# Patient Record
Sex: Female | Born: 1935
Health system: Southern US, Community
[De-identification: ages and names within clinical notes are randomized; demographics above are authoritative.]

## PROBLEM LIST (undated history)

## (undated) DIAGNOSIS — I82409 Acute embolism and thrombosis of unspecified deep veins of unspecified lower extremity: Secondary | ICD-10-CM

## (undated) DIAGNOSIS — E7800 Pure hypercholesterolemia, unspecified: Secondary | ICD-10-CM

## (undated) DIAGNOSIS — F0390 Unspecified dementia without behavioral disturbance: Secondary | ICD-10-CM

## (undated) DIAGNOSIS — C801 Malignant (primary) neoplasm, unspecified: Secondary | ICD-10-CM

## (undated) DIAGNOSIS — E119 Type 2 diabetes mellitus without complications: Secondary | ICD-10-CM

## (undated) DIAGNOSIS — I10 Essential (primary) hypertension: Secondary | ICD-10-CM

## (undated) DIAGNOSIS — Z8639 Personal history of other endocrine, nutritional and metabolic disease: Secondary | ICD-10-CM

## (undated) DIAGNOSIS — E785 Hyperlipidemia, unspecified: Secondary | ICD-10-CM

## (undated) DIAGNOSIS — I639 Cerebral infarction, unspecified: Secondary | ICD-10-CM

## (undated) HISTORY — PX: COLON SURGERY: SHX602

## (undated) HISTORY — PX: CATARACT EXTRACTION, BILATERAL: SHX1313

## (undated) HISTORY — DX: Hyperlipidemia, unspecified: E78.5

## (undated) HISTORY — DX: Unspecified dementia, unspecified severity, without behavioral disturbance, psychotic disturbance, mood disturbance, and anxiety: F03.90

## (undated) HISTORY — PX: OTHER SURGICAL HISTORY: SHX169

## (undated) HISTORY — DX: Cerebral infarction, unspecified: I63.9

## (undated) HISTORY — PX: GALLBLADDER SURGERY: SHX652

## (undated) HISTORY — DX: Essential (primary) hypertension: I10

## (undated) HISTORY — DX: Malignant (primary) neoplasm, unspecified: C80.1

## (undated) HISTORY — DX: Type 2 diabetes mellitus without complications: E11.9

## (undated) HISTORY — DX: Acute embolism and thrombosis of unspecified deep veins of unspecified lower extremity: I82.409

## (undated) HISTORY — PX: HERNIA REPAIR: SHX51

## (undated) HISTORY — DX: Pure hypercholesterolemia, unspecified: E78.00

## (undated) HISTORY — DX: Personal history of other endocrine, nutritional and metabolic disease: Z86.39

---

## 2004-11-12 ENCOUNTER — Ambulatory Visit: Payer: Self-pay | Admitting: Hematology and Oncology

## 2005-01-16 ENCOUNTER — Ambulatory Visit: Payer: Self-pay | Admitting: Hematology and Oncology

## 2005-03-13 ENCOUNTER — Ambulatory Visit: Payer: Self-pay | Admitting: Hematology and Oncology

## 2005-04-18 ENCOUNTER — Ambulatory Visit (HOSPITAL_COMMUNITY): Admission: RE | Admit: 2005-04-18 | Discharge: 2005-04-18 | Payer: Self-pay | Admitting: Hematology and Oncology

## 2005-05-09 ENCOUNTER — Ambulatory Visit: Payer: Self-pay | Admitting: Hematology and Oncology

## 2005-05-14 ENCOUNTER — Encounter: Admission: RE | Admit: 2005-05-14 | Discharge: 2005-05-14 | Payer: Self-pay | Admitting: Hematology and Oncology

## 2005-06-09 ENCOUNTER — Ambulatory Visit (HOSPITAL_COMMUNITY): Admission: RE | Admit: 2005-06-09 | Discharge: 2005-06-09 | Payer: Self-pay | Admitting: Hematology and Oncology

## 2005-07-08 ENCOUNTER — Ambulatory Visit: Payer: Self-pay | Admitting: Hematology and Oncology

## 2005-09-01 ENCOUNTER — Ambulatory Visit: Payer: Self-pay | Admitting: Hematology and Oncology

## 2005-10-21 ENCOUNTER — Ambulatory Visit: Payer: Self-pay | Admitting: Hematology and Oncology

## 2005-12-22 ENCOUNTER — Ambulatory Visit: Payer: Self-pay | Admitting: Hematology and Oncology

## 2005-12-23 LAB — CBC WITH DIFFERENTIAL/PLATELET
BASO%: 1.3 % (ref 0.0–2.0)
LYMPH%: 14 % (ref 14.0–48.0)
MCHC: 33.7 g/dL (ref 32.0–36.0)
MCV: 93 fL (ref 81.0–101.0)
MONO%: 7 % (ref 0.0–13.0)
NEUT%: 76.3 % (ref 39.6–76.8)
Platelets: 156 10*3/uL (ref 145–400)
RBC: 3.58 10*6/uL — ABNORMAL LOW (ref 3.70–5.32)

## 2005-12-23 LAB — CEA: CEA: 2.4 ng/mL (ref 0.0–5.0)

## 2005-12-23 LAB — COMPREHENSIVE METABOLIC PANEL
ALT: 11 U/L (ref 0–40)
AST: 12 U/L (ref 0–37)
BUN: 22 mg/dL (ref 6–23)
CO2: 25 mEq/L (ref 19–32)
Creatinine, Ser: 0.98 mg/dL (ref 0.40–1.20)
Total Bilirubin: 1.1 mg/dL (ref 0.3–1.2)

## 2006-02-12 ENCOUNTER — Ambulatory Visit: Payer: Self-pay | Admitting: Hematology and Oncology

## 2006-04-09 ENCOUNTER — Ambulatory Visit: Payer: Self-pay | Admitting: Hematology and Oncology

## 2006-06-18 ENCOUNTER — Ambulatory Visit: Payer: Self-pay | Admitting: Hematology and Oncology

## 2006-08-12 ENCOUNTER — Ambulatory Visit: Payer: Self-pay | Admitting: Hematology and Oncology

## 2006-09-14 ENCOUNTER — Ambulatory Visit (HOSPITAL_COMMUNITY): Admission: RE | Admit: 2006-09-14 | Discharge: 2006-09-14 | Payer: Self-pay | Admitting: Gastroenterology

## 2006-09-14 ENCOUNTER — Encounter (INDEPENDENT_AMBULATORY_CARE_PROVIDER_SITE_OTHER): Payer: Self-pay | Admitting: Specialist

## 2006-09-14 LAB — HM COLONOSCOPY

## 2006-09-24 ENCOUNTER — Ambulatory Visit: Payer: Self-pay | Admitting: Hematology and Oncology

## 2006-09-28 LAB — CBC WITH DIFFERENTIAL/PLATELET
BASO%: 0.7 % (ref 0.0–2.0)
Basophils Absolute: 0.1 10*3/uL (ref 0.0–0.1)
HCT: 39.3 % (ref 34.8–46.6)
HGB: 13.2 g/dL (ref 11.6–15.9)
LYMPH%: 17.8 % (ref 14.0–48.0)
MCHC: 33.7 g/dL (ref 32.0–36.0)
MONO#: 0.4 10*3/uL (ref 0.1–0.9)
NEUT%: 74 % (ref 39.6–76.8)
Platelets: 202 10*3/uL (ref 145–400)
WBC: 7.2 10*3/uL (ref 3.9–10.0)
lymph#: 1.3 10*3/uL (ref 0.9–3.3)

## 2006-09-29 LAB — COMPREHENSIVE METABOLIC PANEL
ALT: 22 U/L (ref 0–35)
BUN: 17 mg/dL (ref 6–23)
CO2: 27 mEq/L (ref 19–32)
Calcium: 8.7 mg/dL (ref 8.4–10.5)
Chloride: 105 mEq/L (ref 96–112)
Creatinine, Ser: 0.81 mg/dL (ref 0.40–1.20)
Glucose, Bld: 147 mg/dL — ABNORMAL HIGH (ref 70–99)
Total Bilirubin: 1 mg/dL (ref 0.3–1.2)

## 2006-09-29 LAB — LACTATE DEHYDROGENASE: LDH: 161 U/L (ref 94–250)

## 2006-09-29 LAB — CEA: CEA: 1.8 ng/mL (ref 0.0–5.0)

## 2006-11-18 ENCOUNTER — Ambulatory Visit: Payer: Self-pay | Admitting: Hematology and Oncology

## 2007-01-05 ENCOUNTER — Ambulatory Visit: Payer: Self-pay | Admitting: Hematology and Oncology

## 2007-01-05 LAB — CBC WITH DIFFERENTIAL/PLATELET
Basophils Absolute: 0 10*3/uL (ref 0.0–0.1)
EOS%: 2.9 % (ref 0.0–7.0)
Eosinophils Absolute: 0.2 10*3/uL (ref 0.0–0.5)
HCT: 36.1 % (ref 34.8–46.6)
HGB: 12.8 g/dL (ref 11.6–15.9)
LYMPH%: 17.3 % (ref 14.0–48.0)
MCH: 31.5 pg (ref 26.0–34.0)
MCV: 88.9 fL (ref 81.0–101.0)
MONO%: 7.5 % (ref 0.0–13.0)
NEUT#: 4.8 10*3/uL (ref 1.5–6.5)
NEUT%: 72 % (ref 39.6–76.8)
Platelets: 195 10*3/uL (ref 145–400)
RDW: 14.4 % (ref 11.3–14.5)
WBC: 6.7 10*3/uL (ref 3.9–10.0)

## 2007-01-05 LAB — COMPREHENSIVE METABOLIC PANEL
AST: 22 U/L (ref 0–37)
Albumin: 3.8 g/dL (ref 3.5–5.2)
Alkaline Phosphatase: 137 U/L — ABNORMAL HIGH (ref 39–117)
BUN: 15 mg/dL (ref 6–23)
Creatinine, Ser: 0.93 mg/dL (ref 0.40–1.20)
Glucose, Bld: 111 mg/dL — ABNORMAL HIGH (ref 70–99)
Potassium: 4.7 mEq/L (ref 3.5–5.3)
Total Bilirubin: 0.8 mg/dL (ref 0.3–1.2)

## 2007-01-05 LAB — CEA: CEA: 1.8 ng/mL (ref 0.0–5.0)

## 2007-03-31 ENCOUNTER — Ambulatory Visit: Payer: Self-pay | Admitting: Hematology and Oncology

## 2007-05-03 ENCOUNTER — Ambulatory Visit (HOSPITAL_COMMUNITY): Admission: RE | Admit: 2007-05-03 | Discharge: 2007-05-03 | Payer: Self-pay | Admitting: Hematology and Oncology

## 2007-05-03 LAB — CBC WITH DIFFERENTIAL/PLATELET
BASO%: 0 % (ref 0.0–2.0)
HCT: 37 % (ref 34.8–46.6)
HGB: 12.6 g/dL (ref 11.6–15.9)
LYMPH%: 13.3 % — ABNORMAL LOW (ref 14.0–48.0)
MCH: 30.7 pg (ref 26.0–34.0)
MONO%: 7.2 % (ref 0.0–13.0)
NEUT#: 5 10*3/uL (ref 1.5–6.5)
lymph#: 0.9 10*3/uL (ref 0.9–3.3)

## 2007-05-03 LAB — COMPREHENSIVE METABOLIC PANEL
ALT: 28 U/L (ref 0–35)
AST: 22 U/L (ref 0–37)
Alkaline Phosphatase: 66 U/L (ref 39–117)
Creatinine, Ser: 0.99 mg/dL (ref 0.40–1.20)
Total Bilirubin: 0.9 mg/dL (ref 0.3–1.2)

## 2007-05-03 LAB — CEA: CEA: 2.5 ng/mL (ref 0.0–5.0)

## 2007-05-14 ENCOUNTER — Ambulatory Visit: Payer: Self-pay | Admitting: Hematology and Oncology

## 2007-06-24 ENCOUNTER — Ambulatory Visit: Payer: Self-pay | Admitting: Hematology and Oncology

## 2007-07-20 ENCOUNTER — Emergency Department (HOSPITAL_COMMUNITY): Admission: EM | Admit: 2007-07-20 | Discharge: 2007-07-20 | Payer: Self-pay | Admitting: Emergency Medicine

## 2007-08-05 ENCOUNTER — Ambulatory Visit: Payer: Self-pay | Admitting: Hematology and Oncology

## 2007-09-22 ENCOUNTER — Ambulatory Visit: Payer: Self-pay | Admitting: Hematology and Oncology

## 2007-11-02 ENCOUNTER — Ambulatory Visit: Payer: Self-pay | Admitting: Hematology and Oncology

## 2007-11-04 LAB — COMPREHENSIVE METABOLIC PANEL
ALT: 16 U/L (ref 0–35)
Albumin: 3.9 g/dL (ref 3.5–5.2)
Alkaline Phosphatase: 63 U/L (ref 39–117)
CO2: 25 mEq/L (ref 19–32)
Calcium: 9 mg/dL (ref 8.4–10.5)
Total Bilirubin: 0.7 mg/dL (ref 0.3–1.2)
Total Protein: 6.6 g/dL (ref 6.0–8.3)

## 2007-11-04 LAB — CBC WITH DIFFERENTIAL/PLATELET
BASO%: 0.7 % (ref 0.0–2.0)
Basophils Absolute: 0 10*3/uL (ref 0.0–0.1)
Eosinophils Absolute: 0.2 10*3/uL (ref 0.0–0.5)
HCT: 38.9 % (ref 34.8–46.6)
LYMPH%: 13.4 % — ABNORMAL LOW (ref 14.0–48.0)
MCH: 30.6 pg (ref 26.0–34.0)
MCHC: 32.9 g/dL (ref 32.0–36.0)
MCV: 93.1 fL (ref 81.0–101.0)
MONO#: 0.5 10*3/uL (ref 0.1–0.9)
NEUT%: 76.7 % (ref 39.6–76.8)
Platelets: 198 10*3/uL (ref 145–400)
RBC: 4.18 10*6/uL (ref 3.70–5.32)
lymph#: 0.9 10*3/uL (ref 0.9–3.3)

## 2007-11-04 LAB — CEA: CEA: 2.1 ng/mL (ref 0.0–5.0)

## 2007-11-04 LAB — T4, FREE: Free T4: 1.05 ng/dL (ref 0.89–1.80)

## 2008-01-24 ENCOUNTER — Ambulatory Visit: Payer: Self-pay | Admitting: Hematology and Oncology

## 2008-02-21 LAB — HM DEXA SCAN

## 2008-02-21 LAB — HM MAMMOGRAPHY: HM Mammogram: NORMAL

## 2008-04-25 ENCOUNTER — Ambulatory Visit: Payer: Self-pay | Admitting: Hematology and Oncology

## 2008-04-27 LAB — CBC WITH DIFFERENTIAL/PLATELET
BASO%: 0.3 % (ref 0.0–2.0)
HCT: 38.3 % (ref 34.8–46.6)
HGB: 12.8 g/dL (ref 11.6–15.9)
LYMPH%: 15.5 % (ref 14.0–48.0)
MCH: 30.6 pg (ref 26.0–34.0)
MCHC: 33.5 g/dL (ref 32.0–36.0)
MCV: 91.4 fL (ref 81.0–101.0)
MONO%: 7.2 % (ref 0.0–13.0)
NEUT#: 4.4 10*3/uL (ref 1.5–6.5)
RBC: 4.19 10*6/uL (ref 3.70–5.32)
RDW: 13.9 % (ref 11.3–14.5)
WBC: 5.9 10*3/uL (ref 3.9–10.0)
lymph#: 0.9 10*3/uL (ref 0.9–3.3)

## 2008-04-27 LAB — COMPREHENSIVE METABOLIC PANEL
ALT: 21 U/L (ref 0–35)
AST: 19 U/L (ref 0–37)
Alkaline Phosphatase: 60 U/L (ref 39–117)
BUN: 16 mg/dL (ref 6–23)
Calcium: 9 mg/dL (ref 8.4–10.5)
Creatinine, Ser: 0.85 mg/dL (ref 0.40–1.20)
Potassium: 4.2 mEq/L (ref 3.5–5.3)
Total Bilirubin: 0.9 mg/dL (ref 0.3–1.2)

## 2008-04-27 LAB — CEA: CEA: 2.7 ng/mL (ref 0.0–5.0)

## 2008-05-01 ENCOUNTER — Ambulatory Visit (HOSPITAL_COMMUNITY): Admission: RE | Admit: 2008-05-01 | Discharge: 2008-05-01 | Payer: Self-pay | Admitting: Hematology and Oncology

## 2008-08-08 ENCOUNTER — Ambulatory Visit: Payer: Self-pay | Admitting: Hematology and Oncology

## 2008-10-03 ENCOUNTER — Ambulatory Visit: Payer: Self-pay | Admitting: Hematology and Oncology

## 2008-11-28 ENCOUNTER — Ambulatory Visit: Payer: Self-pay | Admitting: Hematology and Oncology

## 2009-01-23 ENCOUNTER — Ambulatory Visit: Payer: Self-pay | Admitting: Hematology and Oncology

## 2009-03-19 ENCOUNTER — Ambulatory Visit: Payer: Self-pay | Admitting: Hematology and Oncology

## 2009-05-02 ENCOUNTER — Ambulatory Visit: Payer: Self-pay | Admitting: Hematology and Oncology

## 2009-06-01 ENCOUNTER — Ambulatory Visit: Payer: Self-pay | Admitting: Hematology and Oncology

## 2009-06-21 LAB — CBC WITH DIFFERENTIAL/PLATELET
BASO%: 0.4 % (ref 0.0–2.0)
Basophils Absolute: 0 10*3/uL (ref 0.0–0.1)
Eosinophils Absolute: 0.1 10*3/uL (ref 0.0–0.5)
HCT: 40.3 % (ref 34.8–46.6)
HGB: 13.6 g/dL (ref 11.6–15.9)
LYMPH%: 17.4 % (ref 14.0–49.7)
MCH: 31.2 pg (ref 25.1–34.0)
MCV: 92.3 fL (ref 79.5–101.0)
MONO#: 0.5 10*3/uL (ref 0.1–0.9)
MONO%: 6.1 % (ref 0.0–14.0)
Platelets: 206 10*3/uL (ref 145–400)
RBC: 4.36 10*6/uL (ref 3.70–5.45)
RDW: 14.2 % (ref 11.2–14.5)
lymph#: 1.3 10*3/uL (ref 0.9–3.3)

## 2009-06-21 LAB — COMPREHENSIVE METABOLIC PANEL
ALT: 15 U/L (ref 0–35)
AST: 12 U/L (ref 0–37)
Alkaline Phosphatase: 81 U/L (ref 39–117)
BUN: 15 mg/dL (ref 6–23)
CO2: 28 mEq/L (ref 19–32)
Calcium: 9.1 mg/dL (ref 8.4–10.5)
Glucose, Bld: 158 mg/dL — ABNORMAL HIGH (ref 70–99)
Total Protein: 6.4 g/dL (ref 6.0–8.3)

## 2009-08-14 ENCOUNTER — Ambulatory Visit: Payer: Self-pay | Admitting: Hematology and Oncology

## 2009-10-12 ENCOUNTER — Ambulatory Visit: Payer: Self-pay | Admitting: Hematology and Oncology

## 2009-12-06 ENCOUNTER — Ambulatory Visit: Payer: Self-pay | Admitting: Hematology and Oncology

## 2010-01-31 ENCOUNTER — Ambulatory Visit: Payer: Self-pay | Admitting: Hematology and Oncology

## 2010-03-26 ENCOUNTER — Ambulatory Visit: Payer: Self-pay | Admitting: Hematology and Oncology

## 2010-05-21 ENCOUNTER — Ambulatory Visit: Payer: Self-pay | Admitting: Hematology and Oncology

## 2010-06-11 ENCOUNTER — Ambulatory Visit (HOSPITAL_COMMUNITY)
Admission: RE | Admit: 2010-06-11 | Discharge: 2010-06-11 | Payer: Self-pay | Source: Home / Self Care | Attending: Hematology and Oncology | Admitting: Hematology and Oncology

## 2010-06-11 LAB — CBC WITH DIFFERENTIAL/PLATELET
BASO%: 0.5 % (ref 0.0–2.0)
Basophils Absolute: 0 10*3/uL (ref 0.0–0.1)
EOS%: 2.5 % (ref 0.0–7.0)
Eosinophils Absolute: 0.1 10*3/uL (ref 0.0–0.5)
HCT: 38.2 % (ref 34.8–46.6)
HGB: 12.5 g/dL (ref 11.6–15.9)
LYMPH%: 19.3 % (ref 14.0–49.7)
MCH: 30.6 pg (ref 25.1–34.0)
MCHC: 32.7 g/dL (ref 31.5–36.0)
MCV: 93.7 fL (ref 79.5–101.0)
MONO#: 0.3 10*3/uL (ref 0.1–0.9)
MONO%: 5.3 % (ref 0.0–14.0)
NEUT#: 4.4 10*3/uL (ref 1.5–6.5)
NEUT%: 72.4 % (ref 38.4–76.8)
Platelets: 164 10*3/uL (ref 145–400)
RBC: 4.07 10*6/uL (ref 3.70–5.45)
RDW: 14.2 % (ref 11.2–14.5)
WBC: 6 10*3/uL (ref 3.9–10.3)
lymph#: 1.2 10*3/uL (ref 0.9–3.3)

## 2010-06-11 LAB — CMP (CANCER CENTER ONLY)
ALT(SGPT): 23 U/L (ref 10–47)
AST: 20 U/L (ref 11–38)
Albumin: 3.1 g/dL — ABNORMAL LOW (ref 3.3–5.5)
Alkaline Phosphatase: 59 U/L (ref 26–84)
BUN, Bld: 14 mg/dL (ref 7–22)
CO2: 30 mEq/L (ref 18–33)
Calcium: 8.8 mg/dL (ref 8.0–10.3)
Chloride: 100 mEq/L (ref 98–108)
Creat: 0.8 mg/dl (ref 0.6–1.2)
Glucose, Bld: 114 mg/dL (ref 73–118)
Potassium: 4.3 mEq/L (ref 3.3–4.7)
Sodium: 146 mEq/L — ABNORMAL HIGH (ref 128–145)
Total Bilirubin: 0.8 mg/dl (ref 0.20–1.60)
Total Protein: 6.7 g/dL (ref 6.4–8.1)

## 2010-06-15 ENCOUNTER — Other Ambulatory Visit: Payer: Self-pay | Admitting: Hematology and Oncology

## 2010-06-15 ENCOUNTER — Encounter: Payer: Self-pay | Admitting: Hematology and Oncology

## 2010-06-15 DIAGNOSIS — C20 Malignant neoplasm of rectum: Secondary | ICD-10-CM

## 2010-06-16 ENCOUNTER — Encounter: Payer: Self-pay | Admitting: Family Medicine

## 2010-06-16 ENCOUNTER — Encounter: Payer: Self-pay | Admitting: Hematology and Oncology

## 2010-07-03 ENCOUNTER — Encounter (HOSPITAL_BASED_OUTPATIENT_CLINIC_OR_DEPARTMENT_OTHER): Payer: MEDICARE | Admitting: Hematology and Oncology

## 2010-07-03 DIAGNOSIS — C20 Malignant neoplasm of rectum: Secondary | ICD-10-CM

## 2010-07-03 DIAGNOSIS — Z452 Encounter for adjustment and management of vascular access device: Secondary | ICD-10-CM

## 2010-08-14 ENCOUNTER — Encounter (HOSPITAL_BASED_OUTPATIENT_CLINIC_OR_DEPARTMENT_OTHER): Payer: MEDICARE | Admitting: Hematology and Oncology

## 2010-08-14 DIAGNOSIS — Z452 Encounter for adjustment and management of vascular access device: Secondary | ICD-10-CM

## 2010-08-14 DIAGNOSIS — C20 Malignant neoplasm of rectum: Secondary | ICD-10-CM

## 2010-10-11 NOTE — Op Note (Signed)
NAMEELLIONA, DODDRIDGE                  ACCOUNT NO.:  [id number]   MEDICAL RECORD NO.:  [id number]          PATIENT TYPE:  AMB   LOCATION:  ENDO                         FACILITY:  [facility]   PHYSICIAN:  Anselmo Rod, M.D.  DATE OF BIRTH:  27-Feb-1934   DATE OF PROCEDURE:  09/14/2006  DATE OF DISCHARGE:                               OPERATIVE REPORT   PROCEDURE PERFORMED:  Screening colonoscopy.   ENDOSCOPIST:  Anselmo Rod, M.D.   INSTRUMENT USED:  Pentax video colonoscope.   INDICATIONS FOR PROCEDURE:  A 77 year old female with a personal history  of rectal cancer, status post low anterior resection, undergoing a  surveillance colonoscopy.  Her colon cancer was initially diagnosed in  2004 when she had surgery in [place].  Rule out recurrent polyps,  masses, etc.   PRE-PROCEDURE PREPARATION:  Informed consent was procured from the  patient.  The patient was fasted for eight hours prior to the procedure  and prepped with a gallon of NuLytely the night prior to the procedure.  She also received a bottle of magnesium citrate along with a gallon of  NuLytely.  Risks and benefits of the procedure, including a 10% missed  rate of cancer and polyps, were discussed with the patient as well.   PRE-PROCEDURE PHYSICAL EXAMINATION:  VITAL SIGNS:  Stable vital signs.  NECK:  Supple.  CHEST:  Clear to auscultation.  HEART:  S1 and S2.  Regular.  ABDOMEN:  Obese with normal bowel sounds.   DESCRIPTION OF PROCEDURE:  The patient was placed in the left lateral  decubitus position and sedated with 50 mcg of Fentanyl and 5 mg of  Versed given intravenously in slow, incremental doses.  Once the patient  was adequately sedated and maintained on low flow oxygen and continuous  cardiac monitoring, the Pentax video colonoscope was advanced from the  rectum to the cecum, where healthy anastomosis was seen at 5 cm.  A  small sessile polyp was removed via snare polypectomy at 6 cm.  There  was a large  amount of residual stool in the colon.  The prep was poor.  There were a few early scattered diverticula noted.  Retroflexion in the  rectum revealed no abnormalities.  The patient tolerated the procedure  well without immediate complications.   IMPRESSION:  1. A few scattered diverticula.  2. A small sessile polyp, removed by a snare polypectomy at 6 cm.  3. Healthy anastomosis at 5 cm.  4. A large amount of residual stool in the colon.  Small lesions could      be missed.   RECOMMENDATIONS:  1. Await pathology results.  2. Avoid all nonsteroidals, including aspirin, for the next two weeks.  3. Repeat colonoscopy, depending on pathology results.  4. Outpatient followup as the needs arise in the future.  The      importance of a high-fiber diet has been discussed with the      patient.      Anselmo Rod, M.D.  Electronically Signed     JNM/MEDQ  D:  09/15/2006  T:  09/16/2006  Job:  [id number]   cc:   Ernestina Penna, M.D.  Lauretta I. Odogwu, M.D.

## 2010-10-14 ENCOUNTER — Encounter (HOSPITAL_BASED_OUTPATIENT_CLINIC_OR_DEPARTMENT_OTHER): Payer: Medicare Other | Admitting: Hematology and Oncology

## 2010-10-14 DIAGNOSIS — C20 Malignant neoplasm of rectum: Secondary | ICD-10-CM

## 2010-10-14 DIAGNOSIS — Z452 Encounter for adjustment and management of vascular access device: Secondary | ICD-10-CM

## 2010-10-17 ENCOUNTER — Other Ambulatory Visit: Payer: Self-pay | Admitting: Hematology and Oncology

## 2010-10-17 DIAGNOSIS — C20 Malignant neoplasm of rectum: Secondary | ICD-10-CM

## 2010-10-31 ENCOUNTER — Ambulatory Visit (HOSPITAL_COMMUNITY)
Admission: RE | Admit: 2010-10-31 | Discharge: 2010-10-31 | Disposition: A | Discharge status: Home / Self Care | Payer: Medicare Other | Source: Ambulatory Visit | Attending: Hematology and Oncology | Admitting: Hematology and Oncology

## 2010-10-31 DIAGNOSIS — Z452 Encounter for adjustment and management of vascular access device: Secondary | ICD-10-CM | POA: Insufficient documentation

## 2010-10-31 DIAGNOSIS — C20 Malignant neoplasm of rectum: Secondary | ICD-10-CM

## 2010-10-31 DIAGNOSIS — C189 Malignant neoplasm of colon, unspecified: Secondary | ICD-10-CM | POA: Insufficient documentation

## 2010-10-31 LAB — CBC
HCT: 41.2 % (ref 36.0–46.0)
MCV: 93.8 fL (ref 78.0–100.0)
RDW: 13.6 % (ref 11.5–15.5)

## 2011-02-14 LAB — POCT I-STAT CREATININE: Operator id: 284251

## 2011-02-14 LAB — I-STAT 8, (EC8 V) (CONVERTED LAB)
Acid-Base Excess: 2
Bicarbonate: 27.4 — ABNORMAL HIGH
TCO2: 29
pCO2, Ven: 44.3 — ABNORMAL LOW
pH, Ven: 7.399 — ABNORMAL HIGH

## 2011-02-14 LAB — CBC
HCT: 39.8
Hemoglobin: 13.4
MCHC: 33.7
MCV: 91.6
Platelets: 209
RBC: 4.35
RDW: 13.7
WBC: 6.9

## 2011-02-14 LAB — DIFFERENTIAL
Basophils Absolute: 0
Basophils Relative: 0
Eosinophils Absolute: 0.1
Eosinophils Relative: 2
Lymphocytes Relative: 12
Lymphs Abs: 0.8
Monocytes Absolute: 0.4
Monocytes Relative: 6
Neutro Abs: 5.5
Neutrophils Relative %: 80 — ABNORMAL HIGH

## 2011-02-14 LAB — URINALYSIS, ROUTINE W REFLEX MICROSCOPIC
Bilirubin Urine: NEGATIVE
Glucose, UA: NEGATIVE
Hgb urine dipstick: NEGATIVE
Ketones, ur: NEGATIVE
Nitrite: NEGATIVE
Protein, ur: NEGATIVE
Specific Gravity, Urine: 1.015
Urobilinogen, UA: 1
pH: 7.5

## 2011-05-26 ENCOUNTER — Telehealth: Payer: Self-pay | Admitting: Hematology and Oncology

## 2011-05-26 NOTE — Telephone Encounter (Signed)
called pts home lmovm that her appt on 01/18 was r/s to 02/05 and to rtn call to me to confirm appt

## 2011-06-10 ENCOUNTER — Inpatient Hospital Stay (HOSPITAL_COMMUNITY): Admission: RE | Admit: 2011-06-10 | Payer: Self-pay | Source: Ambulatory Visit

## 2011-06-10 ENCOUNTER — Other Ambulatory Visit: Payer: Medicare Other

## 2011-06-23 ENCOUNTER — Encounter: Payer: Self-pay | Admitting: Nurse Practitioner

## 2011-07-01 ENCOUNTER — Ambulatory Visit: Payer: Medicare Other | Admitting: Hematology and Oncology

## 2012-09-02 ENCOUNTER — Telehealth: Payer: Self-pay | Admitting: Family Medicine

## 2012-09-02 NOTE — Telephone Encounter (Signed)
Daughter wants Leler Iribe to see neuro at [facility], stating pt seems to be disoriented at times and hands shaky. Number to call is [phone]. °

## 2012-09-02 NOTE — Telephone Encounter (Signed)
I am fine with that but I'd like to see her tomorrow to begin the necessary work up (TSH, B12, MRI of brain to evaluate for stroke) that way they have the info when they see her.  NTBS tomorrow or next week.

## 2012-09-02 NOTE — Telephone Encounter (Signed)
Daughter wants Terika Fangman to see neuro at [facility], stating pt seems to be disoriented at times and hands shaky. Number to call is [phone].

## 2012-09-02 NOTE — Telephone Encounter (Signed)
Pt aware and daughter will getting in touch with Jaquelinne and give [place] a call to make appt./SS

## 2012-09-06 NOTE — Telephone Encounter (Signed)
Pt has appt with [facility] set up

## 2012-09-21 ENCOUNTER — Encounter: Payer: Self-pay | Admitting: Family Medicine

## 2012-09-21 ENCOUNTER — Ambulatory Visit (INDEPENDENT_AMBULATORY_CARE_PROVIDER_SITE_OTHER): Payer: Medicare Other | Admitting: Family Medicine

## 2012-09-21 VITALS — BP 132/78 | HR 92 | Temp 98.1°F | Resp 20 | Wt 286.0 lb

## 2012-09-21 DIAGNOSIS — L0293 Carbuncle, unspecified: Secondary | ICD-10-CM

## 2012-09-21 DIAGNOSIS — E1122 Type 2 diabetes mellitus with diabetic chronic kidney disease: Secondary | ICD-10-CM | POA: Insufficient documentation

## 2012-09-21 DIAGNOSIS — G459 Transient cerebral ischemic attack, unspecified: Secondary | ICD-10-CM

## 2012-09-21 DIAGNOSIS — E785 Hyperlipidemia, unspecified: Secondary | ICD-10-CM

## 2012-09-21 DIAGNOSIS — L0292 Furuncle, unspecified: Secondary | ICD-10-CM

## 2012-09-21 DIAGNOSIS — I10 Essential (primary) hypertension: Secondary | ICD-10-CM

## 2012-09-21 DIAGNOSIS — R413 Other amnesia: Secondary | ICD-10-CM

## 2012-09-21 LAB — HEPATIC FUNCTION PANEL
AST: 13 U/L (ref 0–37)
Albumin: 3.9 g/dL (ref 3.5–5.2)
Alkaline Phosphatase: 90 U/L (ref 39–117)
Indirect Bilirubin: 0.8 mg/dL (ref 0.0–0.9)
Total Bilirubin: 0.9 mg/dL (ref 0.3–1.2)
Total Protein: 6.8 g/dL (ref 6.0–8.3)

## 2012-09-21 LAB — LIPID PANEL
LDL Cholesterol: 190 mg/dL — ABNORMAL HIGH (ref 0–99)
Total CHOL/HDL Ratio: 6.1 Ratio

## 2012-09-21 LAB — BASIC METABOLIC PANEL
BUN: 14 mg/dL (ref 6–23)
CO2: 25 mEq/L (ref 19–32)
Chloride: 103 mEq/L (ref 96–112)
Creat: 0.74 mg/dL (ref 0.50–1.10)
Potassium: 4.3 mEq/L (ref 3.5–5.3)

## 2012-09-21 MED ORDER — SULFAMETHOXAZOLE-TRIMETHOPRIM 800-160 MG PO TABS: 1.0000 | ORAL_TABLET | Freq: Two times a day (BID) | ORAL | Status: DC

## 2012-09-21 MED ORDER — LISINOPRIL 20 MG PO TABS: 20.0000 mg | ORAL_TABLET | Freq: Every day | ORAL | Status: DC

## 2012-09-21 NOTE — Progress Notes (Signed)
Subjective:    Patient ID: Christine Zavala, female    DOB: August 18, 1933, 79 y.o.   MRN: [id number]  HPI Patient is accompanied by her daughter. There is some concern that the patient is having significant memory loss.  This seems to be a sudden change. For instance, 2 weeks ago the patient tried to write her name, but could not think of how to write her name.  Furthermore she is forgetting to take her medicines. She discontinued metformin accident.  In September due to the elevated LDL, we would switch her from pravastatin to Crestor. The patient never went to get that prescription because she forgot. She forgetting names. She is become easily confused.  The daughter is concerned she may be having mini strokes.  She has a history of allergies to aspirin.  Her risk factors for strokes including hypertension, diabetes mellitus, hyperlipidemia. She is not checking her sugars. Her last hemoglobin A1c was 6.9 in September of 2013 .  Her blood pressure is fairly well controlled. Her cholesterol her last office visit was significant for an LDL of 176.    She also has a boil in her left groin. It is red, sore, and draining purulent material. His approximate 1.5 cm in size. There is a 0.5 cm at the which is draining yellow material in the center. Past Medical History  Diagnosis Date  . Cancer     Rectal  . Osteoporosis   . DVT (deep venous thrombosis)   . Cataract   . Hyperlipidemia   . Hypertension   . Diabetes mellitus without complication    Current Outpatient Prescriptions on File Prior to Visit  Medication Sig Dispense Refill  . furosemide (LASIX) 40 MG tablet Take 40 mg by mouth 2 (two) times daily.      . potassium chloride SA (K-DUR,KLOR-CON) 20 MEQ tablet Take 20 mEq by mouth daily.      . pravastatin (PRAVACHOL) 10 MG tablet Take 10 mg by mouth 2 (two) times daily.      . vitamin B-12 (CYANOCOBALAMIN) 1000 MCG tablet Take 1,000 mcg by mouth daily.       No current facility-administered  medications on file prior to visit.   Allergies  Allergen Reactions  . Aspirin Nausea And Vomiting  . Codeine Sulfate Nausea And Vomiting   History   Social History  . Marital Status: Widowed    Spouse Name: N/A    Number of Children: N/A  . Years of Education: N/A   Occupational History  . Not on file.   Social History Main Topics  . Smoking status: Never Smoker   . Smokeless tobacco: Not on file  . Alcohol Use: No  . Drug Use: Not on file  . Sexually Active: Not on file   Other Topics Concern  . Not on file   Social History Narrative  . No narrative on file      Review of Systems Remainder of review of systems is negative.    Objective:   Physical Exam  Constitutional: She is oriented to person, place, and time. She appears well-developed and well-nourished.  Cardiovascular: Normal rate, regular rhythm and normal heart sounds.   No murmur heard. Pulmonary/Chest: Effort normal and breath sounds normal. No respiratory distress. She has no wheezes. She has no rales. She exhibits no tenderness.  Abdominal: Soft. Bowel sounds are normal. She exhibits no distension and no mass. There is no tenderness. There is no rebound and no guarding.  Neurological: She is alert  and oriented to person, place, and time. She has normal reflexes. She displays normal reflexes. No cranial nerve deficit. She exhibits normal muscle tone. Coordination normal.   there is no apparent cranial nerve deficit. She has a slight left pronator drift. There is a boil in her left groin as described above.        Assessment & Plan:  1. HLD (hyperlipidemia) Check fasting lipid panel, goal LDL is less than 70. - Hepatic Function Panel - Lipid Panel - MR Brain W Wo Contrast; Future  2. Type II or unspecified type diabetes mellitus without mention of complication, uncontrolled Goal A1c is less than 6.5, check hemoglobin A1c - Basic Metabolic Panel - Hemoglobin A1c - Hepatic Function Panel -  Lipid Panel - MR Brain W Wo Contrast; Future  3. Boil Bactrim double strength tablets 1 by mouth twice a day for 10 days  4. HTN (hypertension) Blood pressure is currently well controlled, continue present medications at present does - MR Brain W Wo Contrast; Future  5. Memory loss This could reflect dementia versus vascular dementia. Obtain MRI of the brain. If there is evidence of recent cerebral infarction, she will need a vascular workup including carotid Dopplers, echocardiogram of the heart. Also start patient on Plavix 75 mg by mouth daily given her history of an allergy to aspirin.  If there is no evidence of a CVA, would treat the patient as dementia with Aricept and namenda. - MR Brain W Wo Contrast; Future  6. TIA (transient ischemic attack) Her symptoms are not classic for TIA. Forgetting how to write her name could represent aphasia.  However it could also just be a symptom of her dementia. Await results of her MRI.  If MRIs showed silent CVAs, she will need a vascular workup as I have discussed earlier.  - MR Brain W Wo Contrast; Future

## 2012-09-22 ENCOUNTER — Other Ambulatory Visit: Payer: Self-pay | Admitting: Family Medicine

## 2012-09-22 MED ORDER — ROSUVASTATIN CALCIUM 40 MG PO TABS: 40.0000 mg | ORAL_TABLET | Freq: Every day | ORAL | Status: DC

## 2012-09-22 MED ORDER — METFORMIN HCL 500 MG PO TABS: 1000.0000 mg | ORAL_TABLET | Freq: Two times a day (BID) | ORAL | Status: DC

## 2012-09-22 NOTE — Telephone Encounter (Signed)
Rx Refilled  

## 2012-09-23 ENCOUNTER — Other Ambulatory Visit: Payer: Self-pay | Admitting: Family Medicine

## 2012-09-26 ENCOUNTER — Ambulatory Visit
Admission: RE | Admit: 2012-09-26 | Discharge: 2012-09-26 | Disposition: A | Discharge status: Home / Self Care | Payer: Medicare Other | Source: Ambulatory Visit | Attending: Family Medicine | Admitting: Family Medicine

## 2012-09-26 DIAGNOSIS — E785 Hyperlipidemia, unspecified: Secondary | ICD-10-CM

## 2012-09-26 DIAGNOSIS — G459 Transient cerebral ischemic attack, unspecified: Secondary | ICD-10-CM

## 2012-09-26 DIAGNOSIS — R413 Other amnesia: Secondary | ICD-10-CM

## 2012-09-26 DIAGNOSIS — I10 Essential (primary) hypertension: Secondary | ICD-10-CM

## 2012-09-26 MED ORDER — GADOBENATE DIMEGLUMINE 529 MG/ML IV SOLN
20.0000 mL | Freq: Once | INTRAVENOUS | Status: AC | PRN
  Administered 2012-09-26: 20 mL via INTRAVENOUS

## 2012-09-27 ENCOUNTER — Other Ambulatory Visit: Payer: Self-pay | Admitting: Family Medicine

## 2012-09-27 DIAGNOSIS — R413 Other amnesia: Secondary | ICD-10-CM

## 2012-09-27 DIAGNOSIS — I639 Cerebral infarction, unspecified: Secondary | ICD-10-CM

## 2012-09-27 MED ORDER — LISINOPRIL 40 MG PO TABS: 40.0000 mg | ORAL_TABLET | Freq: Every day | ORAL | Status: DC

## 2012-09-27 MED ORDER — CLOPIDOGREL BISULFATE 75 MG PO TABS: 75.0000 mg | ORAL_TABLET | Freq: Every day | ORAL | Status: DC

## 2012-09-27 MED ORDER — DONEPEZIL HCL 5 MG PO TABS: 5.0000 mg | ORAL_TABLET | Freq: Every evening | ORAL | Status: DC | PRN

## 2012-09-27 NOTE — Telephone Encounter (Signed)
Rx Refilled  

## 2012-09-29 ENCOUNTER — Telehealth: Payer: Self-pay | Admitting: Family Medicine

## 2012-09-30 ENCOUNTER — Telehealth: Payer: Self-pay | Admitting: Family Medicine

## 2012-09-30 NOTE — Telephone Encounter (Signed)
Not likely due to Plavix. Aricept can cause some stomach upset. Try taking the Aricept at night with food. Metformin and Crestor should not cause nausea and vomiting. Metformin may cause diarrhea.

## 2012-09-30 NOTE — Telephone Encounter (Signed)
.  Patient aware and will continue to take meds and if it does not improve in a 1 week she will call us back

## 2012-10-06 ENCOUNTER — Telehealth: Payer: Self-pay | Admitting: *Deleted

## 2012-10-19 ENCOUNTER — Encounter: Payer: Self-pay | Admitting: Neurology

## 2012-10-19 ENCOUNTER — Ambulatory Visit (INDEPENDENT_AMBULATORY_CARE_PROVIDER_SITE_OTHER): Payer: Medicare Other | Admitting: Neurology

## 2012-10-19 VITALS — BP 153/76 | HR 85 | Ht 64.0 in | Wt 274.0 lb

## 2012-10-19 DIAGNOSIS — I6350 Cerebral infarction due to unspecified occlusion or stenosis of unspecified cerebral artery: Secondary | ICD-10-CM

## 2012-10-19 DIAGNOSIS — Z85048 Personal history of other malignant neoplasm of rectum, rectosigmoid junction, and anus: Secondary | ICD-10-CM | POA: Insufficient documentation

## 2012-10-19 DIAGNOSIS — F0390 Unspecified dementia without behavioral disturbance: Secondary | ICD-10-CM

## 2012-10-19 DIAGNOSIS — C801 Malignant (primary) neoplasm, unspecified: Secondary | ICD-10-CM

## 2012-10-19 DIAGNOSIS — I639 Cerebral infarction, unspecified: Secondary | ICD-10-CM

## 2012-10-19 DIAGNOSIS — E785 Hyperlipidemia, unspecified: Secondary | ICD-10-CM | POA: Insufficient documentation

## 2012-10-19 DIAGNOSIS — E7800 Pure hypercholesterolemia, unspecified: Secondary | ICD-10-CM

## 2012-10-19 DIAGNOSIS — Z8673 Personal history of transient ischemic attack (TIA), and cerebral infarction without residual deficits: Secondary | ICD-10-CM | POA: Insufficient documentation

## 2012-10-19 NOTE — Progress Notes (Signed)
HPI: Christine Zavala is a 79 years old right-handed Caucasian female, referred by her primary care physician Dr. Lynnea Ferrier,  accompanied by her daughter at today's clinical visit.  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  She was noticed by her family, and also herself notice that she has mild memory trouble for 7 years, difficulty focusing, difficulty with people's name,  Her memory trouble gradually getting worse, she had 14 years of education, retired at [employer] at age 79 due to her multiple joints pain, She complains of bilateral hip pain, knee pain, gait difficulty,  She was referred for MRI of the brain, which reviewed the film together, there was left frontal subcortical chronic stroke, no acute lesions, she is taking Plavix, no strokelike events   Review of Systems  Out of a complete 14 system review, the patient complains of only the following symptoms, and all other reviewed systems are negative.   Constitutional:   Weight loss, fatigue Cardiovascular:  Swelling in legs Ear/Nose/Throat:  N/A Skin: N/A Eyes: blurry vision Respiratory: N/A Gastroitestinal:  Incontinence, constipation   Hematology/Lymphatic:  N/A Endocrine:  N/A Musculoskeletal: joints pain, swelling Allergy/Immunology: N/A Neurological:  Memory loss, confusion, numbness, weakness, dizziness, sleepiness, Psychiatric:    Not enough sleep, change in appetite, disinterested in activities,  PHYSICAL EXAMINATOINS:  Generalized: In no acute distress  Neck: Supple, no carotid bruits   Cardiac: Regular rate rhythm  Pulmonary: Clear to auscultation bilaterally  Musculoskeletal: No  deformity  Neurological examination  Mentation: Alert oriented to time, place, history taking, and causual conversation, obese, Mini-Mental Status Exmination is 29 out of 30,  she missed 1 out of 3 recalls, animal naming was 8,  Cranial nerve II-XII: Pupils were equal round reactive to light extraocular movements were full, visual field were full on confrontational test. facial sensation and strength were normal. hearing was intact to finger rubbing bilaterally. Uvula tongue midline.  head turning and shoulder shrug and were normal and symmetric.Tongue protrusion into cheek strength was normal.  Motor: normal tone, bulk and strength.  Sensory: Intact to fine touch, pinprick, preserved vibratory sensation, and proprioception at toes.  Coordination: Normal finger to nose, heel-to-shin bilaterally there was no truncal ataxia  Gait: Rising up from seated position by pushing on chair arm, unsteady wide based, wobbling gait, she could not perform tiptoe, heel, and tandem walking  Romberg signs: Negative  Deep tendon reflexes: Brachioradialis 2/2, biceps 2/2, triceps 2/2, patellar 2/2, Achilles trace, plantar responses were flexor bilaterally.  Assessment and plan:  79 years old right-handed Caucasian female, with past medical history of obesity, hypertension, hyperlipidemia, diabetes, colon cancer, status post chemoradiation, and resection surgery, family history of dementia, she presented with memory trouble, also gait difficulty, MRI showed left frontal subcortical chronic stroke  1 her gait difficulty multifactor, including obesity, sedentary lifestyle, deconditioning, 2 home physical therapy 3 complete evaluation with ultrasound of carotid artery, echocardiogram, 4 continue Plavix, address vascular risk factor.

## 2012-10-20 NOTE — Telephone Encounter (Signed)
Referral scheduled.

## 2012-10-22 ENCOUNTER — Telehealth: Payer: Self-pay | Admitting: Family Medicine

## 2012-10-22 MED ORDER — BLOOD GLUCOSE MONITOR KIT: PACK | Status: DC

## 2012-10-22 NOTE — Telephone Encounter (Signed)
Rx Refilled  

## 2012-10-26 ENCOUNTER — Telehealth: Payer: Self-pay | Admitting: Neurology

## 2012-11-02 ENCOUNTER — Telehealth: Payer: Self-pay | Admitting: Neurology

## 2012-11-02 ENCOUNTER — Ambulatory Visit (INDEPENDENT_AMBULATORY_CARE_PROVIDER_SITE_OTHER): Payer: Medicare Other

## 2012-11-02 DIAGNOSIS — I6350 Cerebral infarction due to unspecified occlusion or stenosis of unspecified cerebral artery: Secondary | ICD-10-CM

## 2012-11-02 DIAGNOSIS — E7800 Pure hypercholesterolemia, unspecified: Secondary | ICD-10-CM

## 2012-11-02 DIAGNOSIS — C801 Malignant (primary) neoplasm, unspecified: Secondary | ICD-10-CM

## 2012-11-02 DIAGNOSIS — F0390 Unspecified dementia without behavioral disturbance: Secondary | ICD-10-CM

## 2012-11-02 DIAGNOSIS — I639 Cerebral infarction, unspecified: Secondary | ICD-10-CM

## 2012-11-02 NOTE — Telephone Encounter (Signed)
Returned call, no answer. Left vmail.  

## 2012-11-03 ENCOUNTER — Encounter: Payer: Self-pay | Admitting: Family Medicine

## 2012-11-03 ENCOUNTER — Telehealth: Payer: Self-pay | Admitting: Neurology

## 2012-11-03 ENCOUNTER — Ambulatory Visit (INDEPENDENT_AMBULATORY_CARE_PROVIDER_SITE_OTHER): Payer: Medicare Other | Admitting: Family Medicine

## 2012-11-03 VITALS — BP 120/60 | HR 64 | Temp 97.6°F | Resp 16 | Wt 270.0 lb

## 2012-11-03 DIAGNOSIS — E119 Type 2 diabetes mellitus without complications: Secondary | ICD-10-CM

## 2012-11-03 DIAGNOSIS — F0390 Unspecified dementia without behavioral disturbance: Secondary | ICD-10-CM

## 2012-11-03 DIAGNOSIS — R5381 Other malaise: Secondary | ICD-10-CM

## 2012-11-03 DIAGNOSIS — R110 Nausea: Secondary | ICD-10-CM

## 2012-11-03 DIAGNOSIS — I10 Essential (primary) hypertension: Secondary | ICD-10-CM

## 2012-11-03 DIAGNOSIS — R5383 Other fatigue: Secondary | ICD-10-CM | POA: Insufficient documentation

## 2012-11-03 DIAGNOSIS — IMO0001 Reserved for inherently not codable concepts without codable children: Secondary | ICD-10-CM

## 2012-11-03 DIAGNOSIS — M791 Myalgia, unspecified site: Secondary | ICD-10-CM

## 2012-11-03 DIAGNOSIS — E559 Vitamin D deficiency, unspecified: Secondary | ICD-10-CM

## 2012-11-03 DIAGNOSIS — R634 Abnormal weight loss: Secondary | ICD-10-CM

## 2012-11-03 NOTE — Assessment & Plan Note (Addendum)
I will decrease her to 1500 mg total. She was on 1000mg  daily, see instructions

## 2012-11-03 NOTE — Assessment & Plan Note (Signed)
Will hold aricept based on pt concerns with new medication, plavix will be continued as old stroke noted with likely vascular causes of dementia

## 2012-11-03 NOTE — Assessment & Plan Note (Signed)
Is very difficult to tell which medications are causing her nausea that she has many which have that side effect. She truly thinks that the Aricept causes a lot of upset stomach. I will go ahead and stop the Aricept right now she will followup with her PCP in one week. She's not had any emesis. Metformin can also cause some GI side effects other mostly diarrhea. CBC and see met will be obtained

## 2012-11-03 NOTE — Assessment & Plan Note (Signed)
Her myalgias may be multifactorial. Since she was changed from pravastatin to Crestor I will have her hold this for right now. Will check a CK levels as well as renal function liver function

## 2012-11-03 NOTE — Patient Instructions (Addendum)
Stop the crestor  Take 1/2 tablet in the morning of Metformin , take 1 at bedtime Continue plavix Stop  aricept Blood work done  Keep drinking fluids  F/U 1 week

## 2012-11-03 NOTE — Assessment & Plan Note (Addendum)
See above notes, Metformin can also cause the weight loss Labs to be done  History of rectal cancer Changes per above, appetite has also been low contributing to weight loss

## 2012-11-03 NOTE — Assessment & Plan Note (Signed)
Blood pressure rechecked in office is at her baseline. I will continue the current dose of lisinopril

## 2012-11-03 NOTE — Progress Notes (Signed)
  Subjective:    Patient ID: Christine Zavala, female    DOB: 03/13/1934, 79 y.o.   MRN: [id number]  HPI Patient here secondary to increased fatigue muscle aches  nausea. She thinks this is due to her medication changes recently. At the end of April she was seen by her PCP. She was found to have changes in her mentation and cognition concerned for dementia MRI showed old stroke. She was started on Plavix and Aricept. She is currently being seen by neurology as well for dementia. Blood work was obtained which showed uncontrolled diabetes mellitus her metformin was increased to 1000 mg twice a day. She states her blood sugars have been on the lower in in the low 100s, but denies any hypoglycemia or diarrhea. Her fasting lipid panel was also extremely elevated and she was changed from pravastatin to Crestor. She states she is so fatigued she is unable to get up and move around her house at her baseline. Her appetite is also decreased and she has lost 16 pounds since the end of April which is been about 6 weeks. Muscles aches are mostly at night and in early AM Denies any fall  Review of Systems  GEN- +fatigue, fever, weight loss,weakness, recent illness HEENT- denies eye drainage, change in vision, nasal discharge, CVS- denies chest pain, palpitations RESP- denies SOB, cough, wheeze ABD- + N/denies V , change in stools, abd pain GU- denies dysuria, hematuria, dribbling, incontinence MSK- denies joint pain,+ muscle aches, injury Neuro- denies headache, dizziness, syncope, seizure activity      Objective:   Physical Exam GEN- NAD, alert and orientedx3, elderly female HEENT- PERRL, EOMI, non injected sclera, pink conjunctiva, MMM, oropharynx clear Neck- Supple, no LAD CVS- RRR, no murmur RESP-CTAB ABD-NABS,soft,NT,ND, multiple healed surgical scars EXT- trace pedal edema, non tender to palpation of LE Pulses- Radial 2+ DP equal bilat NEURO-CNII-XII in tact, ambulates slowly and cautiously with cane,  no shuffling feet, normal tone LE and UE, no rigidity,          Assessment & Plan:

## 2012-11-03 NOTE — Assessment & Plan Note (Signed)
Her above based on her history this is likely multiple factorial however she has had multiple changes in her medications. This could be from the increased dose of metformin which is due to cause nausea as well as the lower blood sugars which she may not be used to. We will continue the Plavix. B12 levels will be checked as well as vitamin D at patient's request

## 2012-11-04 LAB — COMPREHENSIVE METABOLIC PANEL
ALT: 79 U/L — ABNORMAL HIGH (ref 0–35)
Albumin: 4 g/dL (ref 3.5–5.2)
Alkaline Phosphatase: 72 U/L (ref 39–117)
Glucose, Bld: 135 mg/dL — ABNORMAL HIGH (ref 70–99)
Potassium: 4.3 mEq/L (ref 3.5–5.3)
Sodium: 139 mEq/L (ref 135–145)
Total Bilirubin: 0.8 mg/dL (ref 0.3–1.2)
Total Protein: 6.3 g/dL (ref 6.0–8.3)

## 2012-11-04 LAB — CBC WITH DIFFERENTIAL/PLATELET
Basophils Absolute: 0 10*3/uL (ref 0.0–0.1)
Basophils Relative: 0 % (ref 0–1)
Eosinophils Absolute: 0.1 10*3/uL (ref 0.0–0.7)
Eosinophils Relative: 1 % (ref 0–5)
Lymphs Abs: 1.4 10*3/uL (ref 0.7–4.0)
MCH: 28.7 pg (ref 26.0–34.0)
MCHC: 32.6 g/dL (ref 30.0–36.0)
MCV: 88.1 fL (ref 78.0–100.0)
Neutrophils Relative %: 79 % — ABNORMAL HIGH (ref 43–77)
Platelets: 270 10*3/uL (ref 150–400)
RBC: 4.11 MIL/uL (ref 3.87–5.11)
RDW: 14.9 % (ref 11.5–15.5)

## 2012-11-04 LAB — CK: Total CK: 1304 U/L — ABNORMAL HIGH (ref 7–177)

## 2012-11-11 ENCOUNTER — Ambulatory Visit: Payer: Medicare Other | Admitting: Family Medicine

## 2012-11-15 ENCOUNTER — Ambulatory Visit (INDEPENDENT_AMBULATORY_CARE_PROVIDER_SITE_OTHER): Payer: Medicare Other | Admitting: Family Medicine

## 2012-11-15 ENCOUNTER — Encounter: Payer: Self-pay | Admitting: Family Medicine

## 2012-11-15 VITALS — BP 110/60 | HR 68 | Temp 97.4°F | Resp 18 | Ht 62.0 in | Wt 270.0 lb

## 2012-11-15 DIAGNOSIS — F0390 Unspecified dementia without behavioral disturbance: Secondary | ICD-10-CM

## 2012-11-15 DIAGNOSIS — E119 Type 2 diabetes mellitus without complications: Secondary | ICD-10-CM

## 2012-11-15 DIAGNOSIS — R7989 Other specified abnormal findings of blood chemistry: Secondary | ICD-10-CM

## 2012-11-15 DIAGNOSIS — N179 Acute kidney failure, unspecified: Secondary | ICD-10-CM

## 2012-11-15 DIAGNOSIS — R600 Localized edema: Secondary | ICD-10-CM | POA: Insufficient documentation

## 2012-11-15 DIAGNOSIS — I10 Essential (primary) hypertension: Secondary | ICD-10-CM

## 2012-11-15 DIAGNOSIS — R748 Abnormal levels of other serum enzymes: Secondary | ICD-10-CM

## 2012-11-15 DIAGNOSIS — M791 Myalgia, unspecified site: Secondary | ICD-10-CM

## 2012-11-15 DIAGNOSIS — R609 Edema, unspecified: Secondary | ICD-10-CM | POA: Insufficient documentation

## 2012-11-15 DIAGNOSIS — M6282 Rhabdomyolysis: Secondary | ICD-10-CM

## 2012-11-15 DIAGNOSIS — E7800 Pure hypercholesterolemia, unspecified: Secondary | ICD-10-CM

## 2012-11-15 LAB — TSH: TSH: 1.784 u[IU]/mL (ref 0.350–4.500)

## 2012-11-15 LAB — CK: Total CK: 47 U/L (ref 7–177)

## 2012-11-15 LAB — COMPREHENSIVE METABOLIC PANEL
AST: 13 U/L (ref 0–37)
Albumin: 3.6 g/dL (ref 3.5–5.2)
BUN: 17 mg/dL (ref 6–23)
CO2: 23 mEq/L (ref 19–32)
Calcium: 9 mg/dL (ref 8.4–10.5)
Chloride: 108 mEq/L (ref 96–112)
Creat: 1.19 mg/dL — ABNORMAL HIGH (ref 0.50–1.10)
Potassium: 4.2 mEq/L (ref 3.5–5.3)

## 2012-11-15 LAB — CBC
MCH: 29.7 pg (ref 26.0–34.0)
MCHC: 32.6 g/dL (ref 30.0–36.0)
Platelets: 199 10*3/uL (ref 150–400)
RDW: 15.8 % — ABNORMAL HIGH (ref 11.5–15.5)

## 2012-11-15 NOTE — Progress Notes (Signed)
  Subjective:    Patient ID: Christine Zavala, female    DOB: 1933/06/14, 79 y.o.   MRN: [id number]  HPI  Patient here to followup interim from last visit. Her CK was noted to be elevated she also had acute renal failure and transaminitis. All her medications with the exception of Plavix was discontinued. Over the past 2 weeks she no longer has any muscle aches or pains and is able to walk is normal. She denies any dizziness chest pain or shortness of breath. She feels much better. Her blood sugar fasting yesterday was 113. She is here today with her daughter. She did take a dose of lisinopril this morning. She takes her Lasix as needed previously did not see any difference with the use a medication.   Review of Systems   GEN- denies fatigue, fever, weight loss,weakness, recent illness HEENT- denies eye drainage, change in vision, nasal discharge, CVS- denies chest pain, palpitations RESP- denies SOB, cough, wheeze ABD- denies N/V, change in stools, abd pain MSK- denies joint pain, muscle aches, injury  Neuro- denies headache, dizziness, syncope, seizure activity      Objective:   Physical Exam  GEN- NAD, alert and orientedx3, elderly female HEENT- PERRL, EOMI, non injected sclera, pink conjunctiva, MMM, oropharynx clear Neck- Supple, no LAD CVS- RRR, no murmur RESP-CTAB EXT- trace pedal edema,  Pulses- Radial 2+ DP equal bilat NEURO-CNII-XII in tact, ambulates slowly , normal tone, moving all 4 ext     Assessment & Plan:

## 2012-11-15 NOTE — Patient Instructions (Addendum)
Restart metformin 500mg  twice a day  Take your blood pressure medication lisinopril  Continue plavix You can take lasix as needed- twice a day- take with your Kdur  We we will call with lab results F/U August 4th for labs , and schedule that week--- with Dr.Pickward follow-up HTN, Diabetes

## 2012-11-16 DIAGNOSIS — N179 Acute kidney failure, unspecified: Secondary | ICD-10-CM | POA: Insufficient documentation

## 2012-11-16 NOTE — Assessment & Plan Note (Signed)
Continue lisinopril

## 2012-11-16 NOTE — Assessment & Plan Note (Signed)
bmet  Rechecked, renal function much improved

## 2012-11-16 NOTE — Assessment & Plan Note (Deleted)
Due to elevated CK from dehydration and statin drug, repeat CK normal and myalgia now resolved

## 2012-11-16 NOTE — Assessment & Plan Note (Signed)
aricept caused side effects, pt wishes to stay off for now

## 2012-11-16 NOTE — Assessment & Plan Note (Signed)
Restart metformin at 500mg  BID, discussed diet, would hold on increasing until she is feeling better, could try 850mg  BID A1C is less than 8

## 2012-11-16 NOTE — Assessment & Plan Note (Addendum)
Would not restart statin drug at this time. Discussed diet She did move from pravastatin to crestor, consider lower dose crestor as this may have been dose dependent with the rhabdo

## 2012-11-17 DIAGNOSIS — M6282 Rhabdomyolysis: Secondary | ICD-10-CM | POA: Insufficient documentation

## 2012-11-17 NOTE — Telephone Encounter (Signed)
Pt was called.

## 2012-11-17 NOTE — Addendum Note (Signed)
Addended by: Milinda Antis F on: 11/17/2012 07:52 AM   Modules accepted: Level of Service

## 2012-11-17 NOTE — Assessment & Plan Note (Signed)
Due to elevated CK from dehydration and statin drug, repeat CK normal and myalgia now resolved 

## 2012-12-20 ENCOUNTER — Ambulatory Visit (INDEPENDENT_AMBULATORY_CARE_PROVIDER_SITE_OTHER): Payer: Medicare Other | Admitting: Neurology

## 2012-12-20 ENCOUNTER — Encounter: Payer: Self-pay | Admitting: Neurology

## 2012-12-20 VITALS — BP 136/78 | HR 81 | Ht 62.0 in | Wt 268.0 lb

## 2012-12-20 DIAGNOSIS — E785 Hyperlipidemia, unspecified: Secondary | ICD-10-CM

## 2012-12-20 DIAGNOSIS — I10 Essential (primary) hypertension: Secondary | ICD-10-CM

## 2012-12-20 DIAGNOSIS — R609 Edema, unspecified: Secondary | ICD-10-CM

## 2012-12-20 NOTE — Progress Notes (Signed)
HPI:  Christine Zavala is a 79 years old right-handed Caucasian female, referred by her primary care physician Dr. Lynnea Ferrier,  accompanied by her daughter at today's clinical visit.  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  She was noticed by her family, and also herself notice that she has mild memory trouble for 7 years, difficulty focusing, difficulty with people's name,  Her memory trouble gradually getting worse, she had 77 years of education, 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  She complains of bilateral hip pain, knee pain, gait difficulty,  She was referred for MRI of the brain, we reviewed the film together, there was left frontal subcortical chronic stroke, no acute lesions, she is taking Plavix, no stroke-like events  UPDATE July 28th 2014;  She took crestor for 2 months, also on metformin 1000mg  bid, she noticed more difficulty walking, memory loss.   She felt like new person after those medications were stopped. Her son lives with her, she quit driving in 1610, she could not past the test.   She has stroke in May 2014, difficulty writing her name, she complains of right knee pain, gait difficulty.  US carotid were normal. ECHO was pending.  Review of Systems  Out of a complete 14 system review, the patient complains of only the following symptoms, and all other reviewed systems are negative.   Constitutional:   Weight loss, fatigue Cardiovascular:  Swelling in legs Ear/Nose/Throat:  N/A Skin: N/A Eyes: blurry vision Respiratory: N/A Gastroitestinal:  Incontinence, constipation   Hematology/Lymphatic:  N/A Endocrine:   N/A Musculoskeletal: joints pain, swelling Allergy/Immunology: N/A Neurological:  Memory loss, confusion, numbness, weakness, dizziness, sleepiness, Psychiatric:    Not enough sleep, change in appetite, disinterested in activities,  PHYSICAL EXAMINATOINS:  Generalized: In no acute distress  Neck: Supple, no carotid bruits   Cardiac: Regular rate rhythm  Pulmonary: Clear to auscultation bilaterally  Musculoskeletal: No deformity  Neurological examination  Mentation: Alert oriented to time, place, history taking, and causual conversation, obese, Mini-Mental Status Exmination is 30/30     Cranial nerve II-XII: Pupils were equal round reactive to light extraocular movements were full, visual field were full on confrontational test. facial sensation and strength were normal. hearing was intact to finger rubbing bilaterally. Uvula tongue midline.  head turning and shoulder shrug and were normal and symmetric.Tongue protrusion into cheek strength was normal.  Motor: normal tone, bulk and strength.  Sensory: Intact to fine touch, pinprick, preserved vibratory sensation, and proprioception at toes.  Coordination: Normal finger to nose, heel-to-shin bilaterally there was no truncal ataxia  Gait: mildly unsteady, atalgic gait.  Romberg signs: Negative  Deep tendon reflexes: Brachioradialis 2/2, biceps 2/2, triceps 2/2, patellar 2/2, Achilles trace, plantar responses were flexor bilaterally.  Assessment and plan:  79 years old right-handed Caucasian female, with past medical history of obesity, hypertension, hyperlipidemia, diabetes, colon cancer, status post chemo and radiation,  family history of dementia, she presented with memory trouble, also gait difficulty, MRI showed left frontal subcortical chronic stroke  1 her gait difficulty are multifactor, including obesity, sedentary lifestyle, deconditioning  2. Home physical therapy 3. RTC in 6 months with Christine Zavala  .

## 2012-12-27 ENCOUNTER — Other Ambulatory Visit: Payer: Self-pay | Admitting: Family Medicine

## 2012-12-27 ENCOUNTER — Other Ambulatory Visit: Payer: Medicare Other

## 2012-12-27 DIAGNOSIS — E119 Type 2 diabetes mellitus without complications: Secondary | ICD-10-CM

## 2012-12-27 DIAGNOSIS — I10 Essential (primary) hypertension: Secondary | ICD-10-CM

## 2012-12-27 DIAGNOSIS — E785 Hyperlipidemia, unspecified: Secondary | ICD-10-CM

## 2012-12-27 DIAGNOSIS — R748 Abnormal levels of other serum enzymes: Secondary | ICD-10-CM

## 2012-12-27 DIAGNOSIS — M6282 Rhabdomyolysis: Secondary | ICD-10-CM

## 2012-12-27 LAB — CBC WITH DIFFERENTIAL/PLATELET
Basophils Absolute: 0 10*3/uL (ref 0.0–0.1)
Basophils Relative: 0 % (ref 0–1)
Eosinophils Relative: 2 % (ref 0–5)
HCT: 35.5 % — ABNORMAL LOW (ref 36.0–46.0)
Hemoglobin: 11.5 g/dL — ABNORMAL LOW (ref 12.0–15.0)
MCH: 30.5 pg (ref 26.0–34.0)
MCHC: 32.4 g/dL (ref 30.0–36.0)
MCV: 94.2 fL (ref 78.0–100.0)
Monocytes Absolute: 0.5 10*3/uL (ref 0.1–1.0)
Monocytes Relative: 8 % (ref 3–12)
RDW: 15.4 % (ref 11.5–15.5)

## 2012-12-27 LAB — COMPREHENSIVE METABOLIC PANEL
ALT: 11 U/L (ref 0–35)
AST: 12 U/L (ref 0–37)
Alkaline Phosphatase: 75 U/L (ref 39–117)
CO2: 25 mEq/L (ref 19–32)
Creat: 1.29 mg/dL — ABNORMAL HIGH (ref 0.50–1.10)
Sodium: 140 mEq/L (ref 135–145)
Total Bilirubin: 0.6 mg/dL (ref 0.3–1.2)

## 2012-12-27 LAB — LIPID PANEL
HDL: 41 mg/dL (ref >39)
Total CHOL/HDL Ratio: 7.3 Ratio
VLDL: 50 mg/dL — ABNORMAL HIGH (ref 0–40)

## 2012-12-27 LAB — HEMOGLOBIN A1C: Mean Plasma Glucose: 137 mg/dL — ABNORMAL HIGH (ref <117)

## 2012-12-27 NOTE — Addendum Note (Signed)
Addended by: Reginia Forts on: 12/27/2012 11:35 AM   Modules accepted: Orders

## 2012-12-27 NOTE — Addendum Note (Signed)
Addended by: Reginia Forts on: 12/27/2012 10:57 AM   Modules accepted: Orders

## 2012-12-31 ENCOUNTER — Telehealth: Payer: Self-pay | Admitting: Family Medicine

## 2012-12-31 MED ORDER — LISINOPRIL 40 MG PO TABS: 40.0000 mg | ORAL_TABLET | Freq: Every day | ORAL | Status: DC

## 2012-12-31 MED ORDER — POTASSIUM CHLORIDE CRYS ER 20 MEQ PO TBCR: 20.0000 meq | EXTENDED_RELEASE_TABLET | Freq: Every day | ORAL | Status: DC

## 2012-12-31 NOTE — Telephone Encounter (Signed)
Rx Refilled  

## 2013-01-03 ENCOUNTER — Telehealth: Payer: Self-pay | Admitting: Family Medicine

## 2013-01-03 NOTE — Telephone Encounter (Signed)
Lisinopril 40 mg tab 1 QD #90

## 2013-01-04 ENCOUNTER — Encounter: Payer: Self-pay | Admitting: Family Medicine

## 2013-01-04 ENCOUNTER — Telehealth: Payer: Self-pay | Admitting: Family Medicine

## 2013-01-04 ENCOUNTER — Ambulatory Visit (INDEPENDENT_AMBULATORY_CARE_PROVIDER_SITE_OTHER): Payer: Medicare Other | Admitting: Family Medicine

## 2013-01-04 ENCOUNTER — Ambulatory Visit: Payer: Medicare Other | Admitting: Family Medicine

## 2013-01-04 VITALS — BP 120/74 | HR 82 | Temp 98.0°F | Resp 18 | Wt 273.0 lb

## 2013-01-04 DIAGNOSIS — I6350 Cerebral infarction due to unspecified occlusion or stenosis of unspecified cerebral artery: Secondary | ICD-10-CM

## 2013-01-04 DIAGNOSIS — E785 Hyperlipidemia, unspecified: Secondary | ICD-10-CM

## 2013-01-04 DIAGNOSIS — I639 Cerebral infarction, unspecified: Secondary | ICD-10-CM

## 2013-01-04 DIAGNOSIS — E119 Type 2 diabetes mellitus without complications: Secondary | ICD-10-CM

## 2013-01-04 DIAGNOSIS — IMO0001 Reserved for inherently not codable concepts without codable children: Secondary | ICD-10-CM

## 2013-01-04 LAB — COMPLETE METABOLIC PANEL WITH GFR
AST: 12 U/L (ref 0–37)
Albumin: 3.7 g/dL (ref 3.5–5.2)
Alkaline Phosphatase: 67 U/L (ref 39–117)
BUN: 22 mg/dL (ref 6–23)
GFR, Est Non African American: 39 mL/min — ABNORMAL LOW
Glucose, Bld: 157 mg/dL — ABNORMAL HIGH (ref 70–99)
Potassium: 4.3 mEq/L (ref 3.5–5.3)
Sodium: 140 mEq/L (ref 135–145)
Total Bilirubin: 0.7 mg/dL (ref 0.3–1.2)
Total Protein: 6.2 g/dL (ref 6.0–8.3)

## 2013-01-04 LAB — CK: Total CK: 38 U/L (ref 7–177)

## 2013-01-04 MED ORDER — POTASSIUM CHLORIDE CRYS ER 20 MEQ PO TBCR: 20.0000 meq | EXTENDED_RELEASE_TABLET | Freq: Every day | ORAL | Status: DC

## 2013-01-04 MED ORDER — LISINOPRIL 40 MG PO TABS: 40.0000 mg | ORAL_TABLET | Freq: Every day | ORAL | Status: DC

## 2013-01-04 MED ORDER — PRAVASTATIN SODIUM 40 MG PO TABS: 40.0000 mg | ORAL_TABLET | Freq: Every day | ORAL | Status: DC

## 2013-01-04 NOTE — Telephone Encounter (Signed)
Refilled for #90 

## 2013-01-04 NOTE — Patient Instructions (Addendum)
1) RESUME PLAVIX ASAP- this is a blood thinner to prevent strokes.  This should not be stopped.  2)  You should be on metformin 500 mg TWICE a day for diabetes.  Diabetes test  is much better at 6.4.  3) your liver, and muscle aches were due to the crestor alone.  I would never use that again.  I would replace it with pravastatin 40 mg a day because your LDL is 209 (goal is less than 70).  This is to prevent future strokes.  I would recheck your liver and kidney function in 1 month.  I will schedule this.

## 2013-01-04 NOTE — Telephone Encounter (Signed)
Rx Refilled  

## 2013-01-04 NOTE — Telephone Encounter (Signed)
Klor-Con M20 tab 20 meq 1 QD #90  This was sent to the pharmacy the other day, but she needs a 90 day supply, not a 30 day supply.

## 2013-01-04 NOTE — Progress Notes (Signed)
Subjective:    Patient ID: Christine Zavala, female    DOB: [birth year], 79 y.o.   MRN: [id number]  HPI Patient was seen initially in April for memory loss. As part of workup of memory loss an MRI of the brain was obtained which revealed a remote left hemispheric cerebral infarct. At that time I started the patient on Plavix 75 mg a day for secondary stroke prevention. Her hemoglobin A1c was 7.4. Therefore I also put her on metformin 1000 mg by mouth twice a day. She also had an LDL cholesterol of 190 on pravastatin. Therefore I switched her to Crestor. Shortly thereafter she developed myalgias and weakness. She was found to have an elevated CK greater than 1000. She also had mild renal insufficiency with a creatinine of 1.6 and slight elevation in her liver function tests. She was told to temporarily discontinue the Crestor. She was instructed to follow up again with me. The patient and her daughter it upset. Her daughter is concerned that I  "loaded her with medication".  Furthermore they have the impression that the Crestor was causing liver failure and kidney failure.  They would like to switch PCP's.    Since last seeing me, the patient has inexplicably stop the Plavix by accident. She decrease the metformin to once a day, and she is stop Crestor. Her most recent lab work is listed below. Significant for hemoglobin A1c of 6.4 and an LDL cholesterol of 209. Lab on 12/27/2012  Component Date Value Range Status  . Sodium 12/27/2012 140  135 - 145 mEq/L Final  . Potassium 12/27/2012 4.9  3.5 - 5.3 mEq/L Final  . Chloride 12/27/2012 106  96 - 112 mEq/L Final  . CO2 12/27/2012 25  19 - 32 mEq/L Final  . Glucose, Bld 12/27/2012 136* 70 - 99 mg/dL Final  . BUN 62/13/0865 26* 6 - 23 mg/dL Final  . Creat 78/46/9629 1.29* 0.50 - 1.10 mg/dL Final  . Total Bilirubin 12/27/2012 0.6  0.3 - 1.2 mg/dL Final  . Alkaline Phosphatase 12/27/2012 75  39 - 117 U/L Final  . AST 12/27/2012 12  0 - 37 U/L Final  . ALT  12/27/2012 11  0 - 35 U/L Final  . Total Protein 12/27/2012 6.5  6.0 - 8.3 g/dL Final  . Albumin 52/84/1324 4.0  3.5 - 5.2 g/dL Final  . Calcium 40/02/2724 9.4  8.4 - 10.5 mg/dL Final  . WBC 36/64/4034 6.8  4.0 - 10.5 K/uL Final  . RBC 12/27/2012 3.77* 3.87 - 5.11 MIL/uL Final  . Hemoglobin 12/27/2012 11.5* 12.0 - 15.0 g/dL Final  . HCT 74/25/9563 35.5* 36.0 - 46.0 % Final  . MCV 12/27/2012 94.2  78.0 - 100.0 fL Final  . MCH 12/27/2012 30.5  26.0 - 34.0 pg Final  . MCHC 12/27/2012 32.4  30.0 - 36.0 g/dL Final  . RDW 87/56/4332 15.4  11.5 - 15.5 % Final  . Platelets 12/27/2012 238  150 - 400 K/uL Final  . Neutrophils Relative % 12/27/2012 74  43 - 77 % Final  . Neutro Abs 12/27/2012 5.0  1.7 - 7.7 K/uL Final  . Lymphocytes Relative 12/27/2012 16  12 - 46 % Final  . Lymphs Abs 12/27/2012 1.1  0.7 - 4.0 K/uL Final  . Monocytes Relative 12/27/2012 8  3 - 12 % Final  . Monocytes Absolute 12/27/2012 0.5  0.1 - 1.0 K/uL Final  . Eosinophils Relative 12/27/2012 2  0 - 5 % Final  . Eosinophils Absolute 12/27/2012  0.1  0.0 - 0.7 K/uL Final  . Basophils Relative 12/27/2012 0  0 - 1 % Final  . Basophils Absolute 12/27/2012 0.0  0.0 - 0.1 K/uL Final  . Smear Review 12/27/2012 Criteria for review not met   Final  . Hemoglobin A1C 12/27/2012 6.4* <5.7 % Final   Comment:                                                                                                 According to the ADA Clinical Practice Recommendations for 2011, when                          HbA1c is used as a screening test:                                                       >=6.5%   Diagnostic of Diabetes Mellitus                                     (if abnormal result is confirmed)                                                     5.7-6.4%   Increased risk of developing Diabetes Mellitus                                                     References:Diagnosis and Classification of Diabetes Mellitus,Diabetes                           Care,2011,34(Suppl 1):S62-S69 and Standards of Medical Care in                                  Diabetes - 2011,Diabetes Care,2011,34 (Suppl 1):S11-S61.                             . Mean Plasma Glucose 12/27/2012 137* <117 mg/dL Final  . Cholesterol 16/02/9603 299* 0 - 200 mg/dL Final   Comment: ATP III Classification:                                < 200        mg/dL        Desirable  200 - 239     mg/dL        Borderline High                               >= 240        mg/dL        High                             . Triglycerides 12/27/2012 249* <150 mg/dL Final  . HDL 98/03/9146 41  >39 mg/dL Final  . Total CHOL/HDL Ratio 12/27/2012 7.3   Final  . VLDL 12/27/2012 50* 0 - 40 mg/dL Final  . LDL Cholesterol 12/27/2012 208* 0 - 99 mg/dL Final   Comment:                            Total Cholesterol/HDL Ratio:CHD Risk                                                 Coronary Heart Disease Risk Table                                                                 Men       Women                                   1/2 Average Risk              3.4        3.3                                       Average Risk              5.0        4.4                                    2X Average Risk              9.6        7.1                                    3X Average Risk             23.4       11.0                          Use the calculated Patient Ratio above and the CHD Risk table  to determine the patient's CHD Risk.                          ATP III Classification (LDL):                                < 100        mg/dL         Optimal                               100 - 129     mg/dL         Near or Above Optimal                               130 - 159     mg/dL         Borderline High                               160 - 189     mg/dL         High                                > 190        mg/dL         Very High                              .[facility] Current Outpatient Prescriptions on File Prior to Visit  Medication Sig Dispense Refill  . Blood Glucose Monitoring Suppl (BLOOD GLUCOSE MONITOR KIT) KIT Pt needs glucose monitor, strips (#100) and lancets (1 box). With prn refills.Marland KitchenMarland KitchenShe checks BS bid and DX: 250.00  1 each  0  . Horse Chestnut 300 MG CAPS Take by mouth.      [name] lisinopril (PRINIVIL,ZESTRIL) 40 MG tablet Take 1 tablet (40 mg total) by mouth daily.  90 tablet  1  . metFORMIN (GLUCOPHAGE) 500 MG tablet Take 500 mg by mouth 2 (two) times daily with a meal.      . vitamin B-12 (CYANOCOBALAMIN) 1000 MCG tablet Take 1,000 mcg by mouth daily.       No current facility-administered medications on file prior to visit.   Allergies  Allergen Reactions  . Aspirin Nausea And Vomiting  . Codeine Sulfate Nausea And Vomiting   History   Social History  . Marital Status: Widowed    Spouse Name: N/A    Number of Children: 2  . Years of Education: 12   Occupational History  . retired    Social History Main Topics  . Smoking status: Former Games developer  . Smokeless tobacco: Never Used     Comment: quit 1970's  . Alcohol Use: No  . Drug Use: No  . Sexually Active: Not on file   Other Topics Concern  . Not on file   Social History Narrative   Patient lives at home with her Son Cindra Presume). Patient is a widowed. Patient is retired. Right handed.   Caffeine - one cup daily   Family History  Problem Relation Age of Onset  .  Cancer Mother   . Dementia Father   . COPD Mother        Review of Systems  All other systems reviewed and are negative.       Objective:   Physical Exam  Vitals reviewed. Constitutional: She is oriented to person, place, and time. She appears well-developed and well-nourished.  Cardiovascular: Normal rate, regular rhythm, normal heart sounds and intact distal pulses.   No murmur heard. Pulmonary/Chest: Effort normal and breath sounds normal. No respiratory distress. She has no  wheezes. She has no rales. She exhibits no tenderness.  Abdominal: Soft. Bowel sounds are normal. She exhibits no distension. There is no tenderness. There is no rebound and no guarding.  Neurological: She is alert and oriented to person, place, and time. She has normal reflexes.  Psychiatric: She has a normal mood and affect. Her behavior is normal.          Assessment & Plan:  1. HLD (hyperlipidemia) See below. - COMPLETE METABOLIC PANEL WITH GFR  2. Myalgia and myositis This is most definitely related to the Crestor. I apologized to the patient because I do feel that Crestor made her feel bad and caused slight increase in her liver function tests. However I tried to calm her fears and explained why it is necessary to treat her elevated cholesterol to prevent future strokes. I will recheck liver function tests today. Start pravastatin 40 mg by mouth daily. I will have the patient recheck in one month Dr. Jeanice Lim to monitor her creatinine and liver function tests closely - CK  3. CVA (cerebral infarction) Patient is unaccompanied today and is very confused about her medications.  I asked her to resume the Plavix 75 mg by mouth daily. I feel it had no role in the way she felt and is extremely necessary to prevent future strokes.  4. Type II or unspecified type diabetes mellitus without mention of complication, not stated as uncontrolled I asked her to resume the metformin 500 mg by mouth twice a day. Her diabetes is now well controlled. We will need to watch renal function closely given her marginal creatinine but I feel this is the safest medication for her diabetes to prevent hypoglycemia given her frequent confusion and questionable compliance with meds.   I understand the patient's fears as well as her family member's.  However they now question my medical decision-making.  I feel that this has negatively impacted the doctor-patient relationship because I really feel her significant  hyperlipidemia these to be treated to prevent future strokes.  Therefore I will allow her to switch primary care providers as I feel the patient needs to trust her doctor's decisions.  I will defer to her future PCP, the decision regarding resumption of Aricept for dementia.

## 2013-02-07 ENCOUNTER — Ambulatory Visit: Payer: Medicare Other | Admitting: Family Medicine

## 2013-05-27 ENCOUNTER — Other Ambulatory Visit: Payer: Self-pay | Admitting: Family Medicine

## 2013-06-22 ENCOUNTER — Ambulatory Visit: Payer: Medicare Other | Admitting: Nurse Practitioner

## 2013-07-26 ENCOUNTER — Encounter: Payer: Self-pay | Admitting: *Deleted

## 2013-07-26 ENCOUNTER — Other Ambulatory Visit: Payer: Self-pay | Admitting: Family Medicine

## 2013-07-26 NOTE — Telephone Encounter (Signed)
Medication filled x1 with no refills.   Requires office visit before any further refills can be given.   Letter sent.  

## 2013-08-18 ENCOUNTER — Other Ambulatory Visit: Payer: Medicare Other

## 2013-08-18 ENCOUNTER — Other Ambulatory Visit: Payer: Self-pay | Admitting: Family Medicine

## 2013-08-18 ENCOUNTER — Encounter: Payer: Self-pay | Admitting: Family Medicine

## 2013-08-18 DIAGNOSIS — Z79899 Other long term (current) drug therapy: Secondary | ICD-10-CM

## 2013-08-18 DIAGNOSIS — E119 Type 2 diabetes mellitus without complications: Secondary | ICD-10-CM

## 2013-08-18 DIAGNOSIS — E785 Hyperlipidemia, unspecified: Secondary | ICD-10-CM

## 2013-08-18 DIAGNOSIS — Z0000 Encounter for general adult medical examination without abnormal findings: Secondary | ICD-10-CM

## 2013-08-18 DIAGNOSIS — M810 Age-related osteoporosis without current pathological fracture: Secondary | ICD-10-CM | POA: Insufficient documentation

## 2013-08-18 DIAGNOSIS — Z8639 Personal history of other endocrine, nutritional and metabolic disease: Secondary | ICD-10-CM | POA: Insufficient documentation

## 2013-08-18 DIAGNOSIS — I10 Essential (primary) hypertension: Secondary | ICD-10-CM

## 2013-08-18 LAB — CBC WITH DIFFERENTIAL/PLATELET
BASOS PCT: 0 % (ref 0–1)
Basophils Absolute: 0 10*3/uL (ref 0.0–0.1)
Eosinophils Absolute: 0.2 10*3/uL (ref 0.0–0.7)
Eosinophils Relative: 3 % (ref 0–5)
HEMATOCRIT: 37.2 % (ref 36.0–46.0)
HEMOGLOBIN: 12.1 g/dL (ref 12.0–15.0)
LYMPHS PCT: 17 % (ref 12–46)
Lymphs Abs: 1.2 10*3/uL (ref 0.7–4.0)
MCH: 29.7 pg (ref 26.0–34.0)
MCHC: 32.5 g/dL (ref 30.0–36.0)
MCV: 91.4 fL (ref 78.0–100.0)
MONOS PCT: 8 % (ref 3–12)
Monocytes Absolute: 0.6 10*3/uL (ref 0.1–1.0)
NEUTROS ABS: 5.1 10*3/uL (ref 1.7–7.7)
NEUTROS PCT: 72 % (ref 43–77)
Platelets: 213 10*3/uL (ref 150–400)
RBC: 4.07 MIL/uL (ref 3.87–5.11)
RDW: 14.4 % (ref 11.5–15.5)
WBC: 7.1 10*3/uL (ref 4.0–10.5)

## 2013-08-18 LAB — TSH: TSH: 4.562 u[IU]/mL — ABNORMAL HIGH (ref 0.350–4.500)

## 2013-08-18 LAB — COMPLETE METABOLIC PANEL WITH GFR
ALBUMIN: 3.6 g/dL (ref 3.5–5.2)
ALT: 12 U/L (ref 0–35)
AST: 17 U/L (ref 0–37)
Alkaline Phosphatase: 64 U/L (ref 39–117)
BUN: 19 mg/dL (ref 6–23)
CALCIUM: 8.8 mg/dL (ref 8.4–10.5)
CHLORIDE: 104 meq/L (ref 96–112)
CO2: 27 meq/L (ref 19–32)
Creat: 1.17 mg/dL — ABNORMAL HIGH (ref 0.50–1.10)
GFR, EST AFRICAN AMERICAN: 52 mL/min — AB
GFR, Est Non African American: 45 mL/min — ABNORMAL LOW
GLUCOSE: 116 mg/dL — AB (ref 70–99)
Potassium: 4.4 mEq/L (ref 3.5–5.3)
SODIUM: 140 meq/L (ref 135–145)
TOTAL PROTEIN: 6.2 g/dL (ref 6.0–8.3)
Total Bilirubin: 0.8 mg/dL (ref 0.2–1.2)

## 2013-08-18 LAB — LIPID PANEL
CHOLESTEROL: 220 mg/dL — AB (ref 0–200)
HDL: 45 mg/dL (ref >39)
LDL Cholesterol: 139 mg/dL — ABNORMAL HIGH (ref 0–99)
Total CHOL/HDL Ratio: 4.9 Ratio
Triglycerides: 182 mg/dL — ABNORMAL HIGH (ref <150)
VLDL: 36 mg/dL (ref 0–40)

## 2013-08-18 LAB — HEMOGLOBIN A1C
HEMOGLOBIN A1C: 6.5 % — AB (ref <5.7)
MEAN PLASMA GLUCOSE: 140 mg/dL — AB (ref <117)

## 2013-08-19 LAB — T3, FREE: T3, Free: 2.8 pg/mL (ref 2.3–4.2)

## 2013-08-19 LAB — T4, FREE: Free T4: 1.34 ng/dL (ref 0.80–1.80)

## 2013-08-24 ENCOUNTER — Ambulatory Visit (INDEPENDENT_AMBULATORY_CARE_PROVIDER_SITE_OTHER): Payer: Medicare Other | Admitting: Family Medicine

## 2013-08-24 ENCOUNTER — Encounter: Payer: Self-pay | Admitting: Family Medicine

## 2013-08-24 VITALS — BP 128/62 | HR 76 | Temp 98.3°F | Resp 18 | Ht 63.0 in | Wt 276.0 lb

## 2013-08-24 DIAGNOSIS — M810 Age-related osteoporosis without current pathological fracture: Secondary | ICD-10-CM

## 2013-08-24 DIAGNOSIS — N3281 Overactive bladder: Secondary | ICD-10-CM

## 2013-08-24 DIAGNOSIS — E7800 Pure hypercholesterolemia, unspecified: Secondary | ICD-10-CM

## 2013-08-24 DIAGNOSIS — Z0000 Encounter for general adult medical examination without abnormal findings: Secondary | ICD-10-CM

## 2013-08-24 DIAGNOSIS — C801 Malignant (primary) neoplasm, unspecified: Secondary | ICD-10-CM

## 2013-08-24 DIAGNOSIS — E119 Type 2 diabetes mellitus without complications: Secondary | ICD-10-CM

## 2013-08-24 DIAGNOSIS — Z85038 Personal history of other malignant neoplasm of large intestine: Secondary | ICD-10-CM

## 2013-08-24 DIAGNOSIS — I10 Essential (primary) hypertension: Secondary | ICD-10-CM

## 2013-08-24 DIAGNOSIS — N318 Other neuromuscular dysfunction of bladder: Secondary | ICD-10-CM

## 2013-08-24 NOTE — Progress Notes (Signed)
Patient ID: Christine Zavala, female   DOB: 30-Nov-1933, 80 y.o.   MRN: [id number] Subjective:   Patient presents for Medicare Annual/Subsequent preventive examination.    Patient here for annual physical exam. Her fasting labs were reviewed with her and her daughter at bedside her medications were reviewed. She's been doing well overall. She has a good appetite and she has been mobile around the home with the use of assistance. She has no specific concerns today.  Diabetes mellitus her blood sugars have been less than 130 fasting she's been taking metformin 500 mg twice a day and still week ago which she noticed on the bottle it said 2 tablets twice a day.  She's been tolerating her cholesterol medication without any leg pains weakness like she had previously with the higher doses   She will like to have mammogram and bone density scheduled. Of note she does have a history of colorectal cancer she was being followed by oncology as well as gastroenterology she is overdue for her colonoscopy this was supposed to be done in 2013.  Review Past Medical/Family/Social: - PER EMR   Risk Factors  Current exercise habits: None Dietary issues discussed: Discussed with patient and daughter low carb low fat diet  Cardiac risk factors: Obesity (BMI >= 30 kg/m2).   Depression Screen  (Note: if answer to either of the following is "Yes", a more complete depression screening is indicated)  Over the past two weeks, have you felt down, depressed or hopeless? Few days Over the past two weeks, have you felt little interest or pleasure in doing things? Few days Have you lost interest or pleasure in daily life? No Do you often feel hopeless? No Do you cry easily over simple problems? No   Activities of Daily Living  In your present state of health, do you have any difficulty performing the following activities?:  Driving? YES  Managing money? No  Feeding yourself? No  Getting from bed to chair? No  Climbing a  flight of stairs? YES Preparing food and eating?: No  Bathing or showering? No  Getting dressed: No  Getting to the toilet? No  Using the toilet:No  Moving around from place to place: Sometimes In the past year have you fallen or had a near fall?:YES Are you sexually active? No  Do you have more than one partner? No   Hearing Difficulties: No  Do you often ask people to speak up or repeat themselves? No  Do you experience ringing or noises in your ears? No Do you have difficulty understanding soft or whispered voices? No  Do you feel that you have a problem with memory? No Do you often misplace items? No  Do you feel safe at home? Yes  Cognitive Testing  Alert? Yes Normal Appearance?Yes  Oriented to person? Yes Place? Yes  Time? Yes  Recall of three objects? Yes  Can perform simple calculations? Yes  Displays appropriate judgment?Yes  Can read the correct time from a watch face?Yes   List the Names of Other Physician/Practitioners you currently use:   None  Screening Tests / Date Colonoscopy   - overdue                  Zostavax -declines Mammogram - scheduled Influenza Vaccine -UTD Tetanus/tdap- not covered   ROS: GEN- denies fatigue, fever, weight loss,weakness, recent illness HEENT- denies eye drainage, change in vision, nasal discharge, CVS- denies chest pain, palpitations RESP- denies SOB, cough, wheeze ABD- denies N/V, change  in stools, abd pain GU- denies dysuria, hematuria, dribbling, incontinence MSK- denies joint pain, muscle aches, injury Neuro- denies headache, dizziness, syncope, seizure activity  PHYSICAL:  GEN- NAD, alert and orientedx3, elderly female,obese, examined in chair HEENT- PERRL, EOMI, non injected sclera, pink conjunctiva, MMM, oropharynx clear Neck- Supple, no LAD, no bruit CVS- RRR, no murmur RESP-CTAB ABD-NABS,soft,NT,ND EXT- trace pedal edema,  Pulses- Radial 2+ DP equal bilat NEURO-slow unsteady gait, uses  assitance   Assessment:    Annual wellness medicare exam   Plan:    During the course of the visit the patient was educated and counseled about appropriate screening and preventive services including:  Screening mammography  Colonoscopy to be done Shingles vaccine. Declines Screen Neg for depression.  Diet review for nutrition referral? Yes ____ Not Indicated __x__  Patient Instructions (the written plan) was given to the patient.  Medicare Attestation  I have personally reviewed:  The patient's medical and social history  Their use of alcohol, tobacco or illicit drugs  Their current medications and supplements  The patient's functional ability including ADLs,fall risks, home safety risks, cognitive, and hearing and visual impairment  Diet and physical activities  Evidence for depression or mood disorders  The patient's weight, height, BMI, and visual acuity have been recorded in the chart. I have made referrals, counseling, and provided education to the patient based on review of the above and I have provided the patient with a written personalized care plan for preventive services.

## 2013-08-24 NOTE — Assessment & Plan Note (Signed)
D. to her history with high dose statin drugs were she had some mild rhabdo we will continue her pravastatin 40 mg a she does not have any symptoms with this and her liver function has been normal. We will be unable to get her LDL below 100 with this but is still vastly improved from previous

## 2013-08-24 NOTE — Assessment & Plan Note (Signed)
Just overactive bladder with some stress incontinence. I've given her a sample Vesicare 5 mg to try once a day discussed the side effects of this medication she wants to try something for her urinary problems

## 2013-08-24 NOTE — Assessment & Plan Note (Signed)
Blood pressure is well controlled 

## 2013-08-24 NOTE — Assessment & Plan Note (Signed)
Bone density pending

## 2013-08-24 NOTE — Patient Instructions (Addendum)
Prevnar 13 Referral to Dr. Collene Mares for colonoscopy Mammogram and Bone Density to be set up  Decrease metformin to 500mg  twice a day Your labs look good Try new bladder pill once a day, if you do not have any problems , we will send the prescription F/U 3 months

## 2013-08-24 NOTE — Assessment & Plan Note (Signed)
A1c looks very good she's been on 500 mg twice a day she will continue this. She is on ACE inhibitor statin drug

## 2013-08-24 NOTE — Assessment & Plan Note (Signed)
History of colorectal cancer she is overdue for a colonoscopy I will refer her to Dr. Collene Mares who saw her before

## 2013-09-14 ENCOUNTER — Other Ambulatory Visit: Payer: Self-pay | Admitting: Gastroenterology

## 2013-09-14 DIAGNOSIS — K5900 Constipation, unspecified: Secondary | ICD-10-CM

## 2013-09-14 DIAGNOSIS — Z85038 Personal history of other malignant neoplasm of large intestine: Secondary | ICD-10-CM

## 2013-09-28 ENCOUNTER — Other Ambulatory Visit: Payer: Medicare Other

## 2013-10-05 ENCOUNTER — Other Ambulatory Visit: Payer: Medicare Other

## 2013-10-05 ENCOUNTER — Ambulatory Visit
Admission: RE | Admit: 2013-10-05 | Discharge: 2013-10-05 | Disposition: A | Discharge status: Home / Self Care | Payer: Medicare Other | Source: Ambulatory Visit | Attending: Gastroenterology | Admitting: Gastroenterology

## 2013-10-05 DIAGNOSIS — Z85038 Personal history of other malignant neoplasm of large intestine: Secondary | ICD-10-CM

## 2013-10-05 DIAGNOSIS — K5900 Constipation, unspecified: Secondary | ICD-10-CM

## 2013-10-14 ENCOUNTER — Other Ambulatory Visit: Payer: Self-pay | Admitting: Family Medicine

## 2013-10-18 NOTE — Telephone Encounter (Signed)
Medication refilled per protocol. 

## 2013-11-06 ENCOUNTER — Other Ambulatory Visit: Payer: Self-pay | Admitting: Family Medicine

## 2013-11-29 ENCOUNTER — Ambulatory Visit (INDEPENDENT_AMBULATORY_CARE_PROVIDER_SITE_OTHER): Payer: Medicare Other | Admitting: Family Medicine

## 2013-11-29 ENCOUNTER — Encounter: Payer: Self-pay | Admitting: Family Medicine

## 2013-11-29 VITALS — BP 132/80 | HR 72 | Temp 98.1°F | Resp 16 | Ht 62.0 in | Wt 282.0 lb

## 2013-11-29 DIAGNOSIS — N281 Cyst of kidney, acquired: Secondary | ICD-10-CM

## 2013-11-29 DIAGNOSIS — I10 Essential (primary) hypertension: Secondary | ICD-10-CM

## 2013-11-29 DIAGNOSIS — M51369 Other intervertebral disc degeneration, lumbar region without mention of lumbar back pain or lower extremity pain: Secondary | ICD-10-CM

## 2013-11-29 DIAGNOSIS — R609 Edema, unspecified: Secondary | ICD-10-CM

## 2013-11-29 DIAGNOSIS — M5136 Other intervertebral disc degeneration, lumbar region: Secondary | ICD-10-CM | POA: Insufficient documentation

## 2013-11-29 DIAGNOSIS — Q619 Cystic kidney disease, unspecified: Secondary | ICD-10-CM

## 2013-11-29 DIAGNOSIS — M5137 Other intervertebral disc degeneration, lumbosacral region: Secondary | ICD-10-CM

## 2013-11-29 DIAGNOSIS — E7800 Pure hypercholesterolemia, unspecified: Secondary | ICD-10-CM

## 2013-11-29 DIAGNOSIS — E119 Type 2 diabetes mellitus without complications: Secondary | ICD-10-CM

## 2013-11-29 DIAGNOSIS — R600 Localized edema: Secondary | ICD-10-CM

## 2013-11-29 LAB — CBC WITH DIFFERENTIAL/PLATELET
BASOS ABS: 0 10*3/uL (ref 0.0–0.1)
BASOS PCT: 0 % (ref 0–1)
EOS ABS: 0.3 10*3/uL (ref 0.0–0.7)
EOS PCT: 4 % (ref 0–5)
HCT: 36.2 % (ref 36.0–46.0)
Hemoglobin: 12.1 g/dL (ref 12.0–15.0)
Lymphocytes Relative: 17 % (ref 12–46)
Lymphs Abs: 1.2 10*3/uL (ref 0.7–4.0)
MCH: 30.3 pg (ref 26.0–34.0)
MCHC: 33.4 g/dL (ref 30.0–36.0)
MCV: 90.5 fL (ref 78.0–100.0)
Monocytes Absolute: 0.5 10*3/uL (ref 0.1–1.0)
Monocytes Relative: 7 % (ref 3–12)
NEUTROS PCT: 72 % (ref 43–77)
Neutro Abs: 5 10*3/uL (ref 1.7–7.7)
PLATELETS: 233 10*3/uL (ref 150–400)
RBC: 4 MIL/uL (ref 3.87–5.11)
RDW: 14.7 % (ref 11.5–15.5)
WBC: 6.9 10*3/uL (ref 4.0–10.5)

## 2013-11-29 LAB — COMPREHENSIVE METABOLIC PANEL
ALT: 14 U/L (ref 0–35)
AST: 15 U/L (ref 0–37)
Albumin: 3.9 g/dL (ref 3.5–5.2)
Alkaline Phosphatase: 66 U/L (ref 39–117)
BUN: 18 mg/dL (ref 6–23)
CO2: 21 meq/L (ref 19–32)
CREATININE: 1.09 mg/dL (ref 0.50–1.10)
Calcium: 9.1 mg/dL (ref 8.4–10.5)
Chloride: 104 mEq/L (ref 96–112)
Glucose, Bld: 160 mg/dL — ABNORMAL HIGH (ref 70–99)
Potassium: 4.3 mEq/L (ref 3.5–5.3)
Sodium: 139 mEq/L (ref 135–145)
Total Bilirubin: 0.6 mg/dL (ref 0.2–1.2)
Total Protein: 6.3 g/dL (ref 6.0–8.3)

## 2013-11-29 LAB — LIPID PANEL
CHOL/HDL RATIO: 4.4 ratio
CHOLESTEROL: 220 mg/dL — AB (ref 0–200)
HDL: 50 mg/dL (ref >39)
LDL Cholesterol: 139 mg/dL — ABNORMAL HIGH (ref 0–99)
Triglycerides: 157 mg/dL — ABNORMAL HIGH (ref <150)
VLDL: 31 mg/dL (ref 0–40)

## 2013-11-29 LAB — HEMOGLOBIN A1C
HEMOGLOBIN A1C: 7 % — AB (ref <5.7)
Mean Plasma Glucose: 154 mg/dL — ABNORMAL HIGH (ref <117)

## 2013-11-29 MED ORDER — TRAMADOL HCL 50 MG PO TABS: 50.0000 mg | ORAL_TABLET | Freq: Two times a day (BID) | ORAL | Status: DC | PRN

## 2013-11-29 MED ORDER — TORSEMIDE 20 MG PO TABS: 20.0000 mg | ORAL_TABLET | Freq: Every day | ORAL | Status: DC

## 2013-11-29 NOTE — Progress Notes (Signed)
Patient ID: Christine Zavala, female   DOB: 1934/03/27, 80 y.o.   MRN: [id number]   Subjective:    Patient ID: Christine Zavala, female    DOB: 11-Feb-1934, 80 y.o.   MRN: [id number]  Patient presents for 3 month F/U and Back brace  patient here to followup chronic medical problems. She is history of diabetes mellitus hypertension, or lipidemia. She also has chronic pain from arthritis in peripheral edema. She does not check her blood sugars but does take her medications as prescribed. Planes the day of back pain which is chronic. She wants to know for back brace would help her. She does use a heating pad as she takes one Aleve a day which helps. He has been no change in bowel or bladder she's not had any recent falls. Overall she feels very well right now    Review Of Systems:  GEN- denies fatigue, fever, weight loss,weakness, recent illness HEENT- denies eye drainage, change in vision, nasal discharge, CVS- denies chest pain, palpitations RESP- denies SOB, cough, wheeze ABD- denies N/V, change in stools, abd pain GU- denies dysuria, hematuria, dribbling, incontinence MSK-+joint pain, muscle aches, injury Neuro- denies headache, dizziness, syncope, seizure activity       Objective:    BP 132/80  Pulse 72  Temp(Src) 98.1 F (36.7 C) (Oral)  Resp 16  Ht 5\' 2"  (1.575 m)  Wt 282 lb (127.914 kg)  BMI 51.57 kg/m2 GEN- NAD, alert and orientedx3, elderly female,obese, examined in chair HEENT- PERRL, EOMI, non injected sclera, pink conjunctiva, MMM, oropharynx clear CVS- RRR, no murmur RESP-CTAB MSK- Spine NT, decreased ROM Back, Hips, Knees EXT-1+ pedal edema Pulses- Radial 2+ DP diminished equal bilat NEURO-slow unsteady gait, uses assitance      Assessment & Plan:      Problem List Items Addressed This Visit   Renal cyst   Hypertension   Relevant Medications      torsemide (DEMADEX) tablet   High cholesterol   Relevant Medications      torsemide (DEMADEX) tablet   Other Relevant  Orders      Lipid panel   Diabetes mellitus without complication - Primary   Relevant Orders      Comprehensive metabolic panel      CBC with Differential      Hemoglobin A1c   DDD (degenerative disc disease), lumbar   Relevant Medications      traMADol (ULTRAM) tablet 50 mg      Note: This dictation was prepared with Dragon dictation along with smaller phrase technology. Any transcriptional errors that result from this process are unintentional.

## 2013-11-29 NOTE — Patient Instructions (Signed)
New arthritis pill- Ultram New fluid pill- Demadex  Continue all other medications We will call with lab results F/U 3 months

## 2013-11-30 NOTE — Assessment & Plan Note (Signed)
Benign simple cyst seen on her CT scan when she had her virtual colonoscopy I think we can just monitor this. I think it was an incidental finding

## 2013-11-30 NOTE — Assessment & Plan Note (Signed)
She's been off of her water pill she was on Lasix in the past but states it did not help as much. I will try her edema demadex low-dose she will just use it as needed

## 2013-11-30 NOTE — Assessment & Plan Note (Signed)
I will check her A1c today. We discussed the importance of taking her blood sugar but she does not want to do this on a regular basis Goal is A1c less than 8%

## 2013-11-30 NOTE — Assessment & Plan Note (Addendum)
I started her on tramadol for back pain I will like her discontinue the Aleve as we've had difficulties with her renal function  I do not think that she will benefit from a back brace

## 2013-11-30 NOTE — Assessment & Plan Note (Signed)
Blood pressure looks okay today 

## 2013-12-01 ENCOUNTER — Encounter: Payer: Self-pay | Admitting: *Deleted

## 2013-12-12 ENCOUNTER — Telehealth: Payer: Self-pay | Admitting: *Deleted

## 2013-12-12 NOTE — Telephone Encounter (Signed)
Received call from patient.   Reports that her handicap sticker has expired and requested forms to be filled out for new sticker.   Ok to fill out?

## 2013-12-12 NOTE — Telephone Encounter (Signed)
Okay to complete?

## 2013-12-12 NOTE — Telephone Encounter (Signed)
Forms filled out for MD signature.   Call placed to patient and patient made aware to come to office to pick up.

## 2013-12-27 ENCOUNTER — Other Ambulatory Visit: Payer: Self-pay | Admitting: Family Medicine

## 2013-12-30 ENCOUNTER — Other Ambulatory Visit: Payer: Self-pay | Admitting: *Deleted

## 2013-12-30 MED ORDER — METFORMIN HCL 500 MG PO TABS: ORAL_TABLET | ORAL | Status: DC

## 2013-12-30 NOTE — Telephone Encounter (Signed)
Call received from pharmacist with [facility].   Reports that patient is confused about Metformin dosage.   States that pharmacy has on file metformin 500mg , take 2 PO BID, #120.   Patient seen on 08/24/2013. Metformin decreased at that time to 500mg  PO BID, #60.   Call placed to pharmacy to make aware and new prescription sent to pharmacy.

## 2014-01-16 ENCOUNTER — Other Ambulatory Visit: Payer: Self-pay | Admitting: Family Medicine

## 2014-02-14 ENCOUNTER — Telehealth: Payer: Self-pay | Admitting: Family Medicine

## 2014-02-14 MED ORDER — METFORMIN HCL 500 MG PO TABS: ORAL_TABLET | ORAL | Status: DC

## 2014-02-14 NOTE — Telephone Encounter (Signed)
Received Rx request form pharmacy for Metformin 500mg   Is asking for 90 day supply.  Pt has appt with you next month for 3 mth follow up.  My question is Metformin is on Medication list twice as 500 mg BID  And 500mg  2-BID.  Which is it????

## 2014-02-14 NOTE — Telephone Encounter (Signed)
500mg  1 po BID

## 2014-03-06 ENCOUNTER — Other Ambulatory Visit: Payer: Self-pay | Admitting: Family Medicine

## 2014-03-06 ENCOUNTER — Encounter: Payer: Self-pay | Admitting: Family Medicine

## 2014-03-06 ENCOUNTER — Ambulatory Visit (INDEPENDENT_AMBULATORY_CARE_PROVIDER_SITE_OTHER): Payer: Medicare Other | Admitting: Family Medicine

## 2014-03-06 VITALS — BP 130/82 | HR 78 | Temp 97.6°F | Resp 14 | Ht 62.0 in | Wt 275.0 lb

## 2014-03-06 DIAGNOSIS — R609 Edema, unspecified: Secondary | ICD-10-CM

## 2014-03-06 DIAGNOSIS — M159 Polyosteoarthritis, unspecified: Secondary | ICD-10-CM

## 2014-03-06 DIAGNOSIS — I10 Essential (primary) hypertension: Secondary | ICD-10-CM

## 2014-03-06 DIAGNOSIS — B372 Candidiasis of skin and nail: Secondary | ICD-10-CM | POA: Insufficient documentation

## 2014-03-06 DIAGNOSIS — I868 Varicose veins of other specified sites: Secondary | ICD-10-CM

## 2014-03-06 DIAGNOSIS — E119 Type 2 diabetes mellitus without complications: Secondary | ICD-10-CM

## 2014-03-06 DIAGNOSIS — I639 Cerebral infarction, unspecified: Secondary | ICD-10-CM

## 2014-03-06 DIAGNOSIS — L298 Other pruritus: Secondary | ICD-10-CM

## 2014-03-06 DIAGNOSIS — M1990 Unspecified osteoarthritis, unspecified site: Secondary | ICD-10-CM | POA: Insufficient documentation

## 2014-03-06 DIAGNOSIS — M150 Primary generalized (osteo)arthritis: Secondary | ICD-10-CM

## 2014-03-06 DIAGNOSIS — G629 Polyneuropathy, unspecified: Secondary | ICD-10-CM | POA: Insufficient documentation

## 2014-03-06 DIAGNOSIS — M8949 Other hypertrophic osteoarthropathy, multiple sites: Secondary | ICD-10-CM

## 2014-03-06 DIAGNOSIS — I8390 Asymptomatic varicose veins of unspecified lower extremity: Secondary | ICD-10-CM | POA: Insufficient documentation

## 2014-03-06 DIAGNOSIS — E7800 Pure hypercholesterolemia, unspecified: Secondary | ICD-10-CM

## 2014-03-06 DIAGNOSIS — N898 Other specified noninflammatory disorders of vagina: Secondary | ICD-10-CM

## 2014-03-06 DIAGNOSIS — R0989 Other specified symptoms and signs involving the circulatory and respiratory systems: Secondary | ICD-10-CM

## 2014-03-06 LAB — URINALYSIS, ROUTINE W REFLEX MICROSCOPIC
GLUCOSE, UA: NEGATIVE mg/dL
KETONES UR: NEGATIVE mg/dL
Nitrite: POSITIVE — AB
Protein, ur: 30 mg/dL — AB
Specific Gravity, Urine: 1.02 (ref 1.005–1.030)
UROBILINOGEN UA: 0.2 mg/dL (ref 0.0–1.0)
pH: 5.5 (ref 5.0–8.0)

## 2014-03-06 LAB — CBC WITH DIFFERENTIAL/PLATELET
Basophils Absolute: 0 10*3/uL (ref 0.0–0.1)
Basophils Relative: 0 % (ref 0–1)
EOS ABS: 0.2 10*3/uL (ref 0.0–0.7)
Eosinophils Relative: 3 % (ref 0–5)
HEMATOCRIT: 38.7 % (ref 36.0–46.0)
HEMOGLOBIN: 12.7 g/dL (ref 12.0–15.0)
Lymphocytes Relative: 13 % (ref 12–46)
Lymphs Abs: 1 10*3/uL (ref 0.7–4.0)
MCH: 30.1 pg (ref 26.0–34.0)
MCHC: 32.8 g/dL (ref 30.0–36.0)
MCV: 91.7 fL (ref 78.0–100.0)
MONO ABS: 0.6 10*3/uL (ref 0.1–1.0)
MONOS PCT: 7 % (ref 3–12)
NEUTROS PCT: 77 % (ref 43–77)
Neutro Abs: 6.1 10*3/uL (ref 1.7–7.7)
Platelets: 248 10*3/uL (ref 150–400)
RBC: 4.22 MIL/uL (ref 3.87–5.11)
RDW: 14.6 % (ref 11.5–15.5)
WBC: 7.9 10*3/uL (ref 4.0–10.5)

## 2014-03-06 LAB — LIPID PANEL
CHOL/HDL RATIO: 4.7 ratio
CHOLESTEROL: 200 mg/dL (ref 0–200)
HDL: 43 mg/dL (ref >39)
LDL CALC: 111 mg/dL — AB (ref 0–99)
Triglycerides: 228 mg/dL — ABNORMAL HIGH (ref <150)
VLDL: 46 mg/dL — ABNORMAL HIGH (ref 0–40)

## 2014-03-06 LAB — WET PREP FOR TRICH, YEAST, CLUE
TRICH WET PREP: NONE SEEN
YEAST WET PREP: NONE SEEN

## 2014-03-06 LAB — HEMOGLOBIN A1C
Hgb A1c MFr Bld: 6.9 % — ABNORMAL HIGH (ref <5.7)
Mean Plasma Glucose: 151 mg/dL — ABNORMAL HIGH (ref <117)

## 2014-03-06 LAB — URINALYSIS, MICROSCOPIC ONLY
Casts: NONE SEEN
Crystals: NONE SEEN

## 2014-03-06 LAB — MICROALBUMIN / CREATININE URINE RATIO
CREATININE, URINE: 276.7 mg/dL
Microalb Creat Ratio: 15.9 mg/g (ref 0.0–30.0)
Microalb, Ur: 4.4 mg/dL — ABNORMAL HIGH (ref <2.0)

## 2014-03-06 MED ORDER — CLOTRIMAZOLE 1 % EX CREA: 1.0000 "application " | TOPICAL_CREAM | Freq: Two times a day (BID) | CUTANEOUS | Status: DC

## 2014-03-06 NOTE — Patient Instructions (Addendum)
Carotid ultrasound to be done Use ultram for pain Compression hose for swelling and legs We will call with lab results F/u 3 months

## 2014-03-06 NOTE — Assessment & Plan Note (Signed)
Clotrimazole UA shows concern for infection but pt not symptomatic, culture obtained

## 2014-03-06 NOTE — Assessment & Plan Note (Signed)
Continue ultram  

## 2014-03-06 NOTE — Assessment & Plan Note (Signed)
Well controlled 

## 2014-03-06 NOTE — Assessment & Plan Note (Signed)
Give compression hose 15-32mmhg has varicose veins as well

## 2014-03-06 NOTE — Progress Notes (Signed)
Patient ID: Christine Zavala, female   DOB: 1933-07-31, 80 y.o.   MRN: [id number]   Subjective:    Patient ID: Christine Zavala, female    DOB: 1933-11-23, 80 y.o.   MRN: [id number]  Patient presents for 3 month F/U and Vaginal Itching  Pt here to f/u chronic medical problems  1. DM- fasting CBG 110, no hypoglycemia, tolerating metformin  2. Vaginal itching and irritation for past few weeks, has urinary incontinence wears pads. Denies dysuria.  3. H/O CVA due for repeat carotid dopplers  4. bilat foot pain, has burning sensation , swelling on and off and feet feel cold at times.  4. Hip pain- left side, on and off, worst if very active, more of an ache, no recent falls, uses Ultram as needed   Declines flu shot  D Review Of Systems:  GEN- denies fatigue, fever, weight loss,weakness, recent illness HEENT- denies eye drainage, change in vision, nasal discharge, CVS- denies chest pain, palpitations RESP- denies SOB, cough, wheeze ABD- denies N/V, change in stools, abd pain GU- denies dysuria, hematuria, dribbling, incontinence MSK- + joint pain, muscle aches, injury Neuro- denies headache, dizziness, syncope, seizure activity       Objective:    BP 130/82  Pulse 78  Temp(Src) 97.6 F (36.4 C) (Oral)  Resp 14  Ht 5\' 2"  (1.575 m)  Wt 275 lb (124.739 kg)  BMI 50.29 kg/m2 GEN- NAD, alert and oriented x3 HEENT- PERRL, EOMI, non injected sclera, pink conjunctiva, MMM, oropharynx clear Neck- Supple, no soft right bruit CVS- RRR, no murmur RESP-CTAB ABD-NABS,soft,NT,ND GU- normal external genitalia, vaginal mucosa pink and moist, erythema of labia majora, along inguinal crease, no gluteal lesions,  MSK- decreased ROM spine, Hips, knees, no pain with IR/ER of hips, neg hip rock EXT- pedal edema, + varicose veins Pulses- Radial 2+, DP- 1+        Assessment & Plan:      Problem List Items Addressed This Visit   Varicose veins   Stroke   Peripheral neuropathy   Peripheral edema   Osteoarthritis   Hypertension   High cholesterol    Other Visit Diagnoses   Vaginal itching    -  Primary    Relevant Orders       Urinalysis, Routine w reflex microscopic (Completed)       WET PREP FOR TRICH, YEAST, CLUE       Note: This dictation was prepared with Dragon dictation along with smaller phrase technology. Any transcriptional errors that result from this process are unintentional.

## 2014-03-06 NOTE — Assessment & Plan Note (Signed)
Recheck carotid dopplers Keep risk factors down

## 2014-03-06 NOTE — Assessment & Plan Note (Signed)
Recheck A1C goal around 7%, on statin and ACE

## 2014-03-08 ENCOUNTER — Other Ambulatory Visit: Payer: Self-pay | Admitting: *Deleted

## 2014-03-08 ENCOUNTER — Encounter: Payer: Self-pay | Admitting: *Deleted

## 2014-03-08 LAB — URINE CULTURE: Colony Count: >=100000

## 2014-03-08 LAB — COMPREHENSIVE METABOLIC PANEL
ALBUMIN: 3.7 g/dL (ref 3.5–5.2)
ALK PHOS: 63 U/L (ref 39–117)
ALT: 10 U/L (ref 0–35)
AST: 13 U/L (ref 0–37)
BUN: 15 mg/dL (ref 6–23)
CHLORIDE: 106 meq/L (ref 96–112)
CO2: 25 mEq/L (ref 19–32)
Calcium: 9.5 mg/dL (ref 8.4–10.5)
Creat: 1.23 mg/dL — ABNORMAL HIGH (ref 0.50–1.10)
Glucose, Bld: 155 mg/dL — ABNORMAL HIGH (ref 70–99)
POTASSIUM: 4.7 meq/L (ref 3.5–5.3)
Sodium: 142 mEq/L (ref 135–145)
TOTAL PROTEIN: 6.6 g/dL (ref 6.0–8.3)
Total Bilirubin: 0.8 mg/dL (ref 0.2–1.2)

## 2014-03-08 MED ORDER — CIPROFLOXACIN HCL 500 MG PO TABS: 500.0000 mg | ORAL_TABLET | Freq: Two times a day (BID) | ORAL | Status: DC

## 2014-03-10 ENCOUNTER — Other Ambulatory Visit: Payer: Medicare Other

## 2014-03-20 ENCOUNTER — Ambulatory Visit
Admission: RE | Admit: 2014-03-20 | Discharge: 2014-03-20 | Disposition: A | Discharge status: Home / Self Care | Payer: Medicare Other | Source: Ambulatory Visit | Attending: Family Medicine | Admitting: Family Medicine

## 2014-03-20 DIAGNOSIS — R0989 Other specified symptoms and signs involving the circulatory and respiratory systems: Secondary | ICD-10-CM

## 2014-03-20 DIAGNOSIS — I639 Cerebral infarction, unspecified: Secondary | ICD-10-CM

## 2014-04-04 ENCOUNTER — Ambulatory Visit (INDEPENDENT_AMBULATORY_CARE_PROVIDER_SITE_OTHER): Payer: Medicare Other | Admitting: Physician Assistant

## 2014-04-04 ENCOUNTER — Encounter: Payer: Self-pay | Admitting: Physician Assistant

## 2014-04-04 VITALS — BP 130/76 | HR 84 | Temp 98.5°F | Resp 20 | Wt 279.0 lb

## 2014-04-04 DIAGNOSIS — H8110 Benign paroxysmal vertigo, unspecified ear: Secondary | ICD-10-CM

## 2014-04-04 MED ORDER — CLOTRIMAZOLE 1 % EX CREA: 1.0000 "application " | TOPICAL_CREAM | Freq: Two times a day (BID) | CUTANEOUS | Status: DC

## 2014-04-04 MED ORDER — MECLIZINE HCL 25 MG PO TABS: 25.0000 mg | ORAL_TABLET | Freq: Three times a day (TID) | ORAL | Status: DC | PRN

## 2014-04-04 NOTE — Progress Notes (Signed)
Patient ID: Christine Zavala MRN: [id number], DOB: 1934-02-07, 80 y.o. Date of Encounter: 04/04/2014, 10:47 AM    Chief Complaint:  Chief Complaint  Patient presents with  . c/o vertigo    comes and goes     HPI: 80 y.o. year old white female says that she's had these exact same type of symptoms in the past.  Says that her last episode of this had been about 2 years ago. Says that she went in and did Epley maneuvers at therapy--says that that was not covered with her insurance and she had to pay about $250 out of pocket--says "she cant do that again."  Says that this episode has been occurring off and on for about a month. However yesterday the symptoms were worse. She says that the symptoms usually occur if she reaches down to pick up something. Aslo occurs she turns to the side. Says that yesterday she turned around to throw something in the trash and that's when she started really spinning badly.  says when she has these episodes, things spin in a circle. She sits down and sits still and the symptoms resolve. Says she does not experience any drunk, staggery feeling---says that anytime she starts to feel the spinning she just sits still. Says that she had no type of medication to use for this. Says that any medication that she had been prescribed for this been at least 2 years ago which was expired.     Home Meds:   Outpatient Prescriptions Prior to Visit  Medication Sig Dispense Refill  . Blood Glucose Monitoring Suppl (BLOOD GLUCOSE MONITOR KIT) KIT Pt needs glucose monitor, strips (#100) and lancets (1 box). With prn refills.Marland KitchenMarland KitchenShe checks BS bid and DX: 250.00 1 each 0  . ciprofloxacin (CIPRO) 500 MG tablet Take 1 tablet (500 mg total) by mouth 2 (two) times daily. 10 tablet 0  . clopidogrel (PLAVIX) 75 MG tablet TAKE 1 TABLET (75 MG TOTAL) BY MOUTH DAILY. 90 tablet 3  . Horse Chestnut 300 MG CAPS Take by mouth.    [name] lisinopril (PRINIVIL,ZESTRIL) 40 MG tablet TAKE 1 TABLET (40 MG  TOTAL) BY MOUTH DAILY. 90 tablet 3  . metFORMIN (GLUCOPHAGE) 500 MG tablet 1 tablet BID 180 tablet 0  . Multiple Vitamins-Minerals (CENTRUM SILVER ADULT 50+ PO) Take 1 tablet by mouth daily.    . pravastatin (PRAVACHOL) 40 MG tablet TAKE 1 TABLET (40 MG TOTAL) BY MOUTH DAILY. 90 tablet 3  . solifenacin (VESICARE) 5 MG tablet Take 5 mg by mouth daily.    [name] torsemide (DEMADEX) 20 MG tablet Take 1 tablet (20 mg total) by mouth daily. 30 tablet 3  . traMADol (ULTRAM) 50 MG tablet Take 1 tablet (50 mg total) by mouth 2 (two) times daily as needed. 45 tablet 2  . clotrimazole (LOTRIMIN) 1 % cream Apply 1 application topically 2 (two) times daily. 45 g 1   No facility-administered medications prior to visit.    Allergies:  Allergies  Allergen Reactions  . Aspirin Nausea And Vomiting  . Calcium-Containing Compounds     Legs burn  . Codeine Sulfate Nausea And Vomiting  . Lipitor [Atorvastatin]     myalgias  . Zocor [Simvastatin]     pruritis      Review of Systems: See HPI for pertinent ROS. All other ROS negative.    Physical Exam: Blood pressure 130/76, pulse 84, temperature 98.5 F (36.9 C), temperature source Oral, resp. rate 20, weight 279 lb (126.554 kg).,  Body mass index is 51.02 kg/(m^2). General: Obese WF.  Appears in no acute distress. HEENT: Normocephalic, atraumatic. Bilateral auditory canals clear, TM's are without perforation, pearly grey and translucent with reflective cone of light bilaterally.  She states she cannot climb onto exam table to do DickHallPike Maneuvers  Neck: Supple. No thyromegaly. No lymphadenopathy. Lungs: Clear bilaterally to auscultation without wheezes, rales, or rhonchi. Breathing is unlabored. Heart: Regular rhythm. No murmurs, rubs, or gallops. Msk:  Strength and tone normal for age. Extremities/Skin: Warm and dry. Neuro: Alert and oriented X 3. Moves all extremities spontaneously. Gait is normal. Romberg is normal.  Psych:  Responds to  questions appropriately with a normal affect.     ASSESSMENT AND PLAN:  80 y.o. year old female with  1. Benign paroxysmal positional vertigo, unspecified laterality - meclizine (ANTIVERT) 25 MG tablet; Take 1 tablet (25 mg total) by mouth 3 (three) times daily as needed for dizziness.  Dispense: 30 tablet; Refill: 0 Discussed proper use of medication. Follow-up if symptoms are not controlled with medication and do not resolve in the next week.  Signed, [address] [place], [place], [facility] 04/04/2014 10:47 AM

## 2014-04-14 ENCOUNTER — Other Ambulatory Visit: Payer: Self-pay | Admitting: Physician Assistant

## 2014-04-14 NOTE — Telephone Encounter (Signed)
Medication refilled per protocol. 

## 2014-04-17 ENCOUNTER — Telehealth: Payer: Self-pay | Admitting: Family Medicine

## 2014-04-17 MED ORDER — MECLIZINE HCL 25 MG PO TABS: ORAL_TABLET | ORAL | Status: DC

## 2014-04-17 NOTE — Telephone Encounter (Signed)
Prescription sent to pharmacy.

## 2014-04-17 NOTE — Telephone Encounter (Signed)
Patient left message asking for refill on her meclizine if possible  [phone]

## 2014-05-02 ENCOUNTER — Other Ambulatory Visit: Payer: Self-pay | Admitting: Family Medicine

## 2014-05-02 NOTE — Telephone Encounter (Signed)
Refill appropriate and filled per protocol. 

## 2014-06-12 ENCOUNTER — Ambulatory Visit: Payer: Medicare Other | Admitting: Family Medicine

## 2014-06-19 ENCOUNTER — Ambulatory Visit: Payer: Medicaid Other | Admitting: Family Medicine

## 2014-07-11 ENCOUNTER — Other Ambulatory Visit: Payer: Self-pay | Admitting: Family Medicine

## 2014-07-11 NOTE — Telephone Encounter (Signed)
LRF 11/29/13 #45 + 2  LOV 04/04/14  OK refill?

## 2014-07-11 NOTE — Telephone Encounter (Signed)
Okay to refill? 

## 2014-07-14 NOTE — Telephone Encounter (Signed)
rx called in

## 2014-08-03 ENCOUNTER — Other Ambulatory Visit: Payer: Self-pay | Admitting: Family Medicine

## 2014-08-03 NOTE — Telephone Encounter (Signed)
Medication filled x1 with no refills.   Requires office visit before any further refills can be given.   Letter sent.  

## 2014-08-21 ENCOUNTER — Ambulatory Visit (INDEPENDENT_AMBULATORY_CARE_PROVIDER_SITE_OTHER): Payer: Medicare Other | Admitting: Family Medicine

## 2014-08-21 ENCOUNTER — Encounter: Payer: Self-pay | Admitting: Family Medicine

## 2014-08-21 VITALS — BP 136/80 | HR 68 | Temp 97.3°F | Resp 18 | Ht 62.0 in | Wt 284.0 lb

## 2014-08-21 DIAGNOSIS — E7800 Pure hypercholesterolemia, unspecified: Secondary | ICD-10-CM

## 2014-08-21 DIAGNOSIS — I10 Essential (primary) hypertension: Secondary | ICD-10-CM | POA: Diagnosis not present

## 2014-08-21 DIAGNOSIS — K5900 Constipation, unspecified: Secondary | ICD-10-CM | POA: Diagnosis not present

## 2014-08-21 DIAGNOSIS — L299 Pruritus, unspecified: Secondary | ICD-10-CM | POA: Diagnosis not present

## 2014-08-21 DIAGNOSIS — M150 Primary generalized (osteo)arthritis: Secondary | ICD-10-CM

## 2014-08-21 DIAGNOSIS — Z8639 Personal history of other endocrine, nutritional and metabolic disease: Secondary | ICD-10-CM

## 2014-08-21 DIAGNOSIS — M5136 Other intervertebral disc degeneration, lumbar region: Secondary | ICD-10-CM | POA: Diagnosis not present

## 2014-08-21 DIAGNOSIS — M159 Polyosteoarthritis, unspecified: Secondary | ICD-10-CM

## 2014-08-21 DIAGNOSIS — E119 Type 2 diabetes mellitus without complications: Secondary | ICD-10-CM | POA: Diagnosis not present

## 2014-08-21 DIAGNOSIS — F0390 Unspecified dementia without behavioral disturbance: Secondary | ICD-10-CM | POA: Diagnosis not present

## 2014-08-21 DIAGNOSIS — M8949 Other hypertrophic osteoarthropathy, multiple sites: Secondary | ICD-10-CM

## 2014-08-21 DIAGNOSIS — Z23 Encounter for immunization: Secondary | ICD-10-CM | POA: Diagnosis not present

## 2014-08-21 DIAGNOSIS — G629 Polyneuropathy, unspecified: Secondary | ICD-10-CM | POA: Diagnosis not present

## 2014-08-21 LAB — CBC WITH DIFFERENTIAL/PLATELET
Basophils Absolute: 0 10*3/uL (ref 0.0–0.1)
Basophils Relative: 0 % (ref 0–1)
Eosinophils Absolute: 0.2 10*3/uL (ref 0.0–0.7)
Eosinophils Relative: 2 % (ref 0–5)
HCT: 37.4 % (ref 36.0–46.0)
Hemoglobin: 12.2 g/dL (ref 12.0–15.0)
Lymphocytes Relative: 16 % (ref 12–46)
Lymphs Abs: 1.3 10*3/uL (ref 0.7–4.0)
MCH: 30 pg (ref 26.0–34.0)
MCHC: 32.6 g/dL (ref 30.0–36.0)
MCV: 91.9 fL (ref 78.0–100.0)
MONO ABS: 0.6 10*3/uL (ref 0.1–1.0)
MONOS PCT: 7 % (ref 3–12)
MPV: 10.5 fL (ref 8.6–12.4)
NEUTROS ABS: 6.3 10*3/uL (ref 1.7–7.7)
Neutrophils Relative %: 75 % (ref 43–77)
Platelets: 227 10*3/uL (ref 150–400)
RBC: 4.07 MIL/uL (ref 3.87–5.11)
RDW: 14.4 % (ref 11.5–15.5)
WBC: 8.4 10*3/uL (ref 4.0–10.5)

## 2014-08-21 LAB — LIPID PANEL
Cholesterol: 206 mg/dL — ABNORMAL HIGH (ref 0–200)
HDL: 39 mg/dL — ABNORMAL LOW (ref >=46)
LDL Cholesterol: 129 mg/dL — ABNORMAL HIGH (ref 0–99)
Total CHOL/HDL Ratio: 5.3 Ratio
Triglycerides: 191 mg/dL — ABNORMAL HIGH (ref <150)
VLDL: 38 mg/dL (ref 0–40)

## 2014-08-21 LAB — COMPREHENSIVE METABOLIC PANEL
ALK PHOS: 70 U/L (ref 39–117)
ALT: 12 U/L (ref 0–35)
AST: 13 U/L (ref 0–37)
Albumin: 3.6 g/dL (ref 3.5–5.2)
BUN: 22 mg/dL (ref 6–23)
CO2: 23 mEq/L (ref 19–32)
CREATININE: 1.15 mg/dL — AB (ref 0.50–1.10)
Calcium: 8.6 mg/dL (ref 8.4–10.5)
Chloride: 106 mEq/L (ref 96–112)
Glucose, Bld: 158 mg/dL — ABNORMAL HIGH (ref 70–99)
Potassium: 4.8 mEq/L (ref 3.5–5.3)
Sodium: 139 mEq/L (ref 135–145)
Total Bilirubin: 0.6 mg/dL (ref 0.2–1.2)
Total Protein: 6.1 g/dL (ref 6.0–8.3)

## 2014-08-21 LAB — HEMOGLOBIN A1C
Hgb A1c MFr Bld: 7.1 % — ABNORMAL HIGH (ref <5.7)
MEAN PLASMA GLUCOSE: 157 mg/dL — AB (ref <117)

## 2014-08-21 MED ORDER — TRAMADOL HCL 50 MG PO TABS: ORAL_TABLET | ORAL | Status: DC

## 2014-08-21 MED ORDER — POLYETHYLENE GLYCOL 3350 17 GM/SCOOP PO POWD: 17.0000 g | Freq: Every day | ORAL | Status: DC | PRN

## 2014-08-21 NOTE — Addendum Note (Signed)
Addended by: Sheral Flow on: 08/21/2014 04:30 PM   Modules accepted: Orders

## 2014-08-21 NOTE — Assessment & Plan Note (Signed)
Continue with tramadol she can use twice a day

## 2014-08-21 NOTE — Assessment & Plan Note (Signed)
Blood pressure well controlled and change medication 

## 2014-08-21 NOTE — Assessment & Plan Note (Signed)
Noted in her chart she was on Aricept in the past in general she's done very well with regards to her memory if anything she has some mild vascular dementia which I think is suitable for her age She does not need any medications at this time

## 2014-08-21 NOTE — Progress Notes (Signed)
Patient ID: Christine Zavala, female   DOB: [birth year], 81 y.o.   MRN: [id number]   Subjective:    Patient ID: Christine Zavala, female    DOB: 05/24/1934, 81 y.o.   MRN: [id number]  Patient presents for 3 month F/U  patient here to follow-up chronic medical problems.  #1 diabetes mellitus she states her fasting sugars are about 110 and 120 she's not had any hypoglycemia no polyuria and no polydipsia. She is taking her medications as prescribed #2- constipation history of chronic constipation when she strains she has some discomfort near her abdominal Atlas hernia she is being eating raisin bran which helps her have a bowel movement about every 2-3 days but she still has some straining. #3- itching in her scalp for the past couple months. No flaking or dandruff per report. She did change her shampoo is not sure if this is caused it. She was then concerned that her metformin was causing the itchy scalp #4- allergies- watery eyes, using zyrtec and eye drops which helps #5- history of generalized osteoarthritis she's currently on tramadol she is afraid to take this more than once a day because she thought she would get addicted to it. It does help with her hip and back pain.   Review Of Systems:  GEN- denies fatigue, fever, weight loss,weakness, recent illness HEENT- denies eye drainage, change in vision, nasal discharge, CVS- denies chest pain, palpitations RESP- denies SOB, cough, wheeze ABD- denies N/V, +change in stools, abd pain GU- denies dysuria, hematuria, dribbling, incontinence MSK- + joint pain, muscle aches, injury Neuro- denies headache, dizziness, syncope, seizure activity       Objective:    BP 136/80 mmHg  Pulse 68  Temp(Src) 97.3 F (36.3 C) (Oral)  Resp 18  Ht 5\' 2"  (1.575 m)  Wt 284 lb (128.822 kg)  BMI 51.93 kg/m2 GEN- NAD, alert and oriented x3, obese, sitting in chair walks with a cane HEENT- PERRL, EOMI, non injected sclera, pink conjunctiva, MMM, oropharynx clear,nares  clear, clear watery eyes, TM clear bilat no effusion no wax impaction CVS- RRR, no murmur RESP-CTAB ABD-NABS,soft,NT,ND, ventral hernia noted- easily reduced but large pannus also has midline surgical scar nontender EXT- pedal edema, + varicose veins sKIN- SCALP mild erythema in spots, no flakes, no plaque lesions, no alopecia Pulses- Radial 2+, DP- 1+     Assessment & Plan:      Problem List Items Addressed This Visit      Unprioritized   Peripheral neuropathy   Osteoarthritis    Continue with tramadol she can use twice a day      Relevant Medications   traMADol (ULTRAM) 50 MG tablet   Itchy scalp    Change shampoo, no plaque lesions, try anti itch       Hypertension    Blood pressure well controlled and change medication      Relevant Orders   CBC with Differential/Platelet   Comprehensive metabolic panel   High cholesterol   Relevant Orders   Lipid panel   RESOLVED: H/O vitamin D deficiency   Diabetes mellitus without complication - Primary    Diabetes is well controlled. We'll recheck her A1c and her renal function today Prevnar 13 immunization given      Relevant Orders   CBC with Differential/Platelet   Comprehensive metabolic panel   Hemoglobin A1c   HM DIABETES FOOT EXAM (Completed)   Dementia    Noted in her chart she was on Aricept in the past in general she's  done very well with regards to her memory if anything she has some mild vascular dementia which I think is suitable for her age She does not need any medications at this time      DDD (degenerative disc disease), lumbar   Relevant Medications   traMADol (ULTRAM) 50 MG tablet   Constipation    Trial of Miralax for bowels Continue with fiber and water         Note: This dictation was prepared with Dragon dictation along with smaller phrase technology. Any transcriptional errors that result from this process are unintentional.

## 2014-08-21 NOTE — Assessment & Plan Note (Signed)
Trial of Miralax for bowels Continue with fiber and water

## 2014-08-21 NOTE — Assessment & Plan Note (Addendum)
Change shampoo, no plaque lesions, try anti itch  I do not think this is coming from her METFORMIN

## 2014-08-21 NOTE — Assessment & Plan Note (Signed)
Diabetes is well controlled. We'll recheck her A1c and her renal function today Prevnar 13 immunization given

## 2014-08-21 NOTE — Patient Instructions (Signed)
Take the arthritis medication twice a day  Try something for the scalp Miralax once a day for the bowels Elevate the feet Prevnar 13 given today  Check into Medicaid F/U 4 months

## 2014-08-22 ENCOUNTER — Encounter: Payer: Self-pay | Admitting: *Deleted

## 2014-09-07 ENCOUNTER — Telehealth: Payer: Self-pay | Admitting: Family Medicine

## 2014-09-07 NOTE — Telephone Encounter (Signed)
Ok to order 

## 2014-09-07 NOTE — Telephone Encounter (Signed)
Patient is calling to get a bath chair if possible  Please call her at [phone] if any questions

## 2014-09-08 NOTE — Telephone Encounter (Signed)
Order faxed to [facility] per patient request.

## 2014-09-08 NOTE — Telephone Encounter (Signed)
Okay to order?

## 2014-09-21 ENCOUNTER — Telehealth: Payer: Self-pay | Admitting: Family Medicine

## 2014-09-21 NOTE — Telephone Encounter (Signed)
Pt calling waiting for shower chair.  Told Rx was faxed to [facility].  Gave her their number to call.

## 2014-10-10 ENCOUNTER — Other Ambulatory Visit: Payer: Self-pay | Admitting: Family Medicine

## 2014-10-10 NOTE — Telephone Encounter (Signed)
Medication refilled per protocol. 

## 2014-10-12 ENCOUNTER — Telehealth: Payer: Self-pay | Admitting: *Deleted

## 2014-10-12 DIAGNOSIS — L600 Ingrowing nail: Secondary | ICD-10-CM

## 2014-10-12 NOTE — Telephone Encounter (Signed)
Pt called stating she has an ingrown toenail and would like to know if she can be referred to a podiatrist, states would like to [facility]. I informed the pt that she could schedule office visit here and see if provider can do anything and stated she would rather go to podiatrist.  Ok to place order for ingrown toenail?

## 2014-10-13 NOTE — Telephone Encounter (Signed)
Referral placed to podiatry.

## 2014-10-13 NOTE — Telephone Encounter (Signed)
Okay to place referral

## 2014-11-03 NOTE — Telephone Encounter (Signed)
error 

## 2014-11-05 ENCOUNTER — Other Ambulatory Visit: Payer: Self-pay | Admitting: Family Medicine

## 2014-12-19 ENCOUNTER — Other Ambulatory Visit: Payer: Self-pay | Admitting: Family Medicine

## 2014-12-19 NOTE — Telephone Encounter (Signed)
Refill appropriate and filled per protocol. 

## 2014-12-26 ENCOUNTER — Encounter: Payer: Self-pay | Admitting: Family Medicine

## 2014-12-26 ENCOUNTER — Ambulatory Visit (INDEPENDENT_AMBULATORY_CARE_PROVIDER_SITE_OTHER): Payer: Medicare Other | Admitting: Family Medicine

## 2014-12-26 VITALS — BP 132/70 | HR 78 | Temp 97.6°F | Resp 14 | Ht 62.0 in | Wt 274.0 lb

## 2014-12-26 DIAGNOSIS — M159 Polyosteoarthritis, unspecified: Secondary | ICD-10-CM

## 2014-12-26 DIAGNOSIS — G629 Polyneuropathy, unspecified: Secondary | ICD-10-CM | POA: Diagnosis not present

## 2014-12-26 DIAGNOSIS — M150 Primary generalized (osteo)arthritis: Secondary | ICD-10-CM | POA: Diagnosis not present

## 2014-12-26 DIAGNOSIS — E785 Hyperlipidemia, unspecified: Secondary | ICD-10-CM

## 2014-12-26 DIAGNOSIS — E119 Type 2 diabetes mellitus without complications: Secondary | ICD-10-CM | POA: Diagnosis not present

## 2014-12-26 DIAGNOSIS — S41112A Laceration without foreign body of left upper arm, initial encounter: Secondary | ICD-10-CM | POA: Diagnosis not present

## 2014-12-26 DIAGNOSIS — I10 Essential (primary) hypertension: Secondary | ICD-10-CM

## 2014-12-26 DIAGNOSIS — Z23 Encounter for immunization: Secondary | ICD-10-CM

## 2014-12-26 DIAGNOSIS — M8949 Other hypertrophic osteoarthropathy, multiple sites: Secondary | ICD-10-CM

## 2014-12-26 DIAGNOSIS — N390 Urinary tract infection, site not specified: Secondary | ICD-10-CM | POA: Diagnosis not present

## 2014-12-26 LAB — BASIC METABOLIC PANEL WITH GFR
BUN: 19 mg/dL (ref 7–25)
CALCIUM: 8.9 mg/dL (ref 8.6–10.4)
CHLORIDE: 103 mmol/L (ref 98–110)
CO2: 26 mmol/L (ref 20–31)
Creat: 1.04 mg/dL — ABNORMAL HIGH (ref 0.60–0.93)
GFR, EST AFRICAN AMERICAN: 59 mL/min — AB (ref >=60)
GFR, Est Non African American: 51 mL/min — ABNORMAL LOW (ref >=60)
Glucose, Bld: 153 mg/dL — ABNORMAL HIGH (ref 70–99)
Potassium: 4.8 mmol/L (ref 3.5–5.3)
Sodium: 138 mmol/L (ref 135–146)

## 2014-12-26 LAB — URINALYSIS, ROUTINE W REFLEX MICROSCOPIC
BILIRUBIN URINE: NEGATIVE
Glucose, UA: NEGATIVE
KETONES UR: NEGATIVE
Nitrite: POSITIVE — AB
PH: 6 (ref 5.0–8.0)
Protein, ur: NEGATIVE
Specific Gravity, Urine: 1.02 (ref 1.001–1.035)

## 2014-12-26 LAB — CBC WITH DIFFERENTIAL/PLATELET
BASOS PCT: 0 % (ref 0–1)
Basophils Absolute: 0 10*3/uL (ref 0.0–0.1)
EOS PCT: 3 % (ref 0–5)
Eosinophils Absolute: 0.2 10*3/uL (ref 0.0–0.7)
HEMATOCRIT: 39.1 % (ref 36.0–46.0)
HEMOGLOBIN: 12.7 g/dL (ref 12.0–15.0)
LYMPHS ABS: 1.2 10*3/uL (ref 0.7–4.0)
Lymphocytes Relative: 17 % (ref 12–46)
MCH: 30.2 pg (ref 26.0–34.0)
MCHC: 32.5 g/dL (ref 30.0–36.0)
MCV: 93.1 fL (ref 78.0–100.0)
MPV: 10.4 fL (ref 8.6–12.4)
Monocytes Absolute: 0.6 10*3/uL (ref 0.1–1.0)
Monocytes Relative: 8 % (ref 3–12)
Neutro Abs: 5.2 10*3/uL (ref 1.7–7.7)
Neutrophils Relative %: 72 % (ref 43–77)
Platelets: 240 10*3/uL (ref 150–400)
RBC: 4.2 MIL/uL (ref 3.87–5.11)
RDW: 14.5 % (ref 11.5–15.5)
WBC: 7.2 10*3/uL (ref 4.0–10.5)

## 2014-12-26 LAB — LIPID PANEL
CHOLESTEROL: 207 mg/dL — AB (ref 125–200)
HDL: 41 mg/dL — AB (ref >=46)
LDL Cholesterol: 128 mg/dL (ref <130)
Total CHOL/HDL Ratio: 5 Ratio (ref <=5.0)
Triglycerides: 189 mg/dL — ABNORMAL HIGH (ref <150)
VLDL: 38 mg/dL — ABNORMAL HIGH (ref <30)

## 2014-12-26 LAB — URINALYSIS, MICROSCOPIC ONLY
CASTS: NONE SEEN [LPF]
CRYSTALS: NONE SEEN [HPF]
Yeast: NONE SEEN [HPF]

## 2014-12-26 MED ORDER — TRAMADOL HCL 50 MG PO TABS: ORAL_TABLET | ORAL | Status: DC

## 2014-12-26 MED ORDER — CEPHALEXIN 500 MG PO CAPS: 500.0000 mg | ORAL_CAPSULE | Freq: Four times a day (QID) | ORAL | Status: DC

## 2014-12-26 NOTE — Patient Instructions (Addendum)
Continue current medications  We will call with lab results TDAP given  Ultram refilled Eye doctor referral F/U 4 months

## 2014-12-26 NOTE — Assessment & Plan Note (Signed)
Diabetes has been well controlled. Will recheck A1c today. No symptoms of any hypoglycemia. She is on statin drug as well as ACE inhibitor

## 2014-12-26 NOTE — Progress Notes (Signed)
Patient ID: Christine Zavala, female   DOB: 12-24-1933, 81 y.o.   MRN: [id number]   Subjective:    Patient ID: Christine Zavala, female    DOB: [birth year], 81 y.o.   MRN: [id number]  Patient presents for 4 month F/U  patient here to follow-up chronic medical problems. Diabetes mellitus her blood sugars have been about 120 less fasting she has not had any hypoglycemia she is taking her metformin as prescribed.  When she was getting up onto the scale she cut her arm which she thinks was a little metal piece. This distress at the bedside. Bleeding was controlled easily.  She is status post removal of ingrown nail this is healing up nicely.  Needs referral to eye doctor for routine visit. She has history of cataract surgery.  Burning with urination and some mild itching for the past few days. Denies any fever no new back pain no abdominal pain.    Review Of Systems:  GEN- denies fatigue, fever, weight loss,weakness, recent illness HEENT- denies eye drainage, change in vision, nasal discharge, CVS- denies chest pain, palpitations RESP- denies SOB, cough, wheeze ABD- denies N/V, change in stools, abd pain GU- + dysuria, hematuria, dribbling, incontinence MSK- denies joint pain, muscle aches, injury Neuro- denies headache, dizziness, syncope, seizure activity       Objective:    BP 132/70 mmHg  Pulse 78  Temp(Src) 97.6 F (36.4 C) (Oral)  Resp 14  Ht 5\' 2"  (1.575 m)  Wt 274 lb (124.286 kg)  BMI 50.10 kg/m2 GEN- NAD, alert and oriented x3,obese, sitting in chair HEENT- PERRL, EOMI, non injected sclera, pink conjunctiva, MMM, oropharynx clear Neck- Supple, no bruit  CVS- RRR, no murmur RESP-CTAB ABD-NABS,soft,NT,ND, no CVA tenderness, venrtal hernia EXT- pedal edema, + varicose veins Skin- left forearm- shallow 1 inch laceration- minimal bleeding Pulses- Radial, DP- 2+        Assessment & Plan:      Problem List Items Addressed This Visit    Peripheral neuropathy   Osteoarthritis    Relevant Medications   traMADol (ULTRAM) 50 MG tablet   Hypertension    Blood pressure well controlled to change her medication      Relevant Orders   CBC with Differential/Platelet   Hyperlipidemia   Relevant Orders   Lipid panel   Diabetes mellitus without complication    Diabetes has been well controlled. Will recheck A1c today. No symptoms of any hypoglycemia. She is on statin drug as well as ACE inhibitor      Relevant Orders   Hemoglobin H4T   BASIC METABOLIC PANEL WITH GFR    Other Visit Diagnoses    UTI (lower urinary tract infection)    -  Primary    Start keflex, send for culture    Relevant Medications    cephALEXin (KEFLEX) 500 MG capsule    Other Relevant Orders    Urinalysis, Routine w reflex microscopic (not at [facility]) (Completed)    Urine culture    Laceration of arm, left, initial encounter        Pt cut arm in our office on small pieice of metal assumed, will give TDAP, dressed in office Triple antibiotic ointment       Note: This dictation was prepared with Dragon dictation along with smaller phrase technology. Any transcriptional errors that result from this process are unintentional.

## 2014-12-26 NOTE — Assessment & Plan Note (Signed)
Blood pressure well controlled to change her medication

## 2014-12-27 ENCOUNTER — Encounter: Payer: Self-pay | Admitting: *Deleted

## 2014-12-27 LAB — HEMOGLOBIN A1C
HEMOGLOBIN A1C: 7.1 % — AB (ref <5.7)
MEAN PLASMA GLUCOSE: 157 mg/dL — AB (ref <117)

## 2014-12-28 LAB — URINE CULTURE

## 2015-02-12 ENCOUNTER — Telehealth: Payer: Self-pay | Admitting: Family Medicine

## 2015-02-12 DIAGNOSIS — H9313 Tinnitus, bilateral: Secondary | ICD-10-CM

## 2015-02-12 NOTE — Telephone Encounter (Signed)
Patient calling to say that she would like referral for ringing in ears if possible to a specialists  [phone]

## 2015-02-14 NOTE — Telephone Encounter (Signed)
OK to place referral to ENT?

## 2015-02-15 NOTE — Telephone Encounter (Signed)
Referral placed to ENT

## 2015-02-15 NOTE — Telephone Encounter (Signed)
ok 

## 2015-02-27 LAB — HM DIABETES EYE EXAM

## 2015-03-01 ENCOUNTER — Encounter: Payer: Self-pay | Admitting: Family Medicine

## 2015-03-07 ENCOUNTER — Telehealth: Payer: Self-pay | Admitting: *Deleted

## 2015-03-07 NOTE — Telephone Encounter (Signed)
I called and spoke to daughter and she relayed that pt has had bilateral tinnitus for the last month.  She had evaluation from ENT, high freq hearing loss.  Not bad enough for aides.   For the last week has had headache,  Front/side of head.  No signs of stroke.  (weakness, vision loss, slurred speech, etc).  Last seen 2014.  Made appt for eval for 03-12-15 with Dr. Krista Blue.  Had PE with pcp 2 months ago.   If pt worsens to see pcp or go to ED.  Daughter verbalized understanding.

## 2015-03-12 ENCOUNTER — Ambulatory Visit (INDEPENDENT_AMBULATORY_CARE_PROVIDER_SITE_OTHER): Payer: Medicare Other | Admitting: Neurology

## 2015-03-12 ENCOUNTER — Encounter: Payer: Self-pay | Admitting: Neurology

## 2015-03-12 VITALS — BP 126/72 | HR 72 | Ht 62.0 in | Wt 267.0 lb

## 2015-03-12 DIAGNOSIS — G6289 Other specified polyneuropathies: Secondary | ICD-10-CM

## 2015-03-12 DIAGNOSIS — H9313 Tinnitus, bilateral: Secondary | ICD-10-CM

## 2015-03-12 DIAGNOSIS — R51 Headache: Secondary | ICD-10-CM | POA: Diagnosis not present

## 2015-03-12 DIAGNOSIS — R519 Headache, unspecified: Secondary | ICD-10-CM

## 2015-03-12 DIAGNOSIS — R269 Unspecified abnormalities of gait and mobility: Secondary | ICD-10-CM

## 2015-03-12 NOTE — Progress Notes (Signed)
PATIENT: Christine Zavala DOB: 13-Feb-1934  Chief Complaint  Patient presents with  . Tinnitus    She is here with her daughter, [name]. She started having ringing her bilateral ears about six weeks ago.  She has recently had a normal ENT exam.  . Headache    She has been having three headache days per week.  She does not take medications for her pain but just lays down to rest.  . Peripheral Neuropathy    Says her symptoms have resolved.  She is easily able to ambulate with the assistance of a cane.     HISTORICAL  Christine Zavala is a 81 years old right-handed female, accompanied by her daughter [name], seen in refer by Dr. Alycia Rossetti in March 12 2015 for evaluation of acute onset bilateral tinnitus, headaches,  I have saw her previously in 2014, she had a history of hypertension, hyperlipidemia, obesity, reported a history of stroke in May 2014, presented with falling, right side weakness, gait difficulty   I have personally reviewed MRI of the brain 2014, No evidence of intracranial metastatic disease. Remote left hemisphere infarct and small vessel disease type changes as noted above. Tiny meningioma right anterior parafalcine region incidentally noted.  Minimal partial opacification inferior right mastoid air cells.  She now ambulates with a cane, she lives at home with her son,  Today she comes in with new complaints of sudden onset bilateral tinnitus woke up one morning in September 2016, which has been present ever since, it is constant high-pitched sound, She denies hearing loss, when she moved her head quickly, she fell fluid moving her ears, she denied vertigo, at the same time she complains of constant 5 out of 10 bifrontal dull pressure headaches,  She had history of rectal cancer, status post colectomy, had a transient colostomy, no longer has it, she had a chemotherapy plan in 2004, complained of bilateral feet paresthesia, which has much improved. She also complains of  bilateral hip pain, gait difficulty because of the pain  REVIEW OF SYSTEMS: Full 14 system review of systems performed and notable only for as above  ALLERGIES: Allergies  Allergen Reactions  . Aspirin Nausea And Vomiting  . Calcium-Containing Compounds     Legs burn  . Codeine Sulfate Nausea And Vomiting  . Lipitor [Atorvastatin]     myalgias  . Zocor [Simvastatin]     pruritis  . Zyrtec [Cetirizine]     HOME MEDICATIONS: Current Outpatient Prescriptions  Medication Sig Dispense Refill  . Blood Glucose Monitoring Suppl (BLOOD GLUCOSE MONITOR KIT) KIT Pt needs glucose monitor, strips (#100) and lancets (1 box). With prn refills.Marland KitchenMarland KitchenShe checks BS bid and DX: 250.00 1 each 0  . clopidogrel (PLAVIX) 75 MG tablet TAKE 1 TABLET (75 MG TOTAL) BY MOUTH DAILY. 90 tablet 1  . lisinopril (PRINIVIL,ZESTRIL) 40 MG tablet TAKE 1 TABLET (40 MG TOTAL) BY MOUTH DAILY. 90 tablet 3  . metFORMIN (GLUCOPHAGE) 500 MG tablet TAKE 1 TABLET TWICE A DAY 180 tablet 2  . Multiple Vitamins-Minerals (CENTRUM SILVER ADULT 50+ PO) Take 1 tablet by mouth daily.    . pravastatin (PRAVACHOL) 40 MG tablet TAKE 1 TABLET EVERY DAY 90 tablet 0  . traMADol (ULTRAM) 50 MG tablet TAKE 1 TABLET BY MOUTH TWICE A DAY AS NEEDED FOR PAIN. MUST LAST 30 DAYS 60 tablet 4   No current facility-administered medications for this visit.    PAST MEDICAL HISTORY: Past Medical History  Diagnosis Date  . Cancer (  [place])     Rectal  . DVT (deep venous thrombosis) ([place])   . Cataract   . Hyperlipidemia   . Hypertension   . Diabetes mellitus without complication ([place])   . High cholesterol   . Dementia   . Stroke ([place])   . H/O vitamin D deficiency   . Osteoporosis     PAST SURGICAL HISTORY: Past Surgical History  Procedure Laterality Date  . Colon surgery      2004  . Gallbladder surgery      2001  . Hernia repair      2004  . Cataract extraction, bilateral      2001  . Ovary removed      2002    FAMILY  HISTORY: Family History  Problem Relation Age of Onset  . Cancer Mother   . Dementia Father   . COPD Mother     SOCIAL HISTORY:  Social History   Social History  . Marital Status: Widowed    Spouse Name: N/A  . Number of Children: 2  . Years of Education: 12   Occupational History  . retired    Social History Main Topics  . Smoking status: Former Research scientist (life sciences)  . Smokeless tobacco: Never Used     Comment: quit 1970's  . Alcohol Use: No  . Drug Use: No  . Sexual Activity: Not on file   Other Topics Concern  . Not on file   Social History Narrative   Patient lives at home with her Son Sheliah Plane). Patient is a widowed. Patient is retired. Right handed.   Caffeine - one cup daily     PHYSICAL EXAM   Filed Vitals:   03/12/15 1114  BP: 126/72  Pulse: 72  Height: _0  (1.575 m)  Weight: 267 lb (121.11 kg)    Not recorded      Body mass index is 48.82 kg/(m^2).  PHYSICAL EXAMNIATION:  Gen: NAD, conversant, well nourised, obese, well groomed                     Cardiovascular: Regular rate rhythm, no peripheral edema, warm, nontender. Eyes: Conjunctivae clear without exudates or hemorrhage Neck: Supple, no carotid bruise. Pulmonary: Clear to auscultation bilaterally   NEUROLOGICAL EXAM:  MENTAL STATUS: Speech:    Speech is normal; fluent and spontaneous with normal comprehension.  Cognition:     Orientation to time, place and person     Normal recent and remote memory     Normal Attention span and concentration     Normal Language, naming, repeating,spontaneous speech     Fund of knowledge   CRANIAL NERVES: CN II: Visual fields are full to confrontation. Fundoscopic exam is normal with sharp discs and no vascular changes. Pupils are round equal and briskly reactive to light. CN III, IV, VI: extraocular movement are normal. No ptosis. CN V: Facial sensation is intact to pinprick in all 3 divisions bilaterally. Corneal responses are intact.  CN VII:  Face is symmetric with normal eye closure and smile. CN VIII: Hearing is normal to rubbing fingers CN IX, X: Palate elevates symmetrically. Phonation is normal. CN XI: Head turning and shoulder shrug are intact CN XII: Tongue is midline with normal movements and no atrophy.  MOTOR: There is no pronator drift of out-stretched arms. Muscle bulk and tone are normal. Muscle strength is normal.  REFLEXES: Reflexes are 2+ and symmetric at the biceps, triceps, knees, and ankles. Plantar responses are flexor.  SENSORY:  Intact to light touch, pinprick, position sense, and vibration sense are intact in fingers and toes.  COORDINATION: Rapid alternating movements and fine finger movements are intact. There is no dysmetria on finger-to-nose and heel-knee-shin.    GAIT/STANCE: Need to push on chair arm to get up from seated position, antalgic, cautious,  Romberg is absent.   DIAGNOSTIC DATA (LABS, IMAGING, TESTING) - I reviewed patient records, labs, notes, testing and imaging myself where available.   ASSESSMENT AND PLAN  Aniella Wandrey is a 81 y.o. female   Acute onset bilateral tinnitus  MRI of brain without contrast, the most recent creatinine was 1.5  Most likely small vascular etiology Gait difficulty  Due to bilateral hip pain   Marcial Pacas, M.D. Ph.D.  [facility] [address] [place], [place] [address] Ph: [phone] Fax: [phone]  CC: Alycia Rossetti, MD

## 2015-03-17 ENCOUNTER — Other Ambulatory Visit: Payer: Self-pay | Admitting: Family Medicine

## 2015-03-19 NOTE — Telephone Encounter (Signed)
Refill appropriate and filled per protocol. 

## 2015-03-28 ENCOUNTER — Ambulatory Visit
Admission: RE | Admit: 2015-03-28 | Discharge: 2015-03-28 | Disposition: A | Discharge status: Home / Self Care | Payer: Medicare Other | Source: Ambulatory Visit | Attending: Neurology | Admitting: Neurology

## 2015-03-28 DIAGNOSIS — G6289 Other specified polyneuropathies: Secondary | ICD-10-CM

## 2015-03-28 DIAGNOSIS — H9313 Tinnitus, bilateral: Secondary | ICD-10-CM

## 2015-03-28 DIAGNOSIS — R51 Headache: Secondary | ICD-10-CM

## 2015-03-28 DIAGNOSIS — R269 Unspecified abnormalities of gait and mobility: Secondary | ICD-10-CM | POA: Diagnosis not present

## 2015-03-28 DIAGNOSIS — R519 Headache, unspecified: Secondary | ICD-10-CM

## 2015-03-30 ENCOUNTER — Telehealth: Payer: Self-pay | Admitting: Neurology

## 2015-03-30 NOTE — Telephone Encounter (Signed)
Christine Zavala, please call patient MRI of the brain showed evidence of previous stroke, mild brain atrophy, no change from previous scan in May 2014  IMPRESSION:  Abnormal MRI brain (without) demonstrating: 1. Mild periventricular and subcortical chronic small vessel ischemic disease.  2. Remote left frontal subcortical and periventricular ischemic infarction. 3. Mild perisylvian atrophy. 4. No acute findings. 5. No change from MRI on 09/26/12.

## 2015-03-30 NOTE — Telephone Encounter (Addendum)
LVM for pt daughter to call about results per DPR. Gave [facility] phone number and hours.

## 2015-04-02 NOTE — Telephone Encounter (Signed)
Spoke to [place] (pt's dgt on HIPPA) - she is aware of results and will keep follow up for further review.

## 2015-04-12 ENCOUNTER — Other Ambulatory Visit: Payer: Self-pay | Admitting: Family Medicine

## 2015-04-12 ENCOUNTER — Ambulatory Visit (INDEPENDENT_AMBULATORY_CARE_PROVIDER_SITE_OTHER): Payer: Medicare Other | Admitting: Neurology

## 2015-04-12 ENCOUNTER — Encounter: Payer: Self-pay | Admitting: Neurology

## 2015-04-12 VITALS — BP 124/72 | HR 74 | Resp 16 | Ht 62.0 in | Wt 269.0 lb

## 2015-04-12 DIAGNOSIS — Z8673 Personal history of transient ischemic attack (TIA), and cerebral infarction without residual deficits: Secondary | ICD-10-CM

## 2015-04-12 DIAGNOSIS — R269 Unspecified abnormalities of gait and mobility: Secondary | ICD-10-CM | POA: Insufficient documentation

## 2015-04-12 DIAGNOSIS — Z85048 Personal history of other malignant neoplasm of rectum, rectosigmoid junction, and anus: Secondary | ICD-10-CM

## 2015-04-12 NOTE — Telephone Encounter (Signed)
Medication refilled per protocol. 

## 2015-04-12 NOTE — Progress Notes (Signed)
Chief Complaint  Patient presents with  . Tinnitus    Kerrigan is here with her dtr. Christine Zavala to discuss MRI results.  Sts. tinnitus is some improved--still constant, but less noisy.  Sts. neuropathy is about the same./fim  . Headaches      PATIENT: Christine Zavala DOB: May 18, 1934  Chief Complaint  Patient presents with  . Tinnitus    Christine Zavala is here with her dtr. Christine Zavala to discuss MRI results.  Sts. tinnitus is some improved--still constant, but less noisy.  Sts. neuropathy is about the same./fim  . Headaches     HISTORICAL  Christine Zavala is a 81 years old right-handed female, accompanied by her daughter Christine Zavala, seen in refer by Dr. Alycia Rossetti in March 12 2015 for evaluation of acute onset bilateral tinnitus, headaches,  I have saw her previously in 2014, she had a history of hypertension, hyperlipidemia, obesity, reported a history of stroke in May 2014, presented with falling, right side weakness, gait difficulty   I have personally reviewed MRI of the brain 2014, No evidence of intracranial metastatic disease. Remote left hemisphere infarct and small vessel disease type changes as noted above. Tiny meningioma right anterior parafalcine region incidentally noted.  Minimal partial opacification inferior right mastoid air cells.  She now ambulates with a cane, she lives at home with her son,  Today she comes in with new complaints of sudden onset bilateral tinnitus woke up one morning in September 2016, which has been present ever since, it is constant high-pitched sound, She denies hearing loss, when she moved her head quickly, she fell fluid moving her ears, she denied vertigo, at the same time she complains of constant 5 out of 10 bifrontal dull pressure headaches,  She had history of rectal cancer, status post colectomy, had a transient colostomy, no longer has it, she had a chemotherapy plan in 2004, complained of bilateral feet paresthesia, which has much improved. She also complains of  bilateral hip pain, gait difficulty because of the pain  UPDATE Apr 12 2015: She continue has gait difficulty, rely on the cane, high-pitched tinnitus, was evaluated by ENT, was found to have high frequency hearing loss, occasionally, her tinnitus were stopped, she can sleep better, but recurred again,  We have reviewed MRI brain film in Mar 29 2015: Mild periventricular and subcortical chronic small vessel ischemic disease. Remote left frontal subcortical and periventricular ischemic infarction. Mild perisylvian atrophy. No acute findings.  No change from MRI on 09/26/12.   She could not tolerate aspirin due to GI side effect, is taking Plavix 75 mg daily  REVIEW OF SYSTEMS: Full 14 system review of systems performed and notable only for as above  ALLERGIES: Allergies  Allergen Reactions  . Aspirin Nausea And Vomiting  . Calcium-Containing Compounds     Legs burn  . Codeine Sulfate Nausea And Vomiting  . Lipitor [Atorvastatin]     myalgias  . Zocor [Simvastatin]     pruritis  . Zyrtec [Cetirizine]     HOME MEDICATIONS: Current Outpatient Prescriptions  Medication Sig Dispense Refill  . Blood Glucose Monitoring Suppl (BLOOD GLUCOSE MONITOR KIT) KIT Pt needs glucose monitor, strips (#100) and lancets (1 box). With prn refills.Marland KitchenMarland KitchenShe checks BS bid and DX: 250.00 1 each 0  . clopidogrel (PLAVIX) 75 MG tablet TAKE 1 TABLET (75 MG TOTAL) BY MOUTH DAILY. 90 tablet 1  . lisinopril (PRINIVIL,ZESTRIL) 40 MG tablet TAKE 1 TABLET (40 MG TOTAL) BY MOUTH DAILY. 90 tablet 3  . metFORMIN (GLUCOPHAGE)  500 MG tablet TAKE 1 TABLET TWICE A DAY 180 tablet 2  . Multiple Vitamins-Minerals (CENTRUM SILVER ADULT 50+ PO) Take 1 tablet by mouth daily.    . ONE TOUCH ULTRA TEST test strip     . pravastatin (PRAVACHOL) 40 MG tablet TAKE 1 TABLET EVERY DAY 90 tablet 0  . traMADol (ULTRAM) 50 MG tablet TAKE 1 TABLET BY MOUTH TWICE A DAY AS NEEDED FOR PAIN. MUST LAST 30 DAYS 60 tablet 4   No current  facility-administered medications for this visit.    PAST MEDICAL HISTORY: Past Medical History  Diagnosis Date  . Cancer ([place])     Rectal  . DVT (deep venous thrombosis) (Christine Zavala)   . Cataract   . Hyperlipidemia   . Hypertension   . Diabetes mellitus without complication (Christine Zavala)   . High cholesterol   . Dementia   . Stroke (Christine Zavala)   . H/O vitamin D deficiency   . Osteoporosis     PAST SURGICAL HISTORY: Past Surgical History  Procedure Laterality Date  . Colon surgery      2004  . Gallbladder surgery      2001  . Hernia repair      2004  . Cataract extraction, bilateral      2001  . Ovary removed      2002    FAMILY HISTORY: Family History  Problem Relation Age of Onset  . Cancer Mother   . Dementia Father   . COPD Mother     SOCIAL HISTORY:  Social History   Social History  . Marital Status: Widowed    Spouse Name: N/A  . Number of Children: 2  . Years of Education: 12   Occupational History  . retired    Social History Main Topics  . Smoking status: Former Research scientist (life sciences)  . Smokeless tobacco: Never Used     Comment: quit 1970's  . Alcohol Use: No  . Drug Use: No  . Sexual Activity: Not on file   Other Topics Concern  . Not on file   Social History Narrative   Patient lives at home with her Son Sheliah Plane). Patient is a widowed. Patient is retired. Right handed.   Caffeine - one cup daily     PHYSICAL EXAM   Filed Vitals:   04/12/15 0836  BP: 124/72  Pulse: 74  Resp: 16  Height: _0  (1.575 m)  Weight: 269 lb (122.018 kg)    Not recorded      Body mass index is 49.19 kg/(m^2).  PHYSICAL EXAMNIATION:  Gen: NAD, conversant, well nourised, obese, well groomed                     Cardiovascular: Regular rate rhythm, no peripheral edema, warm, nontender. Eyes: Conjunctivae clear without exudates or hemorrhage Neck: Supple, no carotid bruise. Pulmonary: Clear to auscultation bilaterally   NEUROLOGICAL EXAM:  MENTAL STATUS: Speech:     Speech is normal; fluent and spontaneous with normal comprehension.  Cognition:     Orientation to time, place and person     Normal recent and remote memory     Normal Attention span and concentration     Normal Language, naming, repeating,spontaneous speech     Fund of knowledge   CRANIAL NERVES: CN II: Visual fields are full to confrontation. Fundoscopic exam is normal with sharp discs and no vascular changes. Pupils are round equal and briskly reactive to light. CN III, IV, VI: extraocular movement are normal.  No ptosis. CN V: Facial sensation is intact to pinprick in all 3 divisions bilaterally. Corneal responses are intact.  CN VII: Face is symmetric with normal eye closure and smile. CN VIII: Hearing is normal to rubbing fingers CN IX, X: Palate elevates symmetrically. Phonation is normal. CN XI: Head turning and shoulder shrug are intact CN XII: Tongue is midline with normal movements and no atrophy.  MOTOR: There is no pronator drift of out-stretched arms. Muscle bulk and tone are normal. Muscle strength is normal.  REFLEXES: Reflexes are 2+ and symmetric at the biceps, triceps, knees, and ankles. Plantar responses are flexor.  SENSORY: Intact to light touch, pinprick, position sense, and vibration sense are intact in fingers and toes.  COORDINATION: Rapid alternating movements and fine finger movements are intact. There is no dysmetria on finger-to-nose and heel-knee-shin.    GAIT/STANCE: Need to push on chair arm to get up from seated position, antalgic, cautious,  Romberg is absent.   DIAGNOSTIC DATA (LABS, IMAGING, TESTING) - I reviewed patient records, labs, notes, testing and imaging myself where available. ASSESSMENT AND PLAN  Adraine Biffle is a 81 y.o. female    Acute onset bilateral tinnitus  Evidence of small vessel disease on MRI of the brain  I have advised patient to keep well hydration  Plavix 75 mg daily  Gait difficulty  Due to bilateral hip pain,  obesity, deconditioning  Refer her to home physical therapy.   Marcial Pacas, M.D. Ph.D.  [facility] [address] [place], [place] [address] Ph: [phone] Fax: [phone]  CC: Alycia Rossetti, MD

## 2015-05-01 ENCOUNTER — Ambulatory Visit: Payer: Medicare Other | Admitting: Family Medicine

## 2015-05-23 ENCOUNTER — Other Ambulatory Visit: Payer: Self-pay | Admitting: Family Medicine

## 2015-06-05 ENCOUNTER — Ambulatory Visit: Payer: Medicare Other | Admitting: Family Medicine

## 2015-06-12 ENCOUNTER — Other Ambulatory Visit: Payer: Self-pay

## 2015-06-12 DIAGNOSIS — Z1231 Encounter for screening mammogram for malignant neoplasm of breast: Secondary | ICD-10-CM

## 2015-06-19 ENCOUNTER — Other Ambulatory Visit: Payer: Self-pay | Admitting: Family Medicine

## 2015-06-19 NOTE — Telephone Encounter (Signed)
Ok to refill??  Last office visit/ refill 12/26/2014, #4 refills.

## 2015-06-19 NOTE — Telephone Encounter (Signed)
okay

## 2015-06-19 NOTE — Telephone Encounter (Signed)
Medication called to pharmacy. 

## 2015-06-27 ENCOUNTER — Ambulatory Visit (INDEPENDENT_AMBULATORY_CARE_PROVIDER_SITE_OTHER): Payer: Medicare Other | Admitting: Family Medicine

## 2015-06-27 ENCOUNTER — Encounter: Payer: Self-pay | Admitting: Family Medicine

## 2015-06-27 VITALS — BP 122/72 | HR 70 | Temp 98.0°F | Resp 18 | Wt 260.0 lb

## 2015-06-27 DIAGNOSIS — E119 Type 2 diabetes mellitus without complications: Secondary | ICD-10-CM

## 2015-06-27 DIAGNOSIS — G6289 Other specified polyneuropathies: Secondary | ICD-10-CM | POA: Diagnosis not present

## 2015-06-27 DIAGNOSIS — I10 Essential (primary) hypertension: Secondary | ICD-10-CM | POA: Diagnosis not present

## 2015-06-27 DIAGNOSIS — E785 Hyperlipidemia, unspecified: Secondary | ICD-10-CM

## 2015-06-27 DIAGNOSIS — Z85048 Personal history of other malignant neoplasm of rectum, rectosigmoid junction, and anus: Secondary | ICD-10-CM

## 2015-06-27 DIAGNOSIS — Z8673 Personal history of transient ischemic attack (TIA), and cerebral infarction without residual deficits: Secondary | ICD-10-CM | POA: Diagnosis not present

## 2015-06-27 DIAGNOSIS — R42 Dizziness and giddiness: Secondary | ICD-10-CM | POA: Insufficient documentation

## 2015-06-27 LAB — COMPREHENSIVE METABOLIC PANEL
ALK PHOS: 70 U/L (ref 33–130)
ALT: 10 U/L (ref 6–29)
AST: 13 U/L (ref 10–35)
Albumin: 3.6 g/dL (ref 3.6–5.1)
BILIRUBIN TOTAL: 1 mg/dL (ref 0.2–1.2)
BUN: 18 mg/dL (ref 7–25)
CO2: 26 mmol/L (ref 20–31)
CREATININE: 1.11 mg/dL — AB (ref 0.60–0.93)
Calcium: 8.8 mg/dL (ref 8.6–10.4)
Chloride: 104 mmol/L (ref 98–110)
Glucose, Bld: 114 mg/dL — ABNORMAL HIGH (ref 70–99)
Potassium: 4.6 mmol/L (ref 3.5–5.3)
SODIUM: 139 mmol/L (ref 135–146)
TOTAL PROTEIN: 6.2 g/dL (ref 6.1–8.1)

## 2015-06-27 LAB — CBC WITH DIFFERENTIAL/PLATELET
BASOS ABS: 0 10*3/uL (ref 0.0–0.1)
BASOS PCT: 0 % (ref 0–1)
EOS ABS: 0.2 10*3/uL (ref 0.0–0.7)
Eosinophils Relative: 2 % (ref 0–5)
HCT: 38.7 % (ref 36.0–46.0)
Hemoglobin: 12.3 g/dL (ref 12.0–15.0)
LYMPHS ABS: 1.5 10*3/uL (ref 0.7–4.0)
Lymphocytes Relative: 18 % (ref 12–46)
MCH: 29.7 pg (ref 26.0–34.0)
MCHC: 31.8 g/dL (ref 30.0–36.0)
MCV: 93.5 fL (ref 78.0–100.0)
MPV: 10.1 fL (ref 8.6–12.4)
Monocytes Absolute: 0.7 10*3/uL (ref 0.1–1.0)
Monocytes Relative: 8 % (ref 3–12)
NEUTROS ABS: 5.9 10*3/uL (ref 1.7–7.7)
NEUTROS PCT: 72 % (ref 43–77)
PLATELETS: 232 10*3/uL (ref 150–400)
RBC: 4.14 MIL/uL (ref 3.87–5.11)
RDW: 14.8 % (ref 11.5–15.5)
WBC: 8.2 10*3/uL (ref 4.0–10.5)

## 2015-06-27 LAB — LIPID PANEL
Cholesterol: 214 mg/dL — ABNORMAL HIGH (ref 125–200)
HDL: 43 mg/dL — ABNORMAL LOW (ref >=46)
LDL CALC: 131 mg/dL — AB (ref <130)
Total CHOL/HDL Ratio: 5 Ratio (ref <=5.0)
Triglycerides: 201 mg/dL — ABNORMAL HIGH (ref <150)
VLDL: 40 mg/dL — ABNORMAL HIGH (ref <30)

## 2015-06-27 LAB — HEMOGLOBIN A1C
HEMOGLOBIN A1C: 6.7 % — AB (ref <5.7)
Mean Plasma Glucose: 146 mg/dL — ABNORMAL HIGH (ref <117)

## 2015-06-27 MED ORDER — MECLIZINE HCL 25 MG PO TABS: 25.0000 mg | ORAL_TABLET | Freq: Three times a day (TID) | ORAL | Status: DC | PRN

## 2015-06-27 NOTE — Assessment & Plan Note (Signed)
Diabetes has been well controlled. No change in medication recheck labs today she is on aspirin

## 2015-06-27 NOTE — Patient Instructions (Addendum)
Take the meclizine as needed for vertigo We will call with lab results  Take 1/2 tablet of the blood pressure- Lisinopril  F/U 4 months

## 2015-06-27 NOTE — Assessment & Plan Note (Signed)
BPV, given meclizine prn.  she has been evaluated for the tinnitus I do not see any annual second be added at this time.

## 2015-06-27 NOTE — Progress Notes (Signed)
Patient ID: Christine Zavala, female   DOB: 11-16-1933, 82 y.o.   MRN: [id number]    Subjective:    Patient ID: Christine Zavala, female    DOB: 04/10/1934, 82 y.o.   MRN: [id number]  Patient presents for Follow-up and Tinnitus pt here for follow-up ear she is history of diabetes mellitus fairly well controlled her last A1c 7.1%. Blood sugars have been 120 or less fasting She also is hyperlipidemia hypertension history of cerebrovascular event. She was actually seen by neurology a couple months back she initially was seen secondary to her stroke but also began having bilateral ringing in her ears and headache. She had MRI of the brain done which showed small vessel disease she was started on Plavix. She continues to have some ringing in her ears. She has had one episode which she bent over to pick up something really quick and  The room was spinning after that. She has had some vertigo in the past but is been quite some time.  she also met that it feels like her blood pressure is low low at times she feels fatigued and a low week.   Review Of Systems:  GEN- +fatigue, fever, weight loss,weakness, recent illness HEENT- denies eye drainage, change in vision, nasal discharge, CVS- denies chest pain, palpitations RESP- denies SOB, cough, wheeze ABD- denies N/V, change in stools, abd pain GU- denies dysuria, hematuria, dribbling, incontinence MSK- denies joint pain, muscle aches, injury Neuro- denies headache, +dizziness, syncope, seizure activity       Objective:    BP 122/72 mmHg  Pulse 70  Temp(Src) 98 F (36.7 C) (Oral)  Resp 18  Wt 260 lb (117.935 kg) GEN- NAD, alert and oriented x3,obese, sitting in chair HEENT- PERRL, EOMI, non injected sclera, pink conjunctiva, MMM, oropharynx clear Neck- Supple, no bruit  CVS- RRR, no murmur RESP-CTAB ABD-NABS,soft,NT,ND, no CVA tenderness, venrtal hernia EXT- pedal edema, + varicose veins        Assessment & Plan:      Problem List Items  Addressed This Visit    Vertigo    BPV, given meclizine prn.  she has been evaluated for the tinnitus I do not see any annual second be added at this time.      Peripheral neuropathy (HCC) - Primary   Hypertension     Her blood pressure assessment a little on the lower side. I've given him prescription so they can monitor this at home. She is also felt a little fatigued about a cup back her lisinopril to 20 mg once a day      Hyperlipidemia   Relevant Orders   Lipid panel   History of rectal cancer   History of CVA (cerebrovascular accident)   Diabetes mellitus without complication ([place])     Diabetes has been well controlled. No change in medication recheck labs today she is on aspirin      Relevant Orders   CBC with Differential/Platelet   Comprehensive metabolic panel   Hemoglobin A1c      Note: This dictation was prepared with Dragon dictation along with smaller phrase technology. Any transcriptional errors that result from this process are unintentional.

## 2015-06-27 NOTE — Assessment & Plan Note (Signed)
Her blood pressure assessment a little on the lower side. I've given him prescription so they can monitor this at home. She is also felt a little fatigued about a cup back her lisinopril to 20 mg once a day

## 2015-07-04 ENCOUNTER — Ambulatory Visit
Admission: RE | Admit: 2015-07-04 | Discharge: 2015-07-04 | Disposition: A | Discharge status: Home / Self Care | Payer: Medicare Other | Source: Ambulatory Visit

## 2015-07-04 DIAGNOSIS — Z1231 Encounter for screening mammogram for malignant neoplasm of breast: Secondary | ICD-10-CM

## 2015-07-09 ENCOUNTER — Telehealth: Payer: Self-pay | Admitting: Family Medicine

## 2015-07-09 NOTE — Telephone Encounter (Signed)
Patients daughter calling regarding her medications and some questions she had about her last visit  [phone]

## 2015-07-10 NOTE — Telephone Encounter (Signed)
Pt has been called and taken care of and questions were answered

## 2015-08-24 ENCOUNTER — Other Ambulatory Visit: Payer: Self-pay | Admitting: Family Medicine

## 2015-08-24 NOTE — Telephone Encounter (Signed)
Refill appropriate and filled per protocol. 

## 2015-09-14 ENCOUNTER — Other Ambulatory Visit: Payer: Self-pay | Admitting: Family Medicine

## 2015-09-14 NOTE — Telephone Encounter (Signed)
Refill appropriate and filled per protocol. 

## 2015-09-28 ENCOUNTER — Other Ambulatory Visit: Payer: Self-pay | Admitting: Family Medicine

## 2015-09-28 NOTE — Telephone Encounter (Signed)
Refill appropriate and filled per protocol. 

## 2015-10-04 ENCOUNTER — Other Ambulatory Visit: Payer: Self-pay | Admitting: Family Medicine

## 2015-10-04 NOTE — Telephone Encounter (Signed)
Refill appropriate and filled per protocol. 

## 2015-10-08 ENCOUNTER — Other Ambulatory Visit: Payer: Self-pay | Admitting: Family Medicine

## 2015-10-08 NOTE — Telephone Encounter (Signed)
Refill appropriate and filled per protocol. 

## 2015-10-28 ENCOUNTER — Other Ambulatory Visit: Payer: Self-pay | Admitting: Family Medicine

## 2015-11-05 ENCOUNTER — Ambulatory Visit (INDEPENDENT_AMBULATORY_CARE_PROVIDER_SITE_OTHER): Payer: Medicare Other | Admitting: Family Medicine

## 2015-11-05 ENCOUNTER — Encounter: Payer: Self-pay | Admitting: Family Medicine

## 2015-11-05 VITALS — BP 132/70 | HR 78 | Temp 98.1°F | Resp 14 | Ht 62.0 in | Wt 271.0 lb

## 2015-11-05 DIAGNOSIS — Z8673 Personal history of transient ischemic attack (TIA), and cerebral infarction without residual deficits: Secondary | ICD-10-CM

## 2015-11-05 DIAGNOSIS — E119 Type 2 diabetes mellitus without complications: Secondary | ICD-10-CM

## 2015-11-05 DIAGNOSIS — E785 Hyperlipidemia, unspecified: Secondary | ICD-10-CM | POA: Diagnosis not present

## 2015-11-05 DIAGNOSIS — G6289 Other specified polyneuropathies: Secondary | ICD-10-CM

## 2015-11-05 DIAGNOSIS — I10 Essential (primary) hypertension: Secondary | ICD-10-CM

## 2015-11-05 LAB — CBC WITH DIFFERENTIAL/PLATELET
BASOS ABS: 0 {cells}/uL (ref 0–200)
Basophils Relative: 0 %
EOS PCT: 3 %
Eosinophils Absolute: 192 cells/uL (ref 15–500)
HCT: 38.9 % (ref 35.0–45.0)
Hemoglobin: 12.3 g/dL (ref 12.0–15.0)
LYMPHS ABS: 1152 {cells}/uL (ref 850–3900)
Lymphocytes Relative: 18 %
MCH: 29.6 pg (ref 27.0–33.0)
MCHC: 31.6 g/dL — AB (ref 32.0–36.0)
MCV: 93.7 fL (ref 80.0–100.0)
MONO ABS: 448 {cells}/uL (ref 200–950)
MONOS PCT: 7 %
MPV: 10.5 fL (ref 7.5–12.5)
NEUTROS PCT: 72 %
Neutro Abs: 4608 cells/uL (ref 1500–7800)
PLATELETS: 243 10*3/uL (ref 140–400)
RBC: 4.15 MIL/uL (ref 3.80–5.10)
RDW: 14.7 % (ref 11.0–15.0)
WBC: 6.4 10*3/uL (ref 3.8–10.8)

## 2015-11-05 LAB — COMPREHENSIVE METABOLIC PANEL
ALT: 12 U/L (ref 6–29)
AST: 13 U/L (ref 10–35)
Albumin: 3.8 g/dL (ref 3.6–5.1)
Alkaline Phosphatase: 73 U/L (ref 33–130)
BUN: 20 mg/dL (ref 7–25)
CHLORIDE: 109 mmol/L (ref 98–110)
CO2: 26 mmol/L (ref 20–31)
Calcium: 9.3 mg/dL (ref 8.6–10.4)
Creat: 1.08 mg/dL — ABNORMAL HIGH (ref 0.60–0.93)
GLUCOSE: 157 mg/dL — AB (ref 70–99)
Potassium: 4.4 mmol/L (ref 3.5–5.3)
SODIUM: 137 mmol/L (ref 135–146)
Total Bilirubin: 0.7 mg/dL (ref 0.2–1.2)
Total Protein: 6.5 g/dL (ref 6.1–8.1)

## 2015-11-05 LAB — HEMOGLOBIN A1C
Hgb A1c MFr Bld: 6.9 % — ABNORMAL HIGH (ref <5.7)
MEAN PLASMA GLUCOSE: 151 mg/dL

## 2015-11-05 NOTE — Assessment & Plan Note (Signed)
Blood pressure well controlled no change in medication 

## 2015-11-05 NOTE — Assessment & Plan Note (Signed)
Unchanged still able to ambulate with use of cane she also has underlying osteoarthritis controlled to problems with ambulation and balance

## 2015-11-05 NOTE — Assessment & Plan Note (Signed)
History of stroke continue with Plavix

## 2015-11-05 NOTE — Patient Instructions (Addendum)
F/U 4 months for Physical We will call with lab results  Work on exercise  Healthier eating

## 2015-11-05 NOTE — Progress Notes (Signed)
Patient ID: Christine Zavala, female   DOB: 1934/05/03, 82 y.o.   MRN: [id number]    Subjective:    Patient ID: Christine Zavala, female    DOB: 03-15-1934, 82 y.o.   MRN: [id number]  Patient presents for 4 month F/ Patient here to follow-up chronic medical problems. She's history of stroke diabetes mellitus hypertension hyperlipidemia. She is taking all her medications as prescribed.  Diabetes mellitus last A1c was 6.7%, cholesterol was still elevated with an LDL 131 but she does not tolerate high-dose statins therefore we are sticking with pravastatin. She has some mild chronic kidney disease Her blood sugars are around 110 fasting. She has gained 11 pounds since her last visit. Her only concern is that she falls asleep during the day she just sits in her recliner she rest well at night, feels rested in the morning  Is maintained on tramadol for her osteoarthritis  History of stroke she is on Plavix   Review Of Systems:  GEN- denies fatigue, fever, weight loss,weakness, recent illness HEENT- denies eye drainage, change in vision, nasal discharge, CVS- denies chest pain, palpitations RESP- denies SOB, cough, wheeze ABD- denies N/V, change in stools, abd pain GU- denies dysuria, hematuria, dribbling, incontinence MSK- + joint pain, muscle aches, injury Neuro- denies headache, dizziness, syncope, seizure activity       Objective:    BP 132/70 mmHg  Pulse 78  Temp(Src) 98.1 F (36.7 C) (Oral)  Resp 14  Ht 5\' 2"  (1.575 m)  Wt 271 lb (122.925 kg)  BMI 49.55 kg/m2 GEN- NAD, alert and oriented x3,obese  HEENT- PERRL, EOMI, non injected sclera, pink conjunctiva, MMM, oropharynx clear CVS- RRR, no murmur RESP-CTAB ABD-NABS,soft,NT,ND EXT- pedal edema Pulses- Radial, DP-1+        Assessment & Plan:      Problem List Items Addressed This Visit    Peripheral neuropathy (HCC)    Unchanged still able to ambulate with use of cane she also has underlying osteoarthritis controlled to  problems with ambulation and balance      Hypertension    Blood pressure well controlled no change in medication      Hyperlipidemia   History of CVA (cerebrovascular accident)    History of stroke continue with Plavix      Diabetes mellitus without complication ([place]) - Primary    Diabetes has been well controlled but I'm concerned with her weight gain in her diet. Her son is taking care of her is providing her with fast food as he does not cook. We also reiterated trying to get her moving around during the daytime I gave her some exercises to do twice a day that she can do in the home safely. Discussed at the falling asleep is normal for her age and try to keep herself more active      Relevant Orders   CBC with Differential/Platelet   Comprehensive metabolic panel   Hemoglobin A1c   HM DIABETES FOOT EXAM (Completed)      Note: This dictation was prepared with Dragon dictation along with smaller phrase technology. Any transcriptional errors that result from this process are unintentional.

## 2015-11-05 NOTE — Assessment & Plan Note (Signed)
Diabetes has been well controlled but I'm concerned with her weight gain in her diet. Her son is taking care of her is providing her with fast food as he does not cook. We also reiterated trying to get her moving around during the daytime I gave her some exercises to do twice a day that she can do in the home safely. Discussed at the falling asleep is normal for her age and try to keep herself more active

## 2015-11-06 ENCOUNTER — Ambulatory Visit: Payer: Medicare Other | Admitting: Family Medicine

## 2015-11-06 ENCOUNTER — Encounter: Payer: Self-pay | Admitting: *Deleted

## 2015-11-22 ENCOUNTER — Other Ambulatory Visit: Payer: Self-pay | Admitting: Family Medicine

## 2015-11-22 NOTE — Telephone Encounter (Signed)
Refill appropriate and filled per protocol. 

## 2015-11-28 ENCOUNTER — Ambulatory Visit
Admission: RE | Admit: 2015-11-28 | Discharge: 2015-11-28 | Disposition: A | Discharge status: Home / Self Care | Payer: Medicare Other | Source: Ambulatory Visit | Attending: Family Medicine | Admitting: Family Medicine

## 2015-11-28 ENCOUNTER — Encounter: Payer: Self-pay | Admitting: Family Medicine

## 2015-11-28 ENCOUNTER — Ambulatory Visit (INDEPENDENT_AMBULATORY_CARE_PROVIDER_SITE_OTHER): Payer: Medicare Other | Admitting: Family Medicine

## 2015-11-28 VITALS — BP 128/64 | HR 88 | Temp 98.6°F | Resp 14 | Ht 62.0 in | Wt 274.0 lb

## 2015-11-28 DIAGNOSIS — R1030 Lower abdominal pain, unspecified: Secondary | ICD-10-CM

## 2015-11-28 DIAGNOSIS — K439 Ventral hernia without obstruction or gangrene: Secondary | ICD-10-CM | POA: Diagnosis not present

## 2015-11-28 DIAGNOSIS — K5900 Constipation, unspecified: Secondary | ICD-10-CM | POA: Diagnosis not present

## 2015-11-28 DIAGNOSIS — K5901 Slow transit constipation: Secondary | ICD-10-CM

## 2015-11-28 MED ORDER — LINACLOTIDE 72 MCG PO CAPS: 72.0000 ug | ORAL_CAPSULE | Freq: Every day | ORAL | Status: DC

## 2015-11-28 NOTE — Patient Instructions (Addendum)
Go to [place]  Get xray done  F/U pending results

## 2015-11-28 NOTE — Progress Notes (Signed)
Patient ID: Christine Zavala, female   DOB: Oct 21, 1933, 82 y.o.   MRN: [id number]    Subjective:    Patient ID: Christine Zavala, female    DOB: 1934-03-28, 82 y.o.   MRN: [id number]  Patient presents for Hernia Patient with some lower abdominal pain for past week She has known ventral hernia but she states it is harder now feels like there is something stuck in it. She's been having difficulty was with her bowel she's been taking milk of magnesia some other capsules over-the-counter and still having small bowel movements. She is passing gas but also very irregular. She denies any fever no vomiting no chest pain or difficulty breathing. She would like to have her hernia fixed  Xray warranted further evaluation of hernia/bowel loops  Review Of Systems:  GEN- denies fatigue, fever, weight loss,weakness, recent illness HEENT- denies eye drainage, change in vision, nasal discharge, CVS- denies chest pain, palpitations RESP- denies SOB, cough, wheeze ABD- denies N/V, change in stools, +abd pain GU- denies dysuria, hematuria, dribbling, incontinence MSK- denies joint pain, muscle aches, injury Neuro- denies headache, dizziness, syncope, seizure activity       Objective:    BP 128/64 mmHg  Pulse 88  Temp(Src) 98.6 F (37 C) (Oral)  Resp 14  Ht 5\' 2"  (1.575 m)  Wt 274 lb (124.286 kg)  BMI 50.10 kg/m2 GEN- NAD, alert and oriented x3 CVS- RRR, no murmur RESP-CTAB ABD,-ventral hernia- hardened region, able to reduce some, hyperpigmented bowel sounds over hernia region, mild TTP EXT-peda  edema Pulses- Radial  2+        Assessment & Plan:      Problem List Items Addressed This Visit    Constipation   Relevant Orders   DG Abd 2 Views (Completed)    Other Visit Diagnoses    Lower abdominal pain    -  Primary    Xray done, no sign of obstructions, but has constipation, will obtain CT to further evaluate loops of bowel in hernia, given Linzess from office,  I think the constipation is also  contributed to the tightness and some other discomfort.     Relevant Orders    DG Abd 2 Views (Completed)    Ventral hernia, recurrence not specified           Note: This dictation was prepared with Dragon dictation along with smaller phrase technology. Any transcriptional errors that result from this process are unintentional.

## 2015-11-30 ENCOUNTER — Other Ambulatory Visit: Payer: Self-pay | Admitting: Family Medicine

## 2015-11-30 DIAGNOSIS — K5901 Slow transit constipation: Secondary | ICD-10-CM

## 2015-11-30 DIAGNOSIS — K439 Ventral hernia without obstruction or gangrene: Secondary | ICD-10-CM

## 2015-11-30 DIAGNOSIS — R1030 Lower abdominal pain, unspecified: Secondary | ICD-10-CM

## 2015-12-03 ENCOUNTER — Ambulatory Visit
Admission: RE | Admit: 2015-12-03 | Discharge: 2015-12-03 | Disposition: A | Discharge status: Home / Self Care | Payer: Medicare Other | Source: Ambulatory Visit | Attending: Family Medicine | Admitting: Family Medicine

## 2015-12-03 DIAGNOSIS — K5901 Slow transit constipation: Secondary | ICD-10-CM

## 2015-12-03 DIAGNOSIS — K439 Ventral hernia without obstruction or gangrene: Secondary | ICD-10-CM

## 2015-12-03 DIAGNOSIS — K5900 Constipation, unspecified: Secondary | ICD-10-CM | POA: Diagnosis not present

## 2015-12-03 DIAGNOSIS — R1030 Lower abdominal pain, unspecified: Secondary | ICD-10-CM

## 2015-12-03 MED ORDER — IOPAMIDOL (ISOVUE-300) INJECTION 61%
125.0000 mL | Freq: Once | INTRAVENOUS | Status: AC | PRN
  Administered 2015-12-03: 125 mL via INTRAVENOUS

## 2015-12-27 DIAGNOSIS — K432 Incisional hernia without obstruction or gangrene: Secondary | ICD-10-CM | POA: Diagnosis not present

## 2015-12-31 ENCOUNTER — Other Ambulatory Visit: Payer: Self-pay | Admitting: Family Medicine

## 2016-02-29 ENCOUNTER — Other Ambulatory Visit: Payer: Self-pay | Admitting: Family Medicine

## 2016-03-04 NOTE — Telephone Encounter (Signed)
Medication refilled per protocol. 

## 2016-03-11 ENCOUNTER — Ambulatory Visit (INDEPENDENT_AMBULATORY_CARE_PROVIDER_SITE_OTHER): Payer: Medicare Other | Admitting: Family Medicine

## 2016-03-11 ENCOUNTER — Encounter: Payer: Self-pay | Admitting: Family Medicine

## 2016-03-11 VITALS — BP 130/78 | HR 88 | Temp 97.9°F | Resp 14 | Ht 62.0 in | Wt 270.0 lb

## 2016-03-11 DIAGNOSIS — I10 Essential (primary) hypertension: Secondary | ICD-10-CM | POA: Diagnosis not present

## 2016-03-11 DIAGNOSIS — M150 Primary generalized (osteo)arthritis: Secondary | ICD-10-CM | POA: Diagnosis not present

## 2016-03-11 DIAGNOSIS — E119 Type 2 diabetes mellitus without complications: Secondary | ICD-10-CM

## 2016-03-11 DIAGNOSIS — F0150 Vascular dementia without behavioral disturbance: Secondary | ICD-10-CM

## 2016-03-11 DIAGNOSIS — Z0000 Encounter for general adult medical examination without abnormal findings: Secondary | ICD-10-CM | POA: Diagnosis not present

## 2016-03-11 DIAGNOSIS — K5900 Constipation, unspecified: Secondary | ICD-10-CM | POA: Diagnosis not present

## 2016-03-11 DIAGNOSIS — E782 Mixed hyperlipidemia: Secondary | ICD-10-CM

## 2016-03-11 DIAGNOSIS — M159 Polyosteoarthritis, unspecified: Secondary | ICD-10-CM

## 2016-03-11 DIAGNOSIS — M8949 Other hypertrophic osteoarthropathy, multiple sites: Secondary | ICD-10-CM

## 2016-03-11 LAB — LIPID PANEL
CHOLESTEROL: 230 mg/dL — AB (ref 125–200)
HDL: 39 mg/dL — ABNORMAL LOW (ref >=46)
LDL Cholesterol: 145 mg/dL — ABNORMAL HIGH (ref <130)
Total CHOL/HDL Ratio: 5.9 Ratio — ABNORMAL HIGH (ref <=5.0)
Triglycerides: 230 mg/dL — ABNORMAL HIGH (ref <150)
VLDL: 46 mg/dL — AB (ref <30)

## 2016-03-11 LAB — CBC WITH DIFFERENTIAL/PLATELET
BASOS PCT: 0 %
Basophils Absolute: 0 cells/uL (ref 0–200)
EOS PCT: 2 %
Eosinophils Absolute: 156 cells/uL (ref 15–500)
HCT: 39.2 % (ref 35.0–45.0)
Hemoglobin: 12.7 g/dL (ref 12.0–15.0)
Lymphocytes Relative: 19 %
Lymphs Abs: 1482 cells/uL (ref 850–3900)
MCH: 30.4 pg (ref 27.0–33.0)
MCHC: 32.4 g/dL (ref 32.0–36.0)
MCV: 93.8 fL (ref 80.0–100.0)
MONOS PCT: 7 %
MPV: 10.9 fL (ref 7.5–12.5)
Monocytes Absolute: 546 cells/uL (ref 200–950)
NEUTROS ABS: 5616 {cells}/uL (ref 1500–7800)
Neutrophils Relative %: 72 %
PLATELETS: 224 10*3/uL (ref 140–400)
RBC: 4.18 MIL/uL (ref 3.80–5.10)
RDW: 14.4 % (ref 11.0–15.0)
WBC: 7.8 10*3/uL (ref 3.8–10.8)

## 2016-03-11 LAB — COMPREHENSIVE METABOLIC PANEL
ALK PHOS: 71 U/L (ref 33–130)
ALT: 10 U/L (ref 6–29)
AST: 13 U/L (ref 10–35)
Albumin: 3.6 g/dL (ref 3.6–5.1)
BUN: 17 mg/dL (ref 7–25)
CHLORIDE: 106 mmol/L (ref 98–110)
CO2: 25 mmol/L (ref 20–31)
CREATININE: 1.03 mg/dL — AB (ref 0.60–0.88)
Calcium: 8.9 mg/dL (ref 8.6–10.4)
GLUCOSE: 132 mg/dL — AB (ref 70–99)
Potassium: 4.4 mmol/L (ref 3.5–5.3)
SODIUM: 140 mmol/L (ref 135–146)
TOTAL PROTEIN: 6.3 g/dL (ref 6.1–8.1)
Total Bilirubin: 0.7 mg/dL (ref 0.2–1.2)

## 2016-03-11 LAB — HEMOGLOBIN A1C
HEMOGLOBIN A1C: 6.2 % — AB (ref <5.7)
MEAN PLASMA GLUCOSE: 131 mg/dL

## 2016-03-11 MED ORDER — TRAMADOL HCL 50 MG PO TABS: ORAL_TABLET | ORAL | 4 refills | Status: DC

## 2016-03-11 MED ORDER — PRAVASTATIN SODIUM 40 MG PO TABS: 40.0000 mg | ORAL_TABLET | Freq: Every day | ORAL | 2 refills | Status: DC

## 2016-03-11 MED ORDER — CLOPIDOGREL BISULFATE 75 MG PO TABS: ORAL_TABLET | ORAL | 2 refills | Status: DC

## 2016-03-11 MED ORDER — METFORMIN HCL 500 MG PO TABS: 500.0000 mg | ORAL_TABLET | Freq: Two times a day (BID) | ORAL | 2 refills | Status: DC

## 2016-03-11 MED ORDER — LISINOPRIL 40 MG PO TABS: 20.0000 mg | ORAL_TABLET | Freq: Every day | ORAL | 2 refills | Status: DC

## 2016-03-11 MED ORDER — LINACLOTIDE 145 MCG PO CAPS: 145.0000 ug | ORAL_CAPSULE | Freq: Every day | ORAL | 6 refills | Status: DC

## 2016-03-11 NOTE — Patient Instructions (Addendum)
F/U 4 months Linzess for constipation  We will call with lab results No ALEVE Take the ultram twice a day

## 2016-03-11 NOTE — Assessment & Plan Note (Signed)
Blood pressure is well controlled medication medication 

## 2016-03-11 NOTE — Assessment & Plan Note (Signed)
Increase linzess to 145mg  daily

## 2016-03-11 NOTE — Progress Notes (Signed)
Subjective:   Patient presents for Medicare Annual/Subsequent preventive examination.    After last visit she was seen by general surgery for her hernia decision was made for just observation   Diabetes mellitus this has been fairly well-controlled her last A1c was 6.9%- she is taking metformin without any difficulties  , cholesterol has been elevated chronically she does not tolerate higher doses of pravastatin and LDL 131 total cholesterol 214 checked 4 months ago.  Eye doctor- [facility]  She has had a fall since her last visit she was stooping down to get something out of the lower drawer and she fell onto her bottom she was unable to get up until her son came to assist her. She wants to know she would be a good candidate for a life alert bracelet. She still has chronic pain in her lower back and her left hip she has known osteoarthritis. She's been taking Aleve daily. She's not been using the tramadol  Constipation she is taking Metamucil should she was given 72 mg of the Linzess but this only worked a little  Review Past Medical/Family/Social: Per EMR    Risk Factors  Current exercise habits:  None  Dietary issues discussed: Yes  Cardiac risk factors: Obesity (BMI >= 30 kg/m2). DM, HTN, Hyperlipidemia ,Stroke   Depression Screen - see PHQ 9  (Note: if answer to either of the following is "Yes", a more complete depression screening is indicated)  Over the past two weeks, have you felt down, depressed or hopeless? No Over the past two weeks, have you felt little interest or pleasure in doing things? No Have you lost interest or pleasure in daily life? No Do you often feel hopeless? No Do you cry easily over simple problems? No   Activities of Daily Living  In your present state of health, do you have any difficulty performing the following activities?:  Driving? Does not drive  Managing money?Yes Feeding yourself? No  Getting from bed to chair? No  Climbing a flight of stairs?  Yes Preparing food and eating?: No  Bathing or showering? Yes  Getting dressed:Yes  Getting to the toilet? No  Using the toilet:No  Moving around from place to place: No  In the past year have you fallen or had a near fall?Yes   Are you sexually active? No  Do you have more than one partner? No   Hearing Difficulties: No  Do you often ask people to speak up or repeat themselves?Yes  Do you experience ringing or noises in your ears? No Do you have difficulty understanding soft or whispered voices? Yes Do you feel that you have a problem with memory? Yes Do you often misplace items? No  Do you feel safe at home? Yes  Cognitive Testing  Alert? Yes Normal Appearance?Yes  Oriented to person? Yes Place? Yes  Time? Yes  Recall of three objects? Yes  Can perform simple calculations? Yes  Displays appropriate judgment?Yes  Can read the correct time from a watch face?Yes   List the Names of Other Physician/Practitioners you currently use:  [facility]  Screening Tests / Date PNA- UTD Shingles- declines  Influenza Vaccine - Declines  Tetanus/tdap UTD  ROS: GEN- denies fatigue, fever, weight loss,weakness, recent illness HEENT- denies eye drainage, change in vision, nasal discharge, CVS- denies chest pain, palpitations RESP- denies SOB, cough, wheeze ABD- denies N/V, change in stools, abd pain GU- denies dysuria, hematuria, dribbling, incontinence MSK- +joint pain, muscle aches, injury Neuro- denies headache, dizziness, syncope,  seizure activity  Physical: GEN- NAD, alert and oriented x3,obese unstable gait walks with cane  HEENT- PERRL, EOMI, non injected sclera, pink conjunctiva, MMM, oropharynx clear Neck- Supple, no thryomegaly CVS- RRR, no murmur RESP-CTAB ABD-NABS,soft,NT,ND,ventral hernia easily reduced  EXT-Ankle  Edema bilat  MSK- Decreased ROM Spine, HIPS,  KNEES  Pulses- Radial, DP- 2+    Assessment:    Annual wellness medicare exam   Plan:    During  the course of the visit the patient was educated and counseled about appropriate screening and preventive services including:    Screen Neg  for depression. 5  PHQ- 9 score - more for fatigue    Patient Instructions (the written plan) was given to the patient.  Medicare Attestation  I have personally reviewed:  The patient's medical and social history  Their use of alcohol, tobacco or illicit drugs  Their current medications and supplements  The patient's functional ability including ADLs,fall risks, home safety risks, cognitive, and hearing and visual impairment  Diet and physical activities  Evidence for depression or mood disorders  The patient's weight, height, BMI, and visual acuity have been recorded in the chart. I have made referrals, counseling, and provided education to the patient based on review of the above and I have provided the patient with a written personalized care plan for preventive services.

## 2016-03-11 NOTE — Assessment & Plan Note (Signed)
Generalized osteoarthritis including her hip. She is walking with a cane. Discussed using a life alert bracelet I think this is would be a good option for her. She's going to stop the Aleve I'm concerned about her taking too much of this with her Plavix in the bleeding that could occur. She is to use the tramadol instead

## 2016-03-11 NOTE — Assessment & Plan Note (Signed)
She is on low-dose statin but all that she can tolerate continue current dose

## 2016-03-11 NOTE — Assessment & Plan Note (Signed)
Mild cognitive decline, no medications, needed did not tolerate ARICEPT

## 2016-03-11 NOTE — Assessment & Plan Note (Signed)
Diabetes has been well controlled to change to medication she's been tolerable of the metformin as well as her renal function. She declines flu shot

## 2016-03-12 ENCOUNTER — Telehealth: Payer: Self-pay | Admitting: Family Medicine

## 2016-03-13 ENCOUNTER — Encounter: Payer: Self-pay | Admitting: *Deleted

## 2016-03-18 ENCOUNTER — Telehealth: Payer: Self-pay | Admitting: Family Medicine

## 2016-03-18 NOTE — Telephone Encounter (Signed)
Patient calling to speak to you regarding her visit last week and a medication she was prescribed please call her at [phone]

## 2016-03-18 NOTE — Telephone Encounter (Signed)
Call placed to patient. LMTRC.  

## 2016-03-19 NOTE — Telephone Encounter (Signed)
Call placed to patient. LMTRC.  

## 2016-03-20 NOTE — Telephone Encounter (Signed)
Call placed to patient.   Reports that she started taking Linzess 141mcg. States that medication is too effectie. Reports increased loose stools QD. States that she has around 4 large BM's every day while taking Linzess.   States that she stopped taking the samples d/t loose stools.   MD please advise.

## 2016-03-21 NOTE — Telephone Encounter (Signed)
Since the lower dose did NOT work well, have her take the 134mcg three times a week instead

## 2016-03-24 NOTE — Telephone Encounter (Signed)
Call placed to patient. LMTRC.  

## 2016-03-25 NOTE — Telephone Encounter (Signed)
Multiple calls placed to patient with no answer and no return call.   Message to be closed.  

## 2016-04-01 DIAGNOSIS — E119 Type 2 diabetes mellitus without complications: Secondary | ICD-10-CM | POA: Diagnosis not present

## 2016-04-01 DIAGNOSIS — M25552 Pain in left hip: Secondary | ICD-10-CM | POA: Diagnosis not present

## 2016-04-01 DIAGNOSIS — J069 Acute upper respiratory infection, unspecified: Secondary | ICD-10-CM | POA: Diagnosis not present

## 2016-04-01 DIAGNOSIS — J029 Acute pharyngitis, unspecified: Secondary | ICD-10-CM | POA: Diagnosis not present

## 2016-04-01 DIAGNOSIS — M129 Arthropathy, unspecified: Secondary | ICD-10-CM | POA: Diagnosis not present

## 2016-04-01 DIAGNOSIS — Z79899 Other long term (current) drug therapy: Secondary | ICD-10-CM | POA: Diagnosis not present

## 2016-04-09 DIAGNOSIS — M25552 Pain in left hip: Secondary | ICD-10-CM | POA: Diagnosis not present

## 2016-04-09 DIAGNOSIS — J069 Acute upper respiratory infection, unspecified: Secondary | ICD-10-CM | POA: Diagnosis not present

## 2016-04-09 DIAGNOSIS — R739 Hyperglycemia, unspecified: Secondary | ICD-10-CM | POA: Diagnosis not present

## 2016-04-23 DIAGNOSIS — J069 Acute upper respiratory infection, unspecified: Secondary | ICD-10-CM | POA: Diagnosis not present

## 2016-05-16 ENCOUNTER — Emergency Department (HOSPITAL_COMMUNITY): Payer: Medicare Other

## 2016-05-16 ENCOUNTER — Encounter (HOSPITAL_COMMUNITY): Payer: Self-pay

## 2016-05-16 ENCOUNTER — Inpatient Hospital Stay (HOSPITAL_COMMUNITY)
Admission: EM | Admit: 2016-05-16 | Discharge: 2016-05-20 | DRG: 689 | Disposition: A | Discharge status: Skilled Nursing Facility | Payer: Medicare Other | Attending: Internal Medicine | Admitting: Internal Medicine

## 2016-05-16 DIAGNOSIS — Z886 Allergy status to analgesic agent status: Secondary | ICD-10-CM | POA: Diagnosis not present

## 2016-05-16 DIAGNOSIS — Z79899 Other long term (current) drug therapy: Secondary | ICD-10-CM

## 2016-05-16 DIAGNOSIS — R600 Localized edema: Secondary | ICD-10-CM | POA: Diagnosis present

## 2016-05-16 DIAGNOSIS — R404 Transient alteration of awareness: Secondary | ICD-10-CM | POA: Diagnosis not present

## 2016-05-16 DIAGNOSIS — Z8673 Personal history of transient ischemic attack (TIA), and cerebral infarction without residual deficits: Secondary | ICD-10-CM

## 2016-05-16 DIAGNOSIS — Z85048 Personal history of other malignant neoplasm of rectum, rectosigmoid junction, and anus: Secondary | ICD-10-CM | POA: Diagnosis not present

## 2016-05-16 DIAGNOSIS — Z87891 Personal history of nicotine dependence: Secondary | ICD-10-CM | POA: Diagnosis not present

## 2016-05-16 DIAGNOSIS — Z885 Allergy status to narcotic agent status: Secondary | ICD-10-CM

## 2016-05-16 DIAGNOSIS — Z888 Allergy status to other drugs, medicaments and biological substances status: Secondary | ICD-10-CM

## 2016-05-16 DIAGNOSIS — E860 Dehydration: Secondary | ICD-10-CM | POA: Diagnosis not present

## 2016-05-16 DIAGNOSIS — Z7902 Long term (current) use of antithrombotics/antiplatelets: Secondary | ICD-10-CM

## 2016-05-16 DIAGNOSIS — N390 Urinary tract infection, site not specified: Secondary | ICD-10-CM | POA: Diagnosis not present

## 2016-05-16 DIAGNOSIS — B9620 Unspecified Escherichia coli [E. coli] as the cause of diseases classified elsewhere: Secondary | ICD-10-CM | POA: Diagnosis present

## 2016-05-16 DIAGNOSIS — F0390 Unspecified dementia without behavioral disturbance: Secondary | ICD-10-CM | POA: Diagnosis present

## 2016-05-16 DIAGNOSIS — E119 Type 2 diabetes mellitus without complications: Secondary | ICD-10-CM | POA: Diagnosis present

## 2016-05-16 DIAGNOSIS — G9340 Encephalopathy, unspecified: Secondary | ICD-10-CM | POA: Diagnosis not present

## 2016-05-16 DIAGNOSIS — E669 Obesity, unspecified: Secondary | ICD-10-CM | POA: Diagnosis present

## 2016-05-16 DIAGNOSIS — R531 Weakness: Secondary | ICD-10-CM | POA: Diagnosis not present

## 2016-05-16 DIAGNOSIS — G9341 Metabolic encephalopathy: Secondary | ICD-10-CM | POA: Diagnosis present

## 2016-05-16 DIAGNOSIS — I10 Essential (primary) hypertension: Secondary | ICD-10-CM | POA: Diagnosis present

## 2016-05-16 DIAGNOSIS — R410 Disorientation, unspecified: Secondary | ICD-10-CM | POA: Diagnosis not present

## 2016-05-16 DIAGNOSIS — E785 Hyperlipidemia, unspecified: Secondary | ICD-10-CM | POA: Diagnosis not present

## 2016-05-16 DIAGNOSIS — E1122 Type 2 diabetes mellitus with diabetic chronic kidney disease: Secondary | ICD-10-CM

## 2016-05-16 DIAGNOSIS — M6281 Muscle weakness (generalized): Secondary | ICD-10-CM | POA: Diagnosis not present

## 2016-05-16 DIAGNOSIS — R42 Dizziness and giddiness: Secondary | ICD-10-CM | POA: Diagnosis not present

## 2016-05-16 DIAGNOSIS — Z7984 Long term (current) use of oral hypoglycemic drugs: Secondary | ICD-10-CM

## 2016-05-16 DIAGNOSIS — Z6841 Body Mass Index (BMI) 40.0 and over, adult: Secondary | ICD-10-CM

## 2016-05-16 DIAGNOSIS — M5136 Other intervertebral disc degeneration, lumbar region: Secondary | ICD-10-CM | POA: Diagnosis present

## 2016-05-16 DIAGNOSIS — R197 Diarrhea, unspecified: Secondary | ICD-10-CM | POA: Diagnosis not present

## 2016-05-16 DIAGNOSIS — N179 Acute kidney failure, unspecified: Secondary | ICD-10-CM | POA: Diagnosis present

## 2016-05-16 LAB — BASIC METABOLIC PANEL
ANION GAP: 11 (ref 5–15)
BUN: 23 mg/dL — ABNORMAL HIGH (ref 6–20)
CALCIUM: 8.9 mg/dL (ref 8.9–10.3)
CHLORIDE: 105 mmol/L (ref 101–111)
CO2: 22 mmol/L (ref 22–32)
CREATININE: 1.27 mg/dL — AB (ref 0.44–1.00)
GFR calc non Af Amer: 39 mL/min — ABNORMAL LOW (ref >60)
GFR, EST AFRICAN AMERICAN: 45 mL/min — AB (ref >60)
Glucose, Bld: 178 mg/dL — ABNORMAL HIGH (ref 65–99)
Potassium: 4 mmol/L (ref 3.5–5.1)
SODIUM: 138 mmol/L (ref 135–145)

## 2016-05-16 LAB — CBC
HCT: 41.8 % (ref 36.0–46.0)
HEMOGLOBIN: 13.3 g/dL (ref 12.0–15.0)
MCH: 30.8 pg (ref 26.0–34.0)
MCHC: 31.8 g/dL (ref 30.0–36.0)
MCV: 96.8 fL (ref 78.0–100.0)
PLATELETS: 168 10*3/uL (ref 150–400)
RBC: 4.32 MIL/uL (ref 3.87–5.11)
RDW: 14 % (ref 11.5–15.5)
WBC: 9.1 10*3/uL (ref 4.0–10.5)

## 2016-05-16 LAB — URINALYSIS, ROUTINE W REFLEX MICROSCOPIC
BILIRUBIN URINE: NEGATIVE
GLUCOSE, UA: NEGATIVE mg/dL
KETONES UR: NEGATIVE mg/dL
Nitrite: POSITIVE — AB
PH: 5 (ref 5.0–8.0)
Protein, ur: NEGATIVE mg/dL
Specific Gravity, Urine: 1.019 (ref 1.005–1.030)
Squamous Epithelial / LPF: NONE SEEN

## 2016-05-16 LAB — GLUCOSE, CAPILLARY
GLUCOSE-CAPILLARY: 136 mg/dL — AB (ref 65–99)
Glucose-Capillary: 135 mg/dL — ABNORMAL HIGH (ref 65–99)

## 2016-05-16 LAB — I-STAT TROPONIN, ED: Troponin i, poc: 0 ng/mL (ref 0.00–0.08)

## 2016-05-16 LAB — CBG MONITORING, ED: Glucose-Capillary: 183 mg/dL — ABNORMAL HIGH (ref 65–99)

## 2016-05-16 LAB — VITAMIN B12: VITAMIN B 12: 188 pg/mL (ref 180–914)

## 2016-05-16 LAB — TSH: TSH: 0.819 u[IU]/mL (ref 0.350–4.500)

## 2016-05-16 MED ORDER — MELOXICAM 7.5 MG PO TABS
15.0000 mg | ORAL_TABLET | Freq: Every day | ORAL | Status: DC
  Administered 2016-05-17 – 2016-05-19 (×3): 15 mg via ORAL
  Filled 2016-05-16 (×3): qty 2
  Filled 2016-05-16 (×3): qty 1
  Filled 2016-05-16: qty 2

## 2016-05-16 MED ORDER — ENOXAPARIN SODIUM 40 MG/0.4ML ~~LOC~~ SOLN
40.0000 mg | SUBCUTANEOUS | Status: DC
  Administered 2016-05-16 – 2016-05-19 (×4): 40 mg via SUBCUTANEOUS
  Filled 2016-05-16 (×4): qty 0.4

## 2016-05-16 MED ORDER — INSULIN ASPART 100 UNIT/ML ~~LOC~~ SOLN
0.0000 [IU] | Freq: Three times a day (TID) | SUBCUTANEOUS | Status: DC
  Administered 2016-05-19 – 2016-05-20 (×3): 1 [IU] via SUBCUTANEOUS

## 2016-05-16 MED ORDER — DEXTROSE 5 % IV SOLN
1.0000 g | Freq: Once | INTRAVENOUS | Status: AC
  Administered 2016-05-16: 1 g via INTRAVENOUS
  Filled 2016-05-16: qty 10

## 2016-05-16 MED ORDER — ACETAMINOPHEN 325 MG PO TABS: 650.0000 mg | ORAL_TABLET | Freq: Four times a day (QID) | ORAL | Status: DC | PRN

## 2016-05-16 MED ORDER — ACETAMINOPHEN 650 MG RE SUPP: 650.0000 mg | Freq: Four times a day (QID) | RECTAL | Status: DC | PRN

## 2016-05-16 MED ORDER — DEXTROSE 5 % IV SOLN
1.0000 g | INTRAVENOUS | Status: DC
  Administered 2016-05-17 – 2016-05-19 (×4): 1 g via INTRAVENOUS
  Filled 2016-05-16 (×5): qty 10

## 2016-05-16 MED ORDER — ONDANSETRON HCL 4 MG/2ML IJ SOLN: 4.0000 mg | Freq: Four times a day (QID) | INTRAMUSCULAR | Status: DC | PRN

## 2016-05-16 MED ORDER — LISINOPRIL 20 MG PO TABS
20.0000 mg | ORAL_TABLET | Freq: Every day | ORAL | Status: DC
  Administered 2016-05-17 – 2016-05-20 (×4): 20 mg via ORAL
  Filled 2016-05-16 (×5): qty 1

## 2016-05-16 MED ORDER — SODIUM CHLORIDE 0.9 % IV BOLUS (SEPSIS)
500.0000 mL | Freq: Once | INTRAVENOUS | Status: AC
  Administered 2016-05-16: 500 mL via INTRAVENOUS

## 2016-05-16 MED ORDER — ONDANSETRON HCL 4 MG PO TABS: 4.0000 mg | ORAL_TABLET | Freq: Four times a day (QID) | ORAL | Status: DC | PRN

## 2016-05-16 MED ORDER — CLOPIDOGREL BISULFATE 75 MG PO TABS
75.0000 mg | ORAL_TABLET | Freq: Every day | ORAL | Status: DC
  Administered 2016-05-17 – 2016-05-20 (×4): 75 mg via ORAL
  Filled 2016-05-16 (×5): qty 1

## 2016-05-16 MED ORDER — PRAVASTATIN SODIUM 40 MG PO TABS
40.0000 mg | ORAL_TABLET | Freq: Every day | ORAL | Status: DC
  Administered 2016-05-17 – 2016-05-20 (×4): 40 mg via ORAL
  Filled 2016-05-16 (×5): qty 1

## 2016-05-16 MED ORDER — INSULIN ASPART 100 UNIT/ML ~~LOC~~ SOLN: 0.0000 [IU] | Freq: Every day | SUBCUTANEOUS | Status: DC

## 2016-05-16 MED ORDER — SODIUM CHLORIDE 0.9 % IV SOLN
INTRAVENOUS | Status: AC
  Administered 2016-05-16: 18:00:00 via INTRAVENOUS

## 2016-05-16 MED ORDER — SENNOSIDES-DOCUSATE SODIUM 8.6-50 MG PO TABS: 1.0000 | ORAL_TABLET | Freq: Every evening | ORAL | Status: DC | PRN

## 2016-05-16 NOTE — ED Notes (Signed)
PT placed on bedpan

## 2016-05-16 NOTE — ED Notes (Signed)
Patient transported to MRI 

## 2016-05-16 NOTE — ED Notes (Signed)
Patient transported to CT 

## 2016-05-16 NOTE — Progress Notes (Signed)
Pt admitted to the unit at 1730. Pt mental status is A&O. Pt oriented to room, staff, and call bell. Skin is intact except where otherwise charted. Full assessment charted in [facility]. Call bell within reach. Visitor guidelines reviewed w/ pt and/or family.

## 2016-05-16 NOTE — ED Triage Notes (Signed)
Per PT, Pt is coming from home with complaints of weakness this morning that started about 0100 when she was trying to get out of bed. Pt lowered to the floor and EMS had to help her back up. Pt Came in this morning after resting with continued weakness, confusion, and thirst.

## 2016-05-16 NOTE — ED Notes (Signed)
Tatyana PA at bedside   

## 2016-05-16 NOTE — ED Notes (Signed)
The pts daughters states that the for the last "8 to 10 months she has been getting more confused, but over this last month is has gotten worse".   When asked what month it is the pt stated "ho ho ho" when asked again the pt stated "[facility]" when asked a third time the pt stated "I don't know", after the fourth time asking what the current month is the pt stated "December". When this RN was asking the pt about how she got onto the floor last night the pt had difficulty explaining she said "my get up just came out". When this RN asked for clarification the pt just repeated the statement.

## 2016-05-16 NOTE — ED Notes (Signed)
This RN, Shanon Brow NT, and Lahoma Rocker PA attempted to ambulate the pt. The pt had to have help standing up from the bed. The pt walked from the bed to the door of her room. The pt was shuffling the whole time she was walking and would not take proper steps.

## 2016-05-16 NOTE — H&P (Signed)
History and Physical    Christine Zavala [id number] DOB: Jul 14, 1933 DOA: 05/16/2016  PCP: Vic Blackbird, MD Patient coming from: home  Chief Complaint: generalized  HPI: Christine Zavala is a 82 y.o. female with medical history significant units of disc disease diabetes obesity, CVA hyperlipidemia does emergency Department chief complaint of generalized weakness. Initial workup reveals a tract infection  Information is obtained from the patient and the family is at the bedside. Patient was in her usual state of health until last night she attempted to get out of bed and just "slid to the floor. She states that she could not stand as her "legs would not work. She reports increased lower extremity edema. She reports bilateral pain from hip to toe. She had symptoms include decreased oral intake dysuria. She denies headache dizziness syncope or near-syncope. She denies chest pain palpitations shortness of breath orthopnea. She denies fever chills abdominal pain nausea vomiting diarrhea. Family also indicates patient is having intermittent brief episodes of confusion. Reports of slurred speech focal deficits    ED Course: In the emergency department she's afebrile hemodynamically stable and not hypoxic. Provided with IV fluids and Rocephin  Review of Systems: As per HPI otherwise 10 point review of systems negative.   Ambulatory Status: She usually ambulates at home with a cane. Attempted ambulate while in the emergency department patient was two-man assistance would not bear weight.  Past Medical History:  Diagnosis Date  . Cancer (HCC)    Rectal  . Cataract   . Dementia   . Diabetes mellitus without complication ([place])   . DVT (deep venous thrombosis) ([place])   . H/O vitamin D deficiency   . High cholesterol   . Hyperlipidemia   . Hypertension   . Osteoporosis   . Stroke [facility])     Past Surgical History:  Procedure Laterality Date  . CATARACT EXTRACTION, BILATERAL     2001  . COLON  SURGERY     2004  . GALLBLADDER SURGERY     2001  . HERNIA REPAIR     2004  . ovary removed     2002    Social History   Social History  . Marital status: Widowed    Spouse name: N/A  . Number of children: 2  . Years of education: 12   Occupational History  . retired Retired   Social History Main Topics  . Smoking status: Former Research scientist (life sciences)  . Smokeless tobacco: Never Used     Comment: quit 1970's  . Alcohol use No  . Drug use: No  . Sexual activity: Not on file   Other Topics Concern  . Not on file   Social History Narrative   Patient lives at home with her Son Sheliah Plane). Patient is a widowed. Patient is retired. Right handed.   Caffeine - one cup daily    Allergies  Allergen Reactions  . Aspirin Nausea And Vomiting  . Calcium-Containing Compounds     Legs burn  . Codeine Sulfate Nausea And Vomiting  . Lipitor [Atorvastatin]     myalgias  . Zocor [Simvastatin]     pruritis  . Zyrtec [Cetirizine]     Family History  Problem Relation Age of Onset  . Cancer Mother   . COPD Mother   . Dementia Father     Prior to Admission medications   Medication Sig Start Date End Date Taking? Authorizing Provider  Blood Glucose Monitoring Suppl (BLOOD GLUCOSE MONITOR KIT) KIT Pt needs glucose monitor, strips (#  100) and lancets (1 box). With prn refills.Marland KitchenMarland KitchenShe checks BS bid and DX: 250.00 10/22/12  Yes Susy Frizzle, MD  clopidogrel (PLAVIX) 75 MG tablet TAKE 1 TABLET (75 MG TOTAL) BY MOUTH DAILY. 03/11/16  Yes Alycia Rossetti, MD  lisinopril (PRINIVIL,ZESTRIL) 40 MG tablet Take 0.5 tablets (20 mg total) by mouth daily. 03/11/16  Yes Alycia Rossetti, MD  meloxicam (MOBIC) 15 MG tablet Take 15 mg by mouth daily.   Yes Historical Provider, MD  metFORMIN (GLUCOPHAGE) 500 MG tablet Take 1 tablet (500 mg total) by mouth 2 (two) times daily. 03/11/16  Yes Alycia Rossetti, MD  ONE TOUCH ULTRA TEST test strip  03/29/15  Yes Historical Provider, MD  pravastatin (PRAVACHOL)  40 MG tablet Take 1 tablet (40 mg total) by mouth daily. 03/11/16  Yes Alycia Rossetti, MD    Physical Exam: Vitals:   05/16/16 1630 05/16/16 1728 05/16/16 2116 05/17/16 0521  BP: (!) 110/54 (!) 126/59 131/60 (!) 114/58  Pulse: 91 77 82 73  Resp: '24 17 20 20  ' Temp:  98.4 F (36.9 C) 99.2 F (37.3 C) 98.6 F (37 C)  TempSrc:  Oral Oral Oral  SpO2: 95% 99% 97% 98%  Weight:  124.7 kg (275 lb)    Height:  '5\' 4"'  (1.626 m)       General:  Appears calm and comfortable, obese  Eyes:  PERRL, EOMI, normal lids, iris ENT:  grossly normal hearing, lips & tongue, because membranes of her mouth are moist and pink  Neck:  no LAD, masses or thyromegaly Cardiovascular:  RRR, no m/r/g. lower extremity edema bilaterally  Respiratory:  Normal effort breath sounds slightly distant but clear no rhonchi Abdomen:  soft, obese positive bowel sounds no guarding  Skin:  no rash or induration seen on limited exam Musculoskeletal:  grossly normal tone BUE/BLE, good ROM, no bony abnormality. Lateral lower extremities without swelling erythema Psychiatric:  grossly normal mood and affect, speech fluent and appropriate, AOx3 Neurologic:  CN 2-12 grossly intact, moves all extremities in coordinated fashion, sensation intact grip 5 out of 5 bilateral lower extremity strength 5 out of 5 no pronator drift alert and oriented 3   Labs on Admission: I have personally reviewed following labs and imaging studies  CBC:  Recent Labs Lab 05/16/16 1011  WBC 9.1  HGB 13.3  HCT 41.8  MCV 96.8  PLT 944   Basic Metabolic Panel:  Recent Labs Lab 05/16/16 1011  NA 138  K 4.0  CL 105  CO2 22  GLUCOSE 178*  BUN 23*  CREATININE 1.27*  CALCIUM 8.9   GFR: Estimated Creatinine Clearance: 46.1 mL/min (by C-G formula based on SCr of 1.27 mg/dL (H)). Liver Function Tests: No results for input(s): AST, ALT, ALKPHOS, BILITOT, PROT, ALBUMIN in the last 168 hours. No results for input(s): LIPASE, AMYLASE in the  last 168 hours. No results for input(s): AMMONIA in the last 168 hours. Coagulation Profile: No results for input(s): INR, PROTIME in the last 168 hours. Cardiac Enzymes: No results for input(s): CKTOTAL, CKMB, CKMBINDEX, TROPONINI in the last 168 hours. BNP (last 3 results) No results for input(s): PROBNP in the last 8760 hours. HbA1C: No results for input(s): HGBA1C in the last 72 hours. CBG:  Recent Labs Lab 05/16/16 1036 05/16/16 1827 05/16/16 2133  GLUCAP 183* 136* 135*   Lipid Profile: No results for input(s): CHOL, HDL, LDLCALC, TRIG, CHOLHDL, LDLDIRECT in the last 72 hours. Thyroid Function Tests:  Recent Labs  05/16/16 1811  TSH 0.819   Anemia Panel:  Recent Labs  05/16/16 1811  VITAMINB12 188   Urine analysis:    Component Value Date/Time   COLORURINE AMBER (A) 05/16/2016 1312   APPEARANCEUR CLOUDY (A) 05/16/2016 1312   LABSPEC 1.019 05/16/2016 1312   PHURINE 5.0 05/16/2016 1312   GLUCOSEU NEGATIVE 05/16/2016 1312   HGBUR MODERATE (A) 05/16/2016 1312   BILIRUBINUR NEGATIVE 05/16/2016 1312   KETONESUR NEGATIVE 05/16/2016 1312   PROTEINUR NEGATIVE 05/16/2016 1312   UROBILINOGEN 0.2 03/06/2014 0936   NITRITE POSITIVE (A) 05/16/2016 1312   LEUKOCYTESUR LARGE (A) 05/16/2016 1312    Creatinine Clearance: Estimated Creatinine Clearance: 46.1 mL/min (by C-G formula based on SCr of 1.27 mg/dL (H)).  Sepsis Labs: '@LABRCNTIP' (procalcitonin:4,lacticidven:4) )No results found for this or any previous visit (from the past 240 hour(s)).   Radiological Exams on Admission: Ct Head Wo Contrast  Result Date: 05/16/2016 CLINICAL DATA:  Confusion weakness this morning. EXAM: CT HEAD WITHOUT CONTRAST TECHNIQUE: Contiguous axial images were obtained from the base of the skull through the vertex without intravenous contrast. COMPARISON:  MR brain 03/28/2015. FINDINGS: Brain: No evidence of an acute infarct, acute hemorrhage, mass lesion, mass effect or hydrocephalus.  Atrophy. Patchy periventricular low attenuation. Vascular: No hyperdense vessel or unexpected calcification. Skull: Normal. Negative for fracture or focal lesion. Sinuses/Orbits: No acute finding. Other: None. IMPRESSION: 1. No acute intracranial abnormality. 2. Atrophy and chronic microvascular white matter ischemic changes. Electronically Signed   By: Lorin Picket M.D.   On: 05/16/2016 11:10   Mr Brain Wo Contrast  Result Date: 05/16/2016 CLINICAL DATA:  Leg weakness, difficulty with memory. Unable to walk. EXAM: MRI HEAD WITHOUT CONTRAST TECHNIQUE: Multiplanar, multiecho pulse sequences of the brain and surrounding structures were obtained without intravenous contrast. COMPARISON:  04/17/2015 FINDINGS: Brain: No acute infarction, hemorrhage, hydrocephalus, extra-axial collection or mass lesion. Chronic microvascular disease with ischemic gliosis confluent around the lateral ventricles. Remote perforator infarct affecting the of left upper internal capsule and corona radiata. No notable atrophy for age. No chronic blood products. Vascular: Normal flow voids. Skull and upper cervical spine: Negative Sinuses/Orbits: Bilateral cataract resection.  No acute finding. IMPRESSION: 1. No acute finding or change from 2016. 2. Moderate chronic small vessel disease. Electronically Signed   By: Monte Fantasia M.D.   On: 05/16/2016 14:51    EKG: Independently reviewed. Sinus rhythm Right bundle branch block Left ventricular hypertrophy   Assessment/Plan Principal Problem:   Generalized weakness Active Problems:   Hypertension   Diabetes mellitus without complication (HCC)   Hyperlipidemia   Dementia   DDD (degenerative disc disease), lumbar   UTI (urinary tract infection)   Acute encephalopathy   Acute kidney injury ([place])   1. Generalized weakness. Likely related to urinary tract infection. CT of the head unremarkable. MRI of the brain without acute changes. Urine consistent with urinary tract  infection. Max temperature 99.2. Patient is hemodynamically stable and nontoxic appearing. No leukocytosis. She was ambulated in the emergency department and nursing note indicates she "shuffled". -Admit for 24-hour observation -Rocephin -Follow urine culture -Gentle IV fluids -Physical therapy  #2. UTI. See #1 -Monitor urine output  #3. Acute encephalopathy. Family reports brief intermittent episodes of worsening confusion in the setting of mild dementia. Likely related to above. CT of the head and MRI of the brain without acute abnormalities. She is afebrile nontoxic appearing. No metabolic derangements. He is not hypoxic -Gentle IV fluids -Treatment for UTI -We'll obtain a B-12 folate -Monitor  #  4. Hypertension. Fair control. Home medications include lisinopril -Continue lisinopril -Monitor  #5. Diabetes. Serum glucose 178. Home medications include metformin. -Hold metformin for now her appetite is unreliable -Obtain a hemoglobin A1c -Use sliding scale insulin for optimal control  #6. Acute kidney injury. Mild. Creatinine 1.2 on admission. Baseline appears to be 1.1 -Gentle IV fluids -Monitor urine output -Recheck in the morning  #7. Dementia. Appears to be stable at baseline on admission. See #1 -Continue home medications      DVT prophylaxis: on plavix for hx dvt Code Status: full  Family Communication: daughter and son at bedside  Disposition Plan: may need HH or rehab  Consults called: none  Admission status: obs    Radene Gunning MD [facility]  If 7PM-7AM, please contact night-coverage [email or web address] Password [facility]  05/17/2016, 6:46 AM

## 2016-05-16 NOTE — ED Provider Notes (Signed)
[facility] DEPT Provider Note   CSN: [id number] Arrival date & time: 05/16/16  1000     History   Chief Complaint Chief Complaint  Patient presents with  . Weakness    HPI Christine Zavala is a 82 y.o. female.  HPI Christine Zavala is a 82 y.o. femalewith hx of DVT, CVA, HTN, DM, hx of rectal ca in remission, presents to ED with complaint of weakness. According to daughter, pt with gradual progressive weakness in bilateral legs that has worsened in the last week. Also with increased confusion. Yesterday, pt states she could not get into her bed, and "slid off" and layed on the floor for 2 hrs until her son found her. Son was unable to help her off the floor so EMS was called. EMS helped pt get into bed. This morning, pt states she could not get out of the bed. This time EMS brought her to ED for evaluation. Pt denies any symptoms, other than chronic pain in left hip.   Past Medical History:  Diagnosis Date  . Cancer (HCC)    Rectal  . Cataract   . Dementia   . Diabetes mellitus without complication ([place])   . DVT (deep venous thrombosis) ([place])   . H/O vitamin D deficiency   . High cholesterol   . Hyperlipidemia   . Hypertension   . Osteoporosis   . Stroke [facility])     Patient Active Problem List   Diagnosis Date Noted  . Vertigo 06/27/2015  . Abnormality of gait 04/12/2015  . Constipation 08/21/2014  . Itchy scalp 08/21/2014  . Osteoarthritis 03/06/2014  . Peripheral neuropathy ([place]) 03/06/2014  . Varicose veins 03/06/2014  . DDD (degenerative disc disease), lumbar 11/29/2013  . Renal cyst 11/29/2013  . OAB (overactive bladder) 08/24/2013  . Osteoporosis, unspecified   . Peripheral edema 11/15/2012  . History of rectal cancer   . Hyperlipidemia   . Dementia   . History of CVA (cerebrovascular accident)   . Hypertension   . Diabetes mellitus without complication [facility])     Past Surgical History:  Procedure Laterality Date  . CATARACT EXTRACTION, BILATERAL     2001  .  COLON SURGERY     2004  . GALLBLADDER SURGERY     2001  . HERNIA REPAIR     2004  . ovary removed     2002    OB History    No data available       Home Medications    Prior to Admission medications   Medication Sig Start Date End Date Taking? Authorizing Provider  Blood Glucose Monitoring Suppl (BLOOD GLUCOSE MONITOR KIT) KIT Pt needs glucose monitor, strips (#100) and lancets (1 box). With prn refills.Marland KitchenMarland KitchenShe checks BS bid and DX: 250.00 10/22/12   Susy Frizzle, MD  clopidogrel (PLAVIX) 75 MG tablet TAKE 1 TABLET (75 MG TOTAL) BY MOUTH DAILY. 03/11/16   Alycia Rossetti, MD  linaclotide [facility]) 145 MCG CAPS capsule Take 1 capsule (145 mcg total) by mouth daily before breakfast. For constipation 03/11/16   Alycia Rossetti, MD  lisinopril (PRINIVIL,ZESTRIL) 40 MG tablet Take 0.5 tablets (20 mg total) by mouth daily. 03/11/16   Alycia Rossetti, MD  meclizine (ANTIVERT) 25 MG tablet TAKE 1 TABLET BY MOUTH 3 TIMES A DAY AS NEEDED FOR DIZZINESS 10/08/15   Alycia Rossetti, MD  metFORMIN (GLUCOPHAGE) 500 MG tablet Take 1 tablet (500 mg total) by mouth 2 (two) times daily. 03/11/16   Modena Nunnery  Marathon, MD  Multiple Vitamins-Minerals (CENTRUM SILVER ADULT 50+ PO) Take 1 tablet by mouth daily.    Historical Provider, MD  ONE TOUCH ULTRA TEST test strip  03/29/15   Historical Provider, MD  pravastatin (PRAVACHOL) 40 MG tablet Take 1 tablet (40 mg total) by mouth daily. 03/11/16   Alycia Rossetti, MD  traMADol (ULTRAM) 50 MG tablet TAKE 1 TABLET TWICE A DAY AS NEEDED FOR PAIN..TO LAST 30 DAYS 03/11/16   Alycia Rossetti, MD    Family History Family History  Problem Relation Age of Onset  . Cancer Mother   . COPD Mother   . Dementia Father     Social History Social History  Substance Use Topics  . Smoking status: Former Research scientist (life sciences)  . Smokeless tobacco: Never Used     Comment: quit 1970's  . Alcohol use No     Allergies   Aspirin; Calcium-containing compounds; Codeine sulfate;  Lipitor [atorvastatin]; Zocor [simvastatin]; and Zyrtec [cetirizine]   Review of Systems Review of Systems  Constitutional: Positive for fatigue. Negative for chills and fever.  Respiratory: Negative for cough, chest tightness and shortness of breath.   Cardiovascular: Negative for chest pain, palpitations and leg swelling.  Gastrointestinal: Negative for abdominal pain, diarrhea, nausea and vomiting.  Genitourinary: Negative for dysuria, flank pain, pelvic pain, vaginal bleeding, vaginal discharge and vaginal pain.  Musculoskeletal: Positive for arthralgias and myalgias. Negative for neck pain and neck stiffness.  Skin: Negative for rash.  Neurological: Positive for weakness. Negative for dizziness and headaches.  All other systems reviewed and are negative.    Physical Exam Updated Vital Signs BP 136/57   Pulse 96   Temp 98.2 F (36.8 C) (Oral)   Resp 22   Ht 5' 2" (1.575 m)   Wt 122.5 kg   SpO2 96%   BMI 49.38 kg/m   Physical Exam  Constitutional: She is oriented to person, place, and time. She appears well-developed and well-nourished. No distress.  HENT:  Head: Normocephalic.  Eyes: Conjunctivae are normal.  Neck: Neck supple.  Cardiovascular: Normal rate, regular rhythm and normal heart sounds.   Pulmonary/Chest: Effort normal and breath sounds normal. No respiratory distress. She has no wheezes. She has no rales.  Abdominal: Soft. Bowel sounds are normal. She exhibits no distension. There is no tenderness. There is no rebound.  Musculoskeletal: She exhibits no edema.  Neurological: She is alert and oriented to person, place, and time.  5/5 and equal upper extremity strength bilaterally. Equal grip strength bilaterally. Normal finger to nose. No pronator drift. Right leg weakness compared to left.    Skin: Skin is warm and dry.  Psychiatric: She has a normal mood and affect. Her behavior is normal.  Nursing note and vitals reviewed.    ED Treatments / Results    Labs (all labs ordered are listed, but only abnormal results are displayed) Labs Reviewed  BASIC METABOLIC PANEL - Abnormal; Notable for the following:       Result Value   Glucose, Bld 178 (*)    BUN 23 (*)    Creatinine, Ser 1.27 (*)    GFR calc non Af Amer 39 (*)    GFR calc Af Amer 45 (*)    All other components within normal limits  CBG MONITORING, ED - Abnormal; Notable for the following:    Glucose-Capillary 183 (*)    All other components within normal limits  CBC  URINALYSIS, ROUTINE W REFLEX MICROSCOPIC  I-STAT TROPOININ, ED  EKG  EKG Interpretation  Date/Time:  Friday May 16 2016 10:06:44 EST Ventricular Rate:  90 PR Interval:    QRS Duration: 132 QT Interval:  388 QTC Calculation: 475 R Axis:   -21 Text Interpretation:  Sinus rhythm Right bundle branch block Left ventricular hypertrophy Baseline wander in lead(s) V3 agree. RBBB from incomplete on previous to complete. no STEMI. Confirmed by Johnney Killian, MD, Jeannie Done [phone]) on 05/16/2016 10:24:06 AM Also confirmed by Johnney Killian, MD, Jeannie Done [phone]), editor [place]  CCT, [place] ([id number])  on 05/16/2016 10:43:18 AM       Radiology Ct Head Wo Contrast  Result Date: 05/16/2016 CLINICAL DATA:  Confusion weakness this morning. EXAM: CT HEAD WITHOUT CONTRAST TECHNIQUE: Contiguous axial images were obtained from the base of the skull through the vertex without intravenous contrast. COMPARISON:  MR brain 03/28/2015. FINDINGS: Brain: No evidence of an acute infarct, acute hemorrhage, mass lesion, mass effect or hydrocephalus. Atrophy. Patchy periventricular low attenuation. Vascular: No hyperdense vessel or unexpected calcification. Skull: Normal. Negative for fracture or focal lesion. Sinuses/Orbits: No acute finding. Other: None. IMPRESSION: 1. No acute intracranial abnormality. 2. Atrophy and chronic microvascular white matter ischemic changes. Electronically Signed   By: Lorin Picket M.D.   On: 05/16/2016 11:10   Mr  Brain Wo Contrast  Result Date: 05/16/2016 CLINICAL DATA:  Leg weakness, difficulty with memory. Unable to walk. EXAM: MRI HEAD WITHOUT CONTRAST TECHNIQUE: Multiplanar, multiecho pulse sequences of the brain and surrounding structures were obtained without intravenous contrast. COMPARISON:  04/17/2015 FINDINGS: Brain: No acute infarction, hemorrhage, hydrocephalus, extra-axial collection or mass lesion. Chronic microvascular disease with ischemic gliosis confluent around the lateral ventricles. Remote perforator infarct affecting the of left upper internal capsule and corona radiata. No notable atrophy for age. No chronic blood products. Vascular: Normal flow voids. Skull and upper cervical spine: Negative Sinuses/Orbits: Bilateral cataract resection.  No acute finding. IMPRESSION: 1. No acute finding or change from 2016. 2. Moderate chronic small vessel disease. Electronically Signed   By: Monte Fantasia M.D.   On: 05/16/2016 14:51    Procedures Procedures (including critical care time)  Medications Ordered in ED Medications  cefTRIAXone (ROCEPHIN) 1 g in dextrose 5 % 50 mL IVPB (1 g Intravenous New Bag/Given 05/16/16 1544)  sodium chloride 0.9 % bolus 500 mL (0 mLs Intravenous Stopped 05/16/16 1210)     Initial Impression / Assessment and Plan / ED Course  I have reviewed the triage vital signs and the nursing notes.  Pertinent labs & imaging results that were available during my care of the patient were reviewed by me and considered in my medical decision making (see chart for details).  Clinical Course    Pt with generalized weakness, unable to ambulate, worsening mental status. Will check labs, UTI, get CT head. Attempted to ambulate. Pt very weak, almost collapsed first time tried to stand up. 2nd time, ambulated 3 steps with 3 people helping. Daughter states pt is confused more than normal, at present AAOx3.  Since labs are unremarkable. Her CT head is negative. Discussed with Dr.  Vallery Ridge, who has seen patient as well. Daughter is concerned about possible CVA. Will get MRI brain. Urinalysis still pending as well.  MR brain is negative. Urinalysis showing infection. Urine culture sent. We'll treat with Rocephin in emergency department. Possible worsening mental status and weakness  Due to UTI. Will admit for treatment. Will need PT/OT assessment.   Spoke with [facility], will admit.   Vitals:   05/16/16 1230 05/16/16 1245 05/16/16  1300 05/16/16 1545  BP: 134/59 131/59 134/61 117/57  Pulse: 86 81 90 77  Resp: _0 Temp:      TempSrc:      SpO2: 96% 96% 94% 95%  Weight:      Height:         Final Clinical Impressions(s) / ED Diagnoses   Final diagnoses:  Urinary tract infection without hematuria, site unspecified  Weakness    New Prescriptions New Prescriptions   No medications on file     Jeannett Senior, PA-C 05/16/16 [place], MD 05/17/16 [phone]

## 2016-05-17 ENCOUNTER — Encounter (HOSPITAL_COMMUNITY): Payer: Self-pay | Admitting: Internal Medicine

## 2016-05-17 DIAGNOSIS — F0150 Vascular dementia without behavioral disturbance: Secondary | ICD-10-CM

## 2016-05-17 DIAGNOSIS — N179 Acute kidney failure, unspecified: Secondary | ICD-10-CM | POA: Diagnosis present

## 2016-05-17 DIAGNOSIS — E782 Mixed hyperlipidemia: Secondary | ICD-10-CM

## 2016-05-17 DIAGNOSIS — N3001 Acute cystitis with hematuria: Secondary | ICD-10-CM

## 2016-05-17 DIAGNOSIS — R531 Weakness: Secondary | ICD-10-CM

## 2016-05-17 DIAGNOSIS — G9340 Encephalopathy, unspecified: Secondary | ICD-10-CM | POA: Diagnosis not present

## 2016-05-17 DIAGNOSIS — M5136 Other intervertebral disc degeneration, lumbar region: Secondary | ICD-10-CM | POA: Diagnosis not present

## 2016-05-17 DIAGNOSIS — I10 Essential (primary) hypertension: Secondary | ICD-10-CM

## 2016-05-17 LAB — BASIC METABOLIC PANEL
ANION GAP: 6 (ref 5–15)
BUN: 19 mg/dL (ref 6–20)
CALCIUM: 8.2 mg/dL — AB (ref 8.9–10.3)
CO2: 26 mmol/L (ref 22–32)
CREATININE: 1.26 mg/dL — AB (ref 0.44–1.00)
Chloride: 107 mmol/L (ref 101–111)
GFR calc Af Amer: 45 mL/min — ABNORMAL LOW (ref >60)
GFR, EST NON AFRICAN AMERICAN: 39 mL/min — AB (ref >60)
GLUCOSE: 117 mg/dL — AB (ref 65–99)
Potassium: 3.5 mmol/L (ref 3.5–5.1)
Sodium: 139 mmol/L (ref 135–145)

## 2016-05-17 LAB — CBC
HCT: 35.5 % — ABNORMAL LOW (ref 36.0–46.0)
Hemoglobin: 11.1 g/dL — ABNORMAL LOW (ref 12.0–15.0)
MCH: 29.8 pg (ref 26.0–34.0)
MCHC: 31.3 g/dL (ref 30.0–36.0)
MCV: 95.4 fL (ref 78.0–100.0)
PLATELETS: 157 10*3/uL (ref 150–400)
RBC: 3.72 MIL/uL — ABNORMAL LOW (ref 3.87–5.11)
RDW: 14.1 % (ref 11.5–15.5)
WBC: 6.2 10*3/uL (ref 4.0–10.5)

## 2016-05-17 LAB — GLUCOSE, CAPILLARY
GLUCOSE-CAPILLARY: 119 mg/dL — AB (ref 65–99)
GLUCOSE-CAPILLARY: 118 mg/dL — AB (ref 65–99)
Glucose-Capillary: 122 mg/dL — ABNORMAL HIGH (ref 65–99)
Glucose-Capillary: 108 mg/dL — ABNORMAL HIGH (ref 65–99)

## 2016-05-17 MED ORDER — POTASSIUM CHLORIDE CRYS ER 20 MEQ PO TBCR
40.0000 meq | EXTENDED_RELEASE_TABLET | Freq: Once | ORAL | Status: AC
  Administered 2016-05-17: 40 meq via ORAL
  Filled 2016-05-17: qty 2

## 2016-05-17 MED ORDER — SODIUM CHLORIDE 0.9 % IV SOLN
INTRAVENOUS | Status: DC
  Administered 2016-05-17: 15:00:00 via INTRAVENOUS

## 2016-05-17 NOTE — Progress Notes (Signed)
PROGRESS NOTE  Christine Zavala  AO:[id number] DOB: January 10, 1934 DOA: 05/16/2016 PCP: Vic Blackbird, MD Outpatient Specialists:  Subjective: Reported that she is feeling better than yesterday, she was seen while she was eating her breakfast.  Brief Narrative:  Christine Zavala is a 82 y.o. female with medical history significant units of disc disease diabetes obesity, CVA hyperlipidemia does emergency Department chief complaint of generalized weakness. Initial workup reveals a tract infection  Information is obtained from the patient and the family is at the bedside. Patient was in her usual state of health until last night she attempted to get out of bed and just "slid to the floor. She states that she could not stand as her "legs would not work. She reports increased lower extremity edema. She reports bilateral pain from hip to toe. She had symptoms include decreased oral intake dysuria. She denies headache dizziness syncope or near-syncope. She denies chest pain palpitations shortness of breath orthopnea. She denies fever chills abdominal pain nausea vomiting diarrhea. Family also indicates patient is having intermittent brief episodes of confusion. Reports of slurred speech focal deficits  Assessment & Plan:   Principal Problem:   Generalized weakness Active Problems:   Hypertension   Diabetes mellitus without complication (HCC)   Hyperlipidemia   Dementia   DDD (degenerative disc disease), lumbar   UTI (urinary tract infection)   Acute encephalopathy   Acute kidney injury ([place])   Generalized weakness -Likely related to urinary tract infection. CT of the head unremarkable. MRI of the brain without acute changes.  -This is likely secondary to UTI and dehydration. -Gentle IV fluid hydration, treat UTI and obtain physical therapy evaluation.  UTI -Urinalysis showed UTI strongly with TNTC WBCs. -Started on Rocephin, continue, adjust antibiotics according to culture results.  Acute  encephalopathy -Patient has mild dementia that she developed intermittent episodes of worsening confusion. -Acute metabolic encephalopathy likely secondary to UTI.  -This is improving.  Hypertension. Fair control. Home medications include lisinopril -Continue lisinopril -Monitor  Diabetes. Serum glucose 178. Home medications include metformin. -Hold metformin for now her appetite is unreliable -Obtain a hemoglobin A1c -Use sliding scale insulin for optimal control  Acute kidney injury. Mild. Creatinine 1.2 on admission. Baseline appears to be 1.1 -Not sure if this is chronic kidney disease or acute kidney injury, continue gentle IV fluid hydration, check BMP in a.m.  Dementia. Appears to be stable at baseline on admission. See #1 -Continue home medications   DVT prophylaxis:  Code Status: Full Code Family Communication:  Disposition Plan:  Diet: Diet heart healthy/carb modified Room service appropriate? Yes; Fluid consistency: Thin  Consultants:   None  Procedures:   None  Antimicrobials:   None   Objective: Vitals:   05/16/16 1630 05/16/16 1728 05/16/16 2116 05/17/16 0521  BP: (!) 110/54 (!) 126/59 131/60 (!) 114/58  Pulse: 91 77 82 73  Resp: 24 17 20 20   Temp:  98.4 F (36.9 C) 99.2 F (37.3 C) 98.6 F (37 C)  TempSrc:  Oral Oral Oral  SpO2: 95% 99% 97% 98%  Weight:  124.7 kg (275 lb)    Height:  5\' 4"  (1.626 m)      Intake/Output Summary (Last 24 hours) at 05/17/16 1306 Last data filed at 05/17/16 0013  Gross per 24 hour  Intake              200 ml  Output                0 ml  Net  200 ml   Filed Weights   05/16/16 1009 05/16/16 1728  Weight: 122.5 kg (270 lb) 124.7 kg (275 lb)    Examination: General exam: Appears calm and comfortable  Respiratory system: Clear to auscultation. Respiratory effort normal. Cardiovascular system: S1 & S2 heard, RRR. No JVD, murmurs, rubs, gallops or clicks. No pedal edema. Gastrointestinal  system: Abdomen is nondistended, soft and nontender. No organomegaly or masses felt. Normal bowel sounds heard. Central nervous system: Alert and oriented. No focal neurological deficits. Extremities: Symmetric 5 x 5 power. Skin: No rashes, lesions or ulcers Psychiatry: Judgement and insight appear normal. Mood & affect appropriate.   Data Reviewed: I have personally reviewed following labs and imaging studies  CBC:  Recent Labs Lab 05/16/16 1011 05/17/16 0609  WBC 9.1 6.2  HGB 13.3 11.1*  HCT 41.8 35.5*  MCV 96.8 95.4  PLT 168 [id number]   Basic Metabolic Panel:  Recent Labs Lab 05/16/16 1011 05/17/16 0609  NA 138 139  K 4.0 3.5  CL 105 107  CO2 22 26  GLUCOSE 178* 117*  BUN 23* 19  CREATININE 1.27* 1.26*  CALCIUM 8.9 8.2*   GFR: Estimated Creatinine Clearance: 46.5 mL/min (by C-G formula based on SCr of 1.26 mg/dL (H)). Liver Function Tests: No results for input(s): AST, ALT, ALKPHOS, BILITOT, PROT, ALBUMIN in the last 168 hours. No results for input(s): LIPASE, AMYLASE in the last 168 hours. No results for input(s): AMMONIA in the last 168 hours. Coagulation Profile: No results for input(s): INR, PROTIME in the last 168 hours. Cardiac Enzymes: No results for input(s): CKTOTAL, CKMB, CKMBINDEX, TROPONINI in the last 168 hours. BNP (last 3 results) No results for input(s): PROBNP in the last 8760 hours. HbA1C: No results for input(s): HGBA1C in the last 72 hours. CBG:  Recent Labs Lab 05/16/16 1036 05/16/16 1827 05/16/16 2133 05/17/16 0809 05/17/16 1216  GLUCAP 183* 136* 135* 119* 118*   Lipid Profile: No results for input(s): CHOL, HDL, LDLCALC, TRIG, CHOLHDL, LDLDIRECT in the last 72 hours. Thyroid Function Tests:  Recent Labs  05/16/16 1811  TSH 0.819   Anemia Panel:  Recent Labs  05/16/16 1811  VITAMINB12 188   Urine analysis:    Component Value Date/Time   COLORURINE AMBER (A) 05/16/2016 1312   APPEARANCEUR CLOUDY (A) 05/16/2016 1312    LABSPEC 1.019 05/16/2016 1312   PHURINE 5.0 05/16/2016 1312   GLUCOSEU NEGATIVE 05/16/2016 1312   HGBUR MODERATE (A) 05/16/2016 1312   BILIRUBINUR NEGATIVE 05/16/2016 1312   KETONESUR NEGATIVE 05/16/2016 1312   PROTEINUR NEGATIVE 05/16/2016 1312   UROBILINOGEN 0.2 03/06/2014 0936   NITRITE POSITIVE (A) 05/16/2016 1312   LEUKOCYTESUR LARGE (A) 05/16/2016 1312   Sepsis Labs: @LABRCNTIP (procalcitonin:4,lacticidven:4)  ) Recent Results (from the past 240 hour(s))  Urine culture     Status: Abnormal (Preliminary result)   Collection Time: 05/16/16  2:26 PM  Result Value Ref Range Status   Specimen Description URINE, CLEAN CATCH  Final   Special Requests NONE  Final   Culture >=100,000 COLONIES/mL ESCHERICHIA COLI (A)  Final   Report Status PENDING  Incomplete     Invalid input(s): PROCALCITONIN, LACTICACIDVEN   Radiology Studies: Ct Head Wo Contrast  Result Date: 05/16/2016 CLINICAL DATA:  Confusion weakness this morning. EXAM: CT HEAD WITHOUT CONTRAST TECHNIQUE: Contiguous axial images were obtained from the base of the skull through the vertex without intravenous contrast. COMPARISON:  MR brain 03/28/2015. FINDINGS: Brain: No evidence of an acute infarct, acute hemorrhage, mass lesion, mass  effect or hydrocephalus. Atrophy. Patchy periventricular low attenuation. Vascular: No hyperdense vessel or unexpected calcification. Skull: Normal. Negative for fracture or focal lesion. Sinuses/Orbits: No acute finding. Other: None. IMPRESSION: 1. No acute intracranial abnormality. 2. Atrophy and chronic microvascular white matter ischemic changes. Electronically Signed   By: Lorin Picket M.D.   On: 05/16/2016 11:10   Mr Brain Wo Contrast  Result Date: 05/16/2016 CLINICAL DATA:  Leg weakness, difficulty with memory. Unable to walk. EXAM: MRI HEAD WITHOUT CONTRAST TECHNIQUE: Multiplanar, multiecho pulse sequences of the brain and surrounding structures were obtained without intravenous  contrast. COMPARISON:  04/17/2015 FINDINGS: Brain: No acute infarction, hemorrhage, hydrocephalus, extra-axial collection or mass lesion. Chronic microvascular disease with ischemic gliosis confluent around the lateral ventricles. Remote perforator infarct affecting the of left upper internal capsule and corona radiata. No notable atrophy for age. No chronic blood products. Vascular: Normal flow voids. Skull and upper cervical spine: Negative Sinuses/Orbits: Bilateral cataract resection.  No acute finding. IMPRESSION: 1. No acute finding or change from 2016. 2. Moderate chronic small vessel disease. Electronically Signed   By: Monte Fantasia M.D.   On: 05/16/2016 14:51        Scheduled Meds: . cefTRIAXone (ROCEPHIN)  IV  1 g Intravenous Q24H  . clopidogrel  75 mg Oral Daily  . enoxaparin (LOVENOX) injection  40 mg Subcutaneous Q24H  . insulin aspart  0-5 Units Subcutaneous QHS  . insulin aspart  0-9 Units Subcutaneous TID WC  . lisinopril  20 mg Oral Daily  . meloxicam  15 mg Oral Daily  . pravastatin  40 mg Oral Daily   Continuous Infusions:   LOS: 0 days    Time spent: 35 minutes    Eboni Coval A, MD [facility] Pager [phone]  If 7PM-7AM, please contact night-coverage [email or web address] Password [email or web address] 05/17/2016, 1:06 PM

## 2016-05-17 NOTE — Evaluation (Signed)
Physical Therapy Evaluation Patient Details Name: Christine Zavala MRN: [id number] DOB: [birth year] Today's Date: 05/17/2016   History of Present Illness  pt presents with weakness and confusion, found to have UTI.  pt with hx of Dementia, DM, CVA, HTN, and Rectal CA.    Clinical Impression  Pt incontinent of BM in bed and unclear how long she had been sitting there as she states she knew she had a BM, but didn't want to bother anyone.  Pt ed on importance of peri hygiene especially given that she was found to have a UTI.  At this time pt will need 24hr care at D/C and unclear if family are able to provide this as pt seems to indicate she is alone for part of the day.  Feel pt may nee SNF level of care at D/C, unless family able to care for pt at home, then would need HHPT and consider [facility].  Will continue to follow.      Follow Up Recommendations SNF    Equipment Recommendations  None recommended by PT    Recommendations for Other Services       Precautions / Restrictions Precautions Precautions: Fall Restrictions Weight Bearing Restrictions: No      Mobility  Bed Mobility Overal bed mobility: Needs Assistance Bed Mobility: Supine to Sit     Supine to sit: Mod assist;HOB elevated     General bed mobility comments: pt with heavy use of bed rail and needed A for bringing LEs out of bed and used pad to scoot hips to EOB.    Transfers Overall transfer level: Needs assistance Equipment used: Rolling walker (2 wheeled) Transfers: Sit to/from [facility] Sit to Stand: Mod assist Stand pivot transfers: Min assist       General transfer comment: cues for UE use and for step-by-step movement through pivot to recliner.  pt needs A for power up to standing and management of RW during pivot.  pt fatigued after standing for peri hygiene and completing transfer.    Ambulation/Gait                Stairs            Wheelchair Mobility    Modified Rankin  (Stroke Patients Only)       Balance Overall balance assessment: Needs assistance Sitting-balance support: No upper extremity supported;Feet supported Sitting balance-Leahy Scale: Fair     Standing balance support: Bilateral upper extremity supported;During functional activity Standing balance-Leahy Scale: Poor                               Pertinent Vitals/Pain Pain Assessment: No/denies pain    Home Living Family/patient expects to be discharged to:: Skilled nursing facility Living Arrangements: Children                    Prior Function Level of Independence: Needs assistance   Gait / Transfers Assistance Needed: pt uses RW and seems to only amb short distances.  ADL's / Homemaking Assistance Needed: Unclear how much pt was able to do on her own        Hand Dominance        Extremity/Trunk Assessment   Upper Extremity Assessment Upper Extremity Assessment: Generalized weakness    Lower Extremity Assessment Lower Extremity Assessment: Generalized weakness    Cervical / Trunk Assessment Cervical / Trunk Assessment: Kyphotic  Communication   Communication: No difficulties  Cognition  Arousal/Alertness: Awake/alert Behavior During Therapy: WFL for tasks assessed/performed Overall Cognitive Status: No family/caregiver present to determine baseline cognitive functioning                 General Comments: pt seems to have memory deficits and difficulties with attention and problem solving, however unclear if this is baseline for pt.      General Comments      Exercises     Assessment/Plan    PT Assessment Patient needs continued PT services  PT Problem List Decreased strength;Decreased activity tolerance;Decreased balance;Decreased mobility;Decreased coordination;Decreased cognition;Decreased knowledge of use of DME;Decreased safety awareness;Obesity          PT Treatment Interventions DME instruction;Gait training;Functional  mobility training;Stair training;Therapeutic activities;Therapeutic exercise;Balance training;Cognitive remediation;Patient/family education    PT Goals (Current goals can be found in the Care Plan section)  Acute Rehab PT Goals Patient Stated Goal: Feel better PT Goal Formulation: With patient Time For Goal Achievement: 05/31/16 Potential to Achieve Goals: Good    Frequency Min 2X/week   Barriers to discharge Other (comment) Unclear level of A pt has at home.    Co-evaluation               End of Session Equipment Utilized During Treatment: Gait belt Activity Tolerance: Patient limited by fatigue Patient left: in chair;with call bell/phone within reach;with chair alarm set Nurse Communication: Mobility status    Functional Assessment Tool Used: Clinical Judgement Functional Limitation: Mobility: Walking and moving around Mobility: Walking and Moving Around Current Status [id number]): At least 20 percent but less than 40 percent impaired, limited or restricted Mobility: Walking and Moving Around Goal Status [phone]): 0 percent impaired, limited or restricted    Time: 0928-0955 PT Time Calculation (min) (ACUTE ONLY): 27 min   Charges:   PT Evaluation $PT Eval Moderate Complexity: 1 Procedure PT Treatments $Therapeutic Activity: 8-22 mins   PT G Codes:   PT G-Codes **NOT FOR INPATIENT CLASS** Functional Assessment Tool Used: Clinical Judgement Functional Limitation: Mobility: Walking and moving around Mobility: Walking and Moving Around Current Status [id number]): At least 20 percent but less than 40 percent impaired, limited or restricted Mobility: Walking and Moving Around Goal Status [phone]): 0 percent impaired, limited or restricted    Catarina Hartshorn, PT  [phone] 05/17/2016, 1:29 PM

## 2016-05-18 DIAGNOSIS — R531 Weakness: Secondary | ICD-10-CM | POA: Diagnosis not present

## 2016-05-18 DIAGNOSIS — Z888 Allergy status to other drugs, medicaments and biological substances status: Secondary | ICD-10-CM | POA: Diagnosis not present

## 2016-05-18 DIAGNOSIS — B9620 Unspecified Escherichia coli [E. coli] as the cause of diseases classified elsewhere: Secondary | ICD-10-CM | POA: Diagnosis present

## 2016-05-18 DIAGNOSIS — Z885 Allergy status to narcotic agent status: Secondary | ICD-10-CM | POA: Diagnosis not present

## 2016-05-18 DIAGNOSIS — F0390 Unspecified dementia without behavioral disturbance: Secondary | ICD-10-CM | POA: Diagnosis present

## 2016-05-18 DIAGNOSIS — R197 Diarrhea, unspecified: Secondary | ICD-10-CM | POA: Diagnosis present

## 2016-05-18 DIAGNOSIS — N179 Acute kidney failure, unspecified: Secondary | ICD-10-CM | POA: Diagnosis not present

## 2016-05-18 DIAGNOSIS — Z8673 Personal history of transient ischemic attack (TIA), and cerebral infarction without residual deficits: Secondary | ICD-10-CM | POA: Diagnosis not present

## 2016-05-18 DIAGNOSIS — N390 Urinary tract infection, site not specified: Secondary | ICD-10-CM | POA: Diagnosis present

## 2016-05-18 DIAGNOSIS — E119 Type 2 diabetes mellitus without complications: Secondary | ICD-10-CM | POA: Diagnosis present

## 2016-05-18 DIAGNOSIS — M5136 Other intervertebral disc degeneration, lumbar region: Secondary | ICD-10-CM | POA: Diagnosis not present

## 2016-05-18 DIAGNOSIS — Z79899 Other long term (current) drug therapy: Secondary | ICD-10-CM | POA: Diagnosis not present

## 2016-05-18 DIAGNOSIS — I10 Essential (primary) hypertension: Secondary | ICD-10-CM | POA: Diagnosis present

## 2016-05-18 DIAGNOSIS — Z6841 Body Mass Index (BMI) 40.0 and over, adult: Secondary | ICD-10-CM | POA: Diagnosis not present

## 2016-05-18 DIAGNOSIS — R600 Localized edema: Secondary | ICD-10-CM | POA: Diagnosis present

## 2016-05-18 DIAGNOSIS — G9341 Metabolic encephalopathy: Secondary | ICD-10-CM | POA: Diagnosis present

## 2016-05-18 DIAGNOSIS — Z886 Allergy status to analgesic agent status: Secondary | ICD-10-CM | POA: Diagnosis not present

## 2016-05-18 DIAGNOSIS — Z87891 Personal history of nicotine dependence: Secondary | ICD-10-CM | POA: Diagnosis not present

## 2016-05-18 DIAGNOSIS — G9340 Encephalopathy, unspecified: Secondary | ICD-10-CM | POA: Diagnosis not present

## 2016-05-18 DIAGNOSIS — E669 Obesity, unspecified: Secondary | ICD-10-CM | POA: Diagnosis present

## 2016-05-18 DIAGNOSIS — E860 Dehydration: Secondary | ICD-10-CM | POA: Diagnosis present

## 2016-05-18 DIAGNOSIS — Z7984 Long term (current) use of oral hypoglycemic drugs: Secondary | ICD-10-CM | POA: Diagnosis not present

## 2016-05-18 DIAGNOSIS — Z85048 Personal history of other malignant neoplasm of rectum, rectosigmoid junction, and anus: Secondary | ICD-10-CM | POA: Diagnosis not present

## 2016-05-18 DIAGNOSIS — Z7902 Long term (current) use of antithrombotics/antiplatelets: Secondary | ICD-10-CM | POA: Diagnosis not present

## 2016-05-18 DIAGNOSIS — E785 Hyperlipidemia, unspecified: Secondary | ICD-10-CM | POA: Diagnosis present

## 2016-05-18 LAB — CBC
HEMATOCRIT: 36.3 % (ref 36.0–46.0)
HEMOGLOBIN: 11.3 g/dL — AB (ref 12.0–15.0)
MCH: 29.9 pg (ref 26.0–34.0)
MCHC: 31.1 g/dL (ref 30.0–36.0)
MCV: 96 fL (ref 78.0–100.0)
Platelets: 149 10*3/uL — ABNORMAL LOW (ref 150–400)
RBC: 3.78 MIL/uL — ABNORMAL LOW (ref 3.87–5.11)
RDW: 14.2 % (ref 11.5–15.5)
WBC: 4.3 10*3/uL (ref 4.0–10.5)

## 2016-05-18 LAB — BASIC METABOLIC PANEL
ANION GAP: 7 (ref 5–15)
BUN: 16 mg/dL (ref 6–20)
CALCIUM: 8.2 mg/dL — AB (ref 8.9–10.3)
CO2: 21 mmol/L — AB (ref 22–32)
Chloride: 110 mmol/L (ref 101–111)
Creatinine, Ser: 1.01 mg/dL — ABNORMAL HIGH (ref 0.44–1.00)
GFR calc Af Amer: 59 mL/min — ABNORMAL LOW (ref >60)
GFR calc non Af Amer: 51 mL/min — ABNORMAL LOW (ref >60)
GLUCOSE: 107 mg/dL — AB (ref 65–99)
Potassium: 3.9 mmol/L (ref 3.5–5.1)
Sodium: 138 mmol/L (ref 135–145)

## 2016-05-18 LAB — GLUCOSE, CAPILLARY
GLUCOSE-CAPILLARY: 100 mg/dL — AB (ref 65–99)
GLUCOSE-CAPILLARY: 125 mg/dL — AB (ref 65–99)
Glucose-Capillary: 113 mg/dL — ABNORMAL HIGH (ref 65–99)
Glucose-Capillary: 112 mg/dL — ABNORMAL HIGH (ref 65–99)

## 2016-05-18 LAB — URINE CULTURE: Culture: >=100000 — AB

## 2016-05-18 MED ORDER — CEFUROXIME AXETIL 500 MG PO TABS: 500.0000 mg | ORAL_TABLET | Freq: Two times a day (BID) | ORAL | 0 refills | Status: DC

## 2016-05-18 NOTE — Progress Notes (Signed)
PROGRESS NOTE  Christine Zavala  [id number] DOB: 09-21-1933 DOA: 05/16/2016 PCP: Vic Blackbird, MD Outpatient Specialists:  Subjective: Still feeling weak, hard to get around, incontinent, unable to get up on her own. PT evaluated recommend to SNF.  Brief Narrative:  Christine Zavala is a 82 y.o. female with medical history significant units of disc disease diabetes obesity, CVA hyperlipidemia does emergency Department chief complaint of generalized weakness. Initial workup reveals a tract infection  Information is obtained from the patient and the family is at the bedside. Patient was in her usual state of health until last night she attempted to get out of bed and just "slid to the floor. She states that she could not stand as her "legs would not work. She reports increased lower extremity edema. She reports bilateral pain from hip to toe. She had symptoms include decreased oral intake dysuria. She denies headache dizziness syncope or near-syncope. She denies chest pain palpitations shortness of breath orthopnea. She denies fever chills abdominal pain nausea vomiting diarrhea. Family also indicates patient is having intermittent brief episodes of confusion. Reports of slurred speech focal deficits  Assessment & Plan:   Principal Problem:   Generalized weakness Active Problems:   Hypertension   Diabetes mellitus without complication (HCC)   Hyperlipidemia   Dementia   DDD (degenerative disc disease), lumbar   UTI (urinary tract infection)   Acute encephalopathy   Acute kidney injury ([place])   Generalized weakness -Likely related to urinary tract infection. CT of the head unremarkable. MRI of the brain without acute changes.  -This is likely secondary to UTI and dehydration. -Gentle IV fluid hydration, treat UTI and obtain physical therapy evaluation.  UTI -Urinalysis showed UTI strongly with TNTC WBCs. -Started on Rocephin, continue, adjust antibiotics according to culture  results.  Acute encephalopathy -Patient has mild dementia that she developed intermittent episodes of worsening confusion. -Acute metabolic encephalopathy likely secondary to UTI.  -This is improving.  Hypertension. Fair control. Home medications include lisinopril -Continue lisinopril -Monitor  Diabetes. Serum glucose 178. Home medications include metformin. -Hold metformin for now her appetite is unreliable -Obtain a hemoglobin A1c -Use sliding scale insulin for optimal control  Acute kidney injury. Mild. Creatinine 1.2 on admission. Baseline appears to be 1.1 -Not sure if this is chronic kidney disease or acute kidney injury, continue gentle IV fluid hydration, check BMP in a.m.  Dementia. Appears to be stable at baseline on admission. See #1 -Continue home medications   DVT prophylaxis:  Code Status: Full Code Family Communication:  Disposition Plan:  Diet: Diet heart healthy/carb modified Room service appropriate? Yes; Fluid consistency: Thin Diet - low sodium heart healthy  Consultants:   None  Procedures:   None  Antimicrobials:   None   Objective: Vitals:   05/17/16 1430 05/17/16 2044 05/18/16 0502 05/18/16 0948  BP: (!) 118/59 (!) 130/56 (!) 144/55 (!) 144/73  Pulse: 72 78 72   Resp:  19 18   Temp:  98.4 F (36.9 C) 97.7 F (36.5 C)   TempSrc:  Oral Oral   SpO2:  100% 99%   Weight:      Height:        Intake/Output Summary (Last 24 hours) at 05/18/16 1115 Last data filed at 05/17/16 1500  Gross per 24 hour  Intake               25 ml  Output                0 ml  Net               25 ml   Filed Weights   05/16/16 1009 05/16/16 1728  Weight: 122.5 kg (270 lb) 124.7 kg (275 lb)    Examination: General exam: Appears calm and comfortable  Respiratory system: Clear to auscultation. Respiratory effort normal. Cardiovascular system: S1 & S2 heard, RRR. No JVD, murmurs, rubs, gallops or clicks. No pedal edema. Gastrointestinal system:  Abdomen is nondistended, soft and nontender. No organomegaly or masses felt. Normal bowel sounds heard. Central nervous system: Alert and oriented. No focal neurological deficits. Extremities: Symmetric 5 x 5 power. Skin: No rashes, lesions or ulcers Psychiatry: Judgement and insight appear normal. Mood & affect appropriate.   Data Reviewed: I have personally reviewed following labs and imaging studies  CBC:  Recent Labs Lab 05/16/16 1011 05/17/16 0609 05/18/16 0502  WBC 9.1 6.2 4.3  HGB 13.3 11.1* 11.3*  HCT 41.8 35.5* 36.3  MCV 96.8 95.4 96.0  PLT 168 157 [id number]*   Basic Metabolic Panel:  Recent Labs Lab 05/16/16 1011 05/17/16 0609 05/18/16 0502  NA 138 139 138  K 4.0 3.5 3.9  CL 105 107 110  CO2 22 26 21*  GLUCOSE 178* 117* 107*  BUN 23* 19 16  CREATININE 1.27* 1.26* 1.01*  CALCIUM 8.9 8.2* 8.2*   GFR: Estimated Creatinine Clearance: 58 mL/min (by C-G formula based on SCr of 1.01 mg/dL (H)). Liver Function Tests: No results for input(s): AST, ALT, ALKPHOS, BILITOT, PROT, ALBUMIN in the last 168 hours. No results for input(s): LIPASE, AMYLASE in the last 168 hours. No results for input(s): AMMONIA in the last 168 hours. Coagulation Profile: No results for input(s): INR, PROTIME in the last 168 hours. Cardiac Enzymes: No results for input(s): CKTOTAL, CKMB, CKMBINDEX, TROPONINI in the last 168 hours. BNP (last 3 results) No results for input(s): PROBNP in the last 8760 hours. HbA1C: No results for input(s): HGBA1C in the last 72 hours. CBG:  Recent Labs Lab 05/17/16 0809 05/17/16 1216 05/17/16 1659 05/17/16 2040 05/18/16 0757  GLUCAP 119* 118* 122* 108* 100*   Lipid Profile: No results for input(s): CHOL, HDL, LDLCALC, TRIG, CHOLHDL, LDLDIRECT in the last 72 hours. Thyroid Function Tests:  Recent Labs  05/16/16 1811  TSH 0.819   Anemia Panel:  Recent Labs  05/16/16 1811  VITAMINB12 188   Urine analysis:    Component Value Date/Time    COLORURINE AMBER (A) 05/16/2016 1312   APPEARANCEUR CLOUDY (A) 05/16/2016 1312   LABSPEC 1.019 05/16/2016 1312   PHURINE 5.0 05/16/2016 1312   GLUCOSEU NEGATIVE 05/16/2016 1312   HGBUR MODERATE (A) 05/16/2016 1312   BILIRUBINUR NEGATIVE 05/16/2016 1312   KETONESUR NEGATIVE 05/16/2016 1312   PROTEINUR NEGATIVE 05/16/2016 1312   UROBILINOGEN 0.2 03/06/2014 0936   NITRITE POSITIVE (A) 05/16/2016 1312   LEUKOCYTESUR LARGE (A) 05/16/2016 1312   Sepsis Labs: @LABRCNTIP (procalcitonin:4,lacticidven:4)  ) Recent Results (from the past 240 hour(s))  Urine culture     Status: Abnormal   Collection Time: 05/16/16  2:26 PM  Result Value Ref Range Status   Specimen Description URINE, CLEAN CATCH  Final   Special Requests NONE  Final   Culture >=100,000 COLONIES/mL ESCHERICHIA COLI (A)  Final   Report Status 05/18/2016 FINAL  Final   Organism ID, Bacteria ESCHERICHIA COLI (A)  Final      Susceptibility   Escherichia coli - MIC*    AMPICILLIN >=32 RESISTANT Resistant     CEFAZOLIN 8 SENSITIVE Sensitive  CEFTRIAXONE <=1 SENSITIVE Sensitive     CIPROFLOXACIN <=0.25 SENSITIVE Sensitive     GENTAMICIN <=1 SENSITIVE Sensitive     IMIPENEM <=0.25 SENSITIVE Sensitive     NITROFURANTOIN <=16 SENSITIVE Sensitive     TRIMETH/SULFA <=20 SENSITIVE Sensitive     AMPICILLIN/SULBACTAM >=32 RESISTANT Resistant     PIP/TAZO 8 SENSITIVE Sensitive     Extended ESBL NEGATIVE Sensitive     * >=100,000 COLONIES/mL ESCHERICHIA COLI     Invalid input(s): PROCALCITONIN, LACTICACIDVEN   Radiology Studies: Mr Brain 75 Contrast  Result Date: 05/16/2016 CLINICAL DATA:  Leg weakness, difficulty with memory. Unable to walk. EXAM: MRI HEAD WITHOUT CONTRAST TECHNIQUE: Multiplanar, multiecho pulse sequences of the brain and surrounding structures were obtained without intravenous contrast. COMPARISON:  04/17/2015 FINDINGS: Brain: No acute infarction, hemorrhage, hydrocephalus, extra-axial collection or mass  lesion. Chronic microvascular disease with ischemic gliosis confluent around the lateral ventricles. Remote perforator infarct affecting the of left upper internal capsule and corona radiata. No notable atrophy for age. No chronic blood products. Vascular: Normal flow voids. Skull and upper cervical spine: Negative Sinuses/Orbits: Bilateral cataract resection.  No acute finding. IMPRESSION: 1. No acute finding or change from 2016. 2. Moderate chronic small vessel disease. Electronically Signed   By: Monte Fantasia M.D.   On: 05/16/2016 14:51        Scheduled Meds: . cefTRIAXone (ROCEPHIN)  IV  1 g Intravenous Q24H  . clopidogrel  75 mg Oral Daily  . enoxaparin (LOVENOX) injection  40 mg Subcutaneous Q24H  . insulin aspart  0-5 Units Subcutaneous QHS  . insulin aspart  0-9 Units Subcutaneous TID WC  . lisinopril  20 mg Oral Daily  . meloxicam  15 mg Oral Daily  . pravastatin  40 mg Oral Daily   Continuous Infusions:   LOS: 0 days    Time spent: 35 minutes    Gerron Guidotti A, MD [facility] Pager [phone]  If 7PM-7AM, please contact night-coverage [email or web address] Password [email or web address] 05/18/2016, 11:15 AM

## 2016-05-19 LAB — BASIC METABOLIC PANEL
ANION GAP: 8 (ref 5–15)
BUN: 13 mg/dL (ref 6–20)
CALCIUM: 8.3 mg/dL — AB (ref 8.9–10.3)
CO2: 22 mmol/L (ref 22–32)
CREATININE: 1 mg/dL (ref 0.44–1.00)
Chloride: 110 mmol/L (ref 101–111)
GFR calc Af Amer: >60 mL/min (ref >60)
GFR, EST NON AFRICAN AMERICAN: 52 mL/min — AB (ref >60)
GLUCOSE: 111 mg/dL — AB (ref 65–99)
Potassium: 3.8 mmol/L (ref 3.5–5.1)
Sodium: 140 mmol/L (ref 135–145)

## 2016-05-19 LAB — CBC
HCT: 35.9 % — ABNORMAL LOW (ref 36.0–46.0)
HEMOGLOBIN: 11.3 g/dL — AB (ref 12.0–15.0)
MCH: 29.7 pg (ref 26.0–34.0)
MCHC: 31.5 g/dL (ref 30.0–36.0)
MCV: 94.5 fL (ref 78.0–100.0)
PLATELETS: 163 10*3/uL (ref 150–400)
RBC: 3.8 MIL/uL — ABNORMAL LOW (ref 3.87–5.11)
RDW: 13.9 % (ref 11.5–15.5)
WBC: 5.8 10*3/uL (ref 4.0–10.5)

## 2016-05-19 LAB — GLUCOSE, CAPILLARY
GLUCOSE-CAPILLARY: 119 mg/dL — AB (ref 65–99)
GLUCOSE-CAPILLARY: 123 mg/dL — AB (ref 65–99)
Glucose-Capillary: 123 mg/dL — ABNORMAL HIGH (ref 65–99)
Glucose-Capillary: 108 mg/dL — ABNORMAL HIGH (ref 65–99)

## 2016-05-19 NOTE — NC FL2 (Signed)
[place] MEDICAID FL2 LEVEL OF CARE SCREENING TOOL     IDENTIFICATION  Patient Name: Christine Zavala Birthdate: 26-Feb-1934 Sex: female Admission Date (Current Location): 05/16/2016  [facility] and [place] Number:  Herbalist and Address:  [facility], [place] [address], [place], [place] [address]      Provider Number: [id number]  Attending Physician Name and Address:  Verlee Monte, MD  Relative Name and Phone Number:  [name], daughter, [phone]    Current Level of Care: Hospital Recommended Level of Care: [place] Prior Approval Number:    Date Approved/Denied:   PASRR Number: [id number]  Discharge Plan: SNF    Current Diagnoses: Patient Active Problem List   Diagnosis Date Noted  . Acute kidney injury ([place]) 05/17/2016  . Generalized weakness 05/16/2016  . UTI (urinary tract infection) 05/16/2016  . Acute encephalopathy 05/16/2016  . Vertigo 06/27/2015  . Abnormality of gait 04/12/2015  . Constipation 08/21/2014  . Itchy scalp 08/21/2014  . Osteoarthritis 03/06/2014  . Peripheral neuropathy ([place]) 03/06/2014  . Varicose veins 03/06/2014  . DDD (degenerative disc disease), lumbar 11/29/2013  . Renal cyst 11/29/2013  . OAB (overactive bladder) 08/24/2013  . Osteoporosis, unspecified   . Peripheral edema 11/15/2012  . History of rectal cancer   . Hyperlipidemia   . Dementia   . History of CVA (cerebrovascular accident)   . Hypertension   . Diabetes mellitus without complication ([place])     Orientation RESPIRATION BLADDER Height & Weight     Self, Time, Situation, Place (Some Dementia documented)  Normal Incontinent Weight: 124.7 kg (275 lb) Height:  5\' 4"  (162.6 cm)  BEHAVIORAL SYMPTOMS/MOOD NEUROLOGICAL BOWEL NUTRITION STATUS      Incontinent Diet (Please see DC Summary)  AMBULATORY STATUS COMMUNICATION OF NEEDS Skin   Extensive Assist Verbally Normal                       Personal Care Assistance Level  of Assistance  Bathing, Feeding, Dressing Bathing Assistance: Maximum assistance Feeding assistance: Independent Dressing Assistance: Limited assistance     Functional Limitations Info             SPECIAL CARE FACTORS FREQUENCY  PT (By licensed PT)     PT Frequency: 5x/week              Contractures Contractures Info: Not present    Additional Factors Info  Code Status, Allergies Code Status Info: Full Allergies Info: Aspirin, Calcium-containing Compounds, Codeine Sulfate, Lipitor Atorvastatin, Zocor Simvastatin, Zyrtec Cetirizine           Current Medications (05/19/2016):  This is the current hospital active medication list Current Facility-Administered Medications  Medication Dose Route Frequency Provider Last Rate Last Dose  . acetaminophen (TYLENOL) tablet 650 mg  650 mg Oral Q6H PRN Radene Gunning, NP       Or  . acetaminophen (TYLENOL) suppository 650 mg  650 mg Rectal Q6H PRN Radene Gunning, NP      . cefTRIAXone (ROCEPHIN) 1 g in dextrose 5 % 50 mL IVPB  1 g Intravenous Q24H Radene Gunning, NP   1 g at 05/18/16 1743  . clopidogrel (PLAVIX) tablet 75 mg  75 mg Oral Daily Radene Gunning, NP   75 mg at 05/19/16 [id number]  . enoxaparin (LOVENOX) injection 40 mg  40 mg Subcutaneous Q24H Radene Gunning, NP   40 mg at 05/18/16 1743  . insulin aspart (novoLOG) injection  0-5 Units  0-5 Units Subcutaneous QHS Lezlie Octave Black, NP      . insulin aspart (novoLOG) injection 0-9 Units  0-9 Units Subcutaneous TID WC Radene Gunning, NP   Stopped at 05/17/16 0800  . lisinopril (PRINIVIL,ZESTRIL) tablet 20 mg  20 mg Oral Daily Radene Gunning, NP   20 mg at 05/19/16 [id number]  . meloxicam (MOBIC) tablet 15 mg  15 mg Oral Daily Radene Gunning, NP   15 mg at 05/19/16 [id number]  . ondansetron (ZOFRAN) tablet 4 mg  4 mg Oral Q6H PRN Radene Gunning, NP       Or  . ondansetron [facility]) injection 4 mg  4 mg Intravenous Q6H PRN Radene Gunning, NP      . pravastatin (PRAVACHOL) tablet 40 mg  40 mg Oral Daily  Radene Gunning, NP   40 mg at 05/19/16 [id number]  . senna-docusate (Senokot-S) tablet 1 tablet  1 tablet Oral QHS PRN Radene Gunning, NP         Discharge Medications: Please see discharge summary for a list of discharge medications.  Relevant Imaging Results:  Relevant Lab Results:   Additional Information SSn: [place], [place]

## 2016-05-19 NOTE — Progress Notes (Signed)
PROGRESS NOTE  Christine Zavala  AO:[id number] DOB: 02/29/1934 DOA: 05/16/2016 PCP: Vic Blackbird, MD Outpatient Specialists:  Subjective: Not back to her baseline, reported severe diarrhea about 10-15 bowel movements yesterday. Obtain stool studies  Brief Narrative:  Christine Zavala is a 82 y.o. female with medical history significant units of disc disease diabetes obesity, CVA hyperlipidemia does emergency Department chief complaint of generalized weakness. Initial workup reveals a tract infection  Information is obtained from the patient and the family is at the bedside. Patient was in her usual state of health until last night she attempted to get out of bed and just "slid to the floor. She states that she could not stand as her "legs would not work. She reports increased lower extremity edema. She reports bilateral pain from hip to toe. She had symptoms include decreased oral intake dysuria. She denies headache dizziness syncope or near-syncope. She denies chest pain palpitations shortness of breath orthopnea. She denies fever chills abdominal pain nausea vomiting diarrhea. Family also indicates patient is having intermittent brief episodes of confusion. Reports of slurred speech focal deficits  Assessment & Plan:   Principal Problem:   Generalized weakness Active Problems:   Hypertension   Diabetes mellitus without complication (HCC)   Hyperlipidemia   Dementia   DDD (degenerative disc disease), lumbar   UTI (urinary tract infection)   Acute encephalopathy   Acute kidney injury ([place])   Generalized weakness -Likely related to urinary tract infection. CT of the head unremarkable. MRI of the brain without acute changes.  -This is likely secondary to UTI and dehydration. -Gentle IV fluid hydration, treat UTI and obtain physical therapy evaluation.  UTI -Urinalysis showed UTI strongly with TNTC WBCs. -Escherichia coli, resistant to ampicillin, currently on  Rocephin.  Diarrhea -Loose stools, check stool studies and rule out C. difficile.  Acute encephalopathy -Patient has mild dementia that she developed intermittent episodes of worsening confusion. -Acute metabolic encephalopathy likely secondary to UTI.  -She is conversant, I don't know her baseline but she is oriented to place and self not dates  Hypertension. Fair control. Home medications include lisinopril -Continue lisinopril -Monitor  Diabetes. Serum glucose 178. Home medications include metformin. -Hold metformin for now her appetite is unreliable -Obtain a hemoglobin A1c -Use sliding scale insulin for optimal control  Acute kidney injury. Mild. Creatinine 1.2 on admission. Baseline appears to be 1.1 -This is resolved with IV fluid hydration, creatinine is 1.0 today.  Dementia. Appears to be stable at baseline on admission. See #1 -Continue home medications  Physical deconditioning -Patient seen by PT and recommended skilled nursing facility, CSW consulted -To SNF when bed available.    DVT prophylaxis:  Code Status: Full Code Family Communication:  Disposition Plan:  Diet: Diet heart healthy/carb modified Room service appropriate? Yes; Fluid consistency: Thin Diet - low sodium heart healthy  Consultants:   None  Procedures:   None  Antimicrobials:   None   Objective: Vitals:   05/18/16 1425 05/18/16 2113 05/19/16 0529 05/19/16 0821  BP: 139/69 129/60 (!) 147/55 (!) 137/54  Pulse: 64 70 72   Resp: 17  18   Temp: 97.9 F (36.6 C) 99.1 F (37.3 C) 97.9 F (36.6 C)   TempSrc: Oral Oral Oral   SpO2: 98% 98% 98%   Weight:      Height:        Intake/Output Summary (Last 24 hours) at 05/19/16 1024 Last data filed at 05/18/16 1130  Gross per 24 hour  Intake  0 ml  Output              300 ml  Net             -300 ml   Filed Weights   05/16/16 1009 05/16/16 1728  Weight: 122.5 kg (270 lb) 124.7 kg (275 lb)     Examination: General exam: Appears calm and comfortable  Respiratory system: Clear to auscultation. Respiratory effort normal. Cardiovascular system: S1 & S2 heard, RRR. No JVD, murmurs, rubs, gallops or clicks. No pedal edema. Gastrointestinal system: Abdomen is nondistended, soft and nontender. No organomegaly or masses felt. Normal bowel sounds heard. Central nervous system: Alert and oriented. No focal neurological deficits. Extremities: Symmetric 5 x 5 power. Skin: No rashes, lesions or ulcers Psychiatry: Judgement and insight appear normal. Mood & affect appropriate.   Data Reviewed: I have personally reviewed following labs and imaging studies  CBC:  Recent Labs Lab 05/16/16 1011 05/17/16 0609 05/18/16 0502 05/19/16 0503  WBC 9.1 6.2 4.3 5.8  HGB 13.3 11.1* 11.3* 11.3*  HCT 41.8 35.5* 36.3 35.9*  MCV 96.8 95.4 96.0 94.5  PLT 168 157 149* [id number]   Basic Metabolic Panel:  Recent Labs Lab 05/16/16 1011 05/17/16 0609 05/18/16 0502 05/19/16 0503  NA 138 139 138 140  K 4.0 3.5 3.9 3.8  CL 105 107 110 110  CO2 22 26 21* 22  GLUCOSE 178* 117* 107* 111*  BUN 23* 19 16 13   CREATININE 1.27* 1.26* 1.01* 1.00  CALCIUM 8.9 8.2* 8.2* 8.3*   GFR: Estimated Creatinine Clearance: 58.6 mL/min (by C-G formula based on SCr of 1 mg/dL). Liver Function Tests: No results for input(s): AST, ALT, ALKPHOS, BILITOT, PROT, ALBUMIN in the last 168 hours. No results for input(s): LIPASE, AMYLASE in the last 168 hours. No results for input(s): AMMONIA in the last 168 hours. Coagulation Profile: No results for input(s): INR, PROTIME in the last 168 hours. Cardiac Enzymes: No results for input(s): CKTOTAL, CKMB, CKMBINDEX, TROPONINI in the last 168 hours. BNP (last 3 results) No results for input(s): PROBNP in the last 8760 hours. HbA1C: No results for input(s): HGBA1C in the last 72 hours. CBG:  Recent Labs Lab 05/18/16 0757 05/18/16 1206 05/18/16 1711 05/18/16 2137  05/19/16 0759  GLUCAP 100* 113* 112* 125* 119*   Lipid Profile: No results for input(s): CHOL, HDL, LDLCALC, TRIG, CHOLHDL, LDLDIRECT in the last 72 hours. Thyroid Function Tests:  Recent Labs  05/16/16 1811  TSH 0.819   Anemia Panel:  Recent Labs  05/16/16 1811  VITAMINB12 188   Urine analysis:    Component Value Date/Time   COLORURINE AMBER (A) 05/16/2016 1312   APPEARANCEUR CLOUDY (A) 05/16/2016 1312   LABSPEC 1.019 05/16/2016 1312   PHURINE 5.0 05/16/2016 1312   GLUCOSEU NEGATIVE 05/16/2016 1312   HGBUR MODERATE (A) 05/16/2016 1312   BILIRUBINUR NEGATIVE 05/16/2016 1312   KETONESUR NEGATIVE 05/16/2016 1312   PROTEINUR NEGATIVE 05/16/2016 1312   UROBILINOGEN 0.2 03/06/2014 0936   NITRITE POSITIVE (A) 05/16/2016 1312   LEUKOCYTESUR LARGE (A) 05/16/2016 1312   Sepsis Labs: @LABRCNTIP (procalcitonin:4,lacticidven:4)  ) Recent Results (from the past 240 hour(s))  Urine culture     Status: Abnormal   Collection Time: 05/16/16  2:26 PM  Result Value Ref Range Status   Specimen Description URINE, CLEAN CATCH  Final   Special Requests NONE  Final   Culture >=100,000 COLONIES/mL ESCHERICHIA COLI (A)  Final   Report Status 05/18/2016 FINAL  Final   Organism  ID, Bacteria ESCHERICHIA COLI (A)  Final      Susceptibility   Escherichia coli - MIC*    AMPICILLIN >=32 RESISTANT Resistant     CEFAZOLIN 8 SENSITIVE Sensitive     CEFTRIAXONE <=1 SENSITIVE Sensitive     CIPROFLOXACIN <=0.25 SENSITIVE Sensitive     GENTAMICIN <=1 SENSITIVE Sensitive     IMIPENEM <=0.25 SENSITIVE Sensitive     NITROFURANTOIN <=16 SENSITIVE Sensitive     TRIMETH/SULFA <=20 SENSITIVE Sensitive     AMPICILLIN/SULBACTAM >=32 RESISTANT Resistant     PIP/TAZO 8 SENSITIVE Sensitive     Extended ESBL NEGATIVE Sensitive     * >=100,000 COLONIES/mL ESCHERICHIA COLI     Invalid input(s): PROCALCITONIN, LACTICACIDVEN   Radiology Studies: No results found.      Scheduled Meds: . cefTRIAXone  (ROCEPHIN)  IV  1 g Intravenous Q24H  . clopidogrel  75 mg Oral Daily  . enoxaparin (LOVENOX) injection  40 mg Subcutaneous Q24H  . insulin aspart  0-5 Units Subcutaneous QHS  . insulin aspart  0-9 Units Subcutaneous TID WC  . lisinopril  20 mg Oral Daily  . meloxicam  15 mg Oral Daily  . pravastatin  40 mg Oral Daily   Continuous Infusions:   LOS: 1 day    Time spent: 35 minutes    Anastasiya Gowin A, MD [facility] Pager [phone]  If 7PM-7AM, please contact night-coverage [email or web address] Password [facility] 05/19/2016, 10:24 AM

## 2016-05-20 LAB — FOLATE RBC
FOLATE, HEMOLYSATE: 299 ng/mL
FOLATE, RBC: 849 ng/mL (ref >498)
Hematocrit: 35.2 % (ref 34.0–46.6)

## 2016-05-20 LAB — GLUCOSE, CAPILLARY
GLUCOSE-CAPILLARY: 137 mg/dL — AB (ref 65–99)
Glucose-Capillary: 103 mg/dL — ABNORMAL HIGH (ref 65–99)

## 2016-05-20 NOTE — Progress Notes (Signed)
Patient will DC to: [facility] Anticipated DC date: 05/20/16 Family notified: Daughter at bedside Transport by: Daughter (3pm)   Per MD patient ready for DC to [facility]. RN, patient, patient's family, and facility notified of DC. Discharge Summary sent to facility. RN given number for report [phone])  CSW signing off.  Cedric Fishman, [place] Social Worker [phone]

## 2016-05-20 NOTE — Clinical Social Work Note (Signed)
Clinical Social Work Assessment  Patient Details  Name: Christine Zavala MRN: [id number] Date of Birth: 12/09/1933  Date of referral:  05/20/16               Reason for consult:  Facility Placement                Permission sought to share information with:  Facility Contact Representative, Family Supports Permission granted to share information::  Yes, Verbal Permission Granted  Name::     [name]  Agency::  SNFs  Relationship::  Daughter  Contact Information:     Housing/Transportation Living arrangements for the past 2 months:  Single Family Home Source of Information:  Patient, Adult Children Patient Interpreter Needed:  None Criminal Activity/Legal Involvement Pertinent to Current Situation/Hospitalization:  No - Comment as needed Significant Relationships:  Adult Children Lives with:  Self Do you feel safe going back to the place where you live?  No Need for family participation in patient care:  Yes (Comment)  Care giving concerns:  CSW received consult for possible SNF placement at time of discharge. CSW met with patient and patient's daughter at bedside regarding PT recommendation of SNF placement at time of discharge. Patient reports living alone and feels unable to return home given patient's current physical needs and fall risk. Patient and patient's daughter expressed understanding of PT recommendation and are agreeable to SNF placement at time of discharge. CSW to continue to follow and assist with discharge planning needs.   Social Worker assessment / plan:  CSW spoke with patient and patient's daughter concerning possibility of rehab at SNF before returning home.  Employment status:  Retired Insurance information:  Managed Medicare PT Recommendations:  Skilled Nursing Facility Information / Referral to community resources:  Skilled Nursing Facility  Patient/Family's Response to care:  Patient and patient's daughter recognize need for rehab before returning home and are  agreeable to a SNF in [place]. CSW also answered questions regarding Medicaid for patient and her daughter.   Patient/Family's Understanding of and Emotional Response to Diagnosis, Current Treatment, and Prognosis:  Patient/family is realistic regarding therapy needs and expressed being hopeful for SNF placement. Patient expressed understanding of CSW role and discharge process. No questions/concerns about plan or treatment.    Emotional Assessment Appearance:  Appears stated age Attitude/Demeanor/Rapport:  Other (Appropriate) Affect (typically observed):  Accepting, Appropriate Orientation:  Oriented to Place, Oriented to Self, Oriented to  Time, Oriented to Situation Alcohol / Substance use:  Not Applicable Psych involvement (Current and /or in the community):  No (Comment)  Discharge Needs  Concerns to be addressed:  Care Coordination Readmission within the last 30 days:  No Current discharge risk:  None Barriers to Discharge:  No Barriers Identified    S , LCSWA 05/20/2016, 11:57 AM  

## 2016-05-20 NOTE — Discharge Summary (Addendum)
Physician Discharge Summary  [name] FAO:[id number] DOB: 1934/05/15 DOA: 05/16/2016  PCP: Vic Blackbird, MD  Admit date: 05/16/2016 Discharge date: 05/20/2016  Admitted From: Home Disposition: SNF  Recommendations for Outpatient Follow-up:  1. Follow up with PCP in 1-2 weeks 2. Please obtain BMP/CBC in one week  Home Health: NA Equipment/Devices:NA  Discharge Condition: Stable CODE STATUS: Full Code Diet recommendation: Diet heart healthy/carb modified Room service appropriate? Yes; Fluid consistency: Thin Diet - low sodium heart healthy  Brief/Interim Summary: Christine Zavala a 82 y.o.femalewith medical history significant units of disc disease diabetes obesity, CVA hyperlipidemia does emergency Department chief complaint of generalized weakness. Initial workup reveals a tract infection  Information is obtained from the patient and the family is at the bedside. Patient was in her usual state of health until last night she attempted to get out of bed and just "slid to the floor. She states that she could not stand as her "legs would not work. She reports increased lower extremity edema. She reports bilateral pain from hip to toe. She had symptoms include decreased oral intake dysuria. She denies headache dizziness syncope or near-syncope. She denies chest pain palpitations shortness of breath orthopnea. She denies fever chills abdominal pain nausea vomiting diarrhea. Family also indicates patient is having intermittent brief episodes of confusion. Reports of slurred speech focal deficits  Discharge Diagnoses:  Principal Problem:   Generalized weakness Active Problems:   Hypertension   Diabetes mellitus without complication (HCC)   Hyperlipidemia   Dementia   DDD (degenerative disc disease), lumbar   UTI (urinary tract infection)   Acute encephalopathy   Acute kidney injury ([place])   Generalized weakness -Likely related to urinary tract infection. CT of the head  unremarkable. MRI of the brain without acute changes.  -This is likely secondary to UTI and dehydration. -Gentle IV fluid hydration, PT recommended skilled nursing facility.  Escherichia coli UTI -Urinalysis showed UTI strongly with TNTC WBCs. -Urine culture grew Escherichia coli resistant only to ampicillin, discharged on Ceftin.  Diarrhea -Patient reported that she had the night before last about 10-15 episodes of loose stools, stool studies ordered. -Today per nursing staff patient stool is firm and does not have diarrhea.? Reliability of history secondary to dementia  Acute encephalopathy -Patient has mild dementia that she developed intermittent episodes of worsening confusion. -Acute metabolic encephalopathy likely secondary to UTI.  -This is improving, she is back to baseline. But she still physically needs rehabilitation  Hypertension. Fair control. Home medications include lisinopril -Continue lisinopril -Monitor  Diabetes. Serum glucose 178. Home medications include metformin. -Hold metformin for now her appetite is unreliable -Hemoglobin A1c 6.2 from October 2017 -Stephens Shire used in the hospital for optimal control.  Acute kidney injury. Mild. Creatinine 1.2 on admission. Baseline appears to be 1.1 -This is improved, creatinine down to 1.0.  Dementia. Appears to be stable at baseline on admission. See #1 -Continue home medications   Discharge Instructions  Discharge Instructions    Diet - low sodium heart healthy    Complete by:  As directed    Increase activity slowly    Complete by:  As directed      Allergies as of 05/20/2016      Reactions   Aspirin Nausea And Vomiting   Calcium-containing Compounds    Legs burn   Codeine Sulfate Nausea And Vomiting   Lipitor [atorvastatin]    myalgias   Zocor [simvastatin]    pruritis   Zyrtec [cetirizine]  Medication List    TAKE these medications   blood glucose meter kit and supplies Kit Pt needs  glucose monitor, strips (#100) and lancets (1 box). With prn refills.Marland KitchenMarland KitchenShe checks BS bid and DX: 250.00   cefUROXime 500 MG tablet Commonly known as:  CEFTIN Take 1 tablet (500 mg total) by mouth 2 (two) times daily with a meal.   clopidogrel 75 MG tablet Commonly known as:  PLAVIX TAKE 1 TABLET (75 MG TOTAL) BY MOUTH DAILY.   lisinopril 40 MG tablet Commonly known as:  PRINIVIL,ZESTRIL Take 0.5 tablets (20 mg total) by mouth daily.   meloxicam 15 MG tablet Commonly known as:  MOBIC Take 15 mg by mouth daily.   metFORMIN 500 MG tablet Commonly known as:  GLUCOPHAGE Take 1 tablet (500 mg total) by mouth 2 (two) times daily.   ONE TOUCH ULTRA TEST test strip Generic drug:  glucose blood   pravastatin 40 MG tablet Commonly known as:  PRAVACHOL Take 1 tablet (40 mg total) by mouth daily.       Allergies  Allergen Reactions  . Aspirin Nausea And Vomiting  . Calcium-Containing Compounds     Legs burn  . Codeine Sulfate Nausea And Vomiting  . Lipitor [Atorvastatin]     myalgias  . Zocor [Simvastatin]     pruritis  . Zyrtec [Cetirizine]     Consultations:  None  Procedures (Echo, Carotid, EGD, Colonoscopy, ERCP)   Radiological studies: Ct Head Wo Contrast  Result Date: 05/16/2016 CLINICAL DATA:  Confusion weakness this morning. EXAM: CT HEAD WITHOUT CONTRAST TECHNIQUE: Contiguous axial images were obtained from the base of the skull through the vertex without intravenous contrast. COMPARISON:  MR brain 03/28/2015. FINDINGS: Brain: No evidence of an acute infarct, acute hemorrhage, mass lesion, mass effect or hydrocephalus. Atrophy. Patchy periventricular low attenuation. Vascular: No hyperdense vessel or unexpected calcification. Skull: Normal. Negative for fracture or focal lesion. Sinuses/Orbits: No acute finding. Other: None. IMPRESSION: 1. No acute intracranial abnormality. 2. Atrophy and chronic microvascular white matter ischemic changes. Electronically Signed    By: Lorin Picket M.D.   On: 05/16/2016 11:10   Mr Brain Wo Contrast  Result Date: 05/16/2016 CLINICAL DATA:  Leg weakness, difficulty with memory. Unable to walk. EXAM: MRI HEAD WITHOUT CONTRAST TECHNIQUE: Multiplanar, multiecho pulse sequences of the brain and surrounding structures were obtained without intravenous contrast. COMPARISON:  04/17/2015 FINDINGS: Brain: No acute infarction, hemorrhage, hydrocephalus, extra-axial collection or mass lesion. Chronic microvascular disease with ischemic gliosis confluent around the lateral ventricles. Remote perforator infarct affecting the of left upper internal capsule and corona radiata. No notable atrophy for age. No chronic blood products. Vascular: Normal flow voids. Skull and upper cervical spine: Negative Sinuses/Orbits: Bilateral cataract resection.  No acute finding. IMPRESSION: 1. No acute finding or change from 2016. 2. Moderate chronic small vessel disease. Electronically Signed   By: Monte Fantasia M.D.   On: 05/16/2016 14:51     Subjective:  Discharge Exam: Vitals:   05/19/16 1430 05/19/16 2144 05/20/16 0502 05/20/16 0903  BP: 127/83 (!) 145/68 (!) 163/67 (!) 147/74  Pulse: 66 69 73   Resp: _0 Temp: 99.2 F (37.3 C) 98.2 F (36.8 C) 98.1 F (36.7 C)   TempSrc: Oral Oral Oral   SpO2: 97% 98% 98%   Weight:      Height:       General: Pt is alert, awake, not in acute distress Cardiovascular: RRR, S1/S2 +, no rubs, no gallops Respiratory:  CTA bilaterally, no wheezing, no rhonchi Abdominal: Soft, NT, ND, bowel sounds + Extremities: no edema, no cyanosis   The results of significant diagnostics from this hospitalization (including imaging, microbiology, ancillary and laboratory) are listed below for reference.    Microbiology: Recent Results (from the past 240 hour(s))  Urine culture     Status: Abnormal   Collection Time: 05/16/16  2:26 PM  Result Value Ref Range Status   Specimen Description URINE, CLEAN CATCH   Final   Special Requests NONE  Final   Culture >=100,000 COLONIES/mL ESCHERICHIA COLI (A)  Final   Report Status 05/18/2016 FINAL  Final   Organism ID, Bacteria ESCHERICHIA COLI (A)  Final      Susceptibility   Escherichia coli - MIC*    AMPICILLIN >=32 RESISTANT Resistant     CEFAZOLIN 8 SENSITIVE Sensitive     CEFTRIAXONE <=1 SENSITIVE Sensitive     CIPROFLOXACIN <=0.25 SENSITIVE Sensitive     GENTAMICIN <=1 SENSITIVE Sensitive     IMIPENEM <=0.25 SENSITIVE Sensitive     NITROFURANTOIN <=16 SENSITIVE Sensitive     TRIMETH/SULFA <=20 SENSITIVE Sensitive     AMPICILLIN/SULBACTAM >=32 RESISTANT Resistant     PIP/TAZO 8 SENSITIVE Sensitive     Extended ESBL NEGATIVE Sensitive     * >=100,000 COLONIES/mL ESCHERICHIA COLI     Labs: BNP (last 3 results) No results for input(s): BNP in the last 8760 hours. Basic Metabolic Panel:  Recent Labs Lab 05/16/16 1011 05/17/16 0609 05/18/16 0502 05/19/16 0503  NA 138 139 138 140  K 4.0 3.5 3.9 3.8  CL 105 107 110 110  CO2 22 26 21* 22  GLUCOSE 178* 117* 107* 111*  BUN 23* _0 CREATININE 1.27* 1.26* 1.01* 1.00  CALCIUM 8.9 8.2* 8.2* 8.3*   Liver Function Tests: No results for input(s): AST, ALT, ALKPHOS, BILITOT, PROT, ALBUMIN in the last 168 hours. No results for input(s): LIPASE, AMYLASE in the last 168 hours. No results for input(s): AMMONIA in the last 168 hours. CBC:  Recent Labs Lab 05/16/16 1011 05/17/16 0609 05/18/16 0502 05/19/16 0503  WBC 9.1 6.2 4.3 5.8  HGB 13.3 11.1* 11.3* 11.3*  HCT 41.8 35.5* 36.3 35.9*  MCV 96.8 95.4 96.0 94.5  PLT 168 157 149* 163   Cardiac Enzymes: No results for input(s): CKTOTAL, CKMB, CKMBINDEX, TROPONINI in the last 168 hours. BNP: Invalid input(s): POCBNP CBG:  Recent Labs Lab 05/19/16 0759 05/19/16 1146 05/19/16 1642 05/19/16 2142 05/20/16 0808  GLUCAP 119* 123* 123* 108* 103*   D-Dimer No results for input(s): DDIMER in the last 72 hours. Hgb A1c No results  for input(s): HGBA1C in the last 72 hours. Lipid Profile No results for input(s): CHOL, HDL, LDLCALC, TRIG, CHOLHDL, LDLDIRECT in the last 72 hours. Thyroid function studies No results for input(s): TSH, T4TOTAL, T3FREE, THYROIDAB in the last 72 hours.  Invalid input(s): FREET3 Anemia work up No results for input(s): VITAMINB12, FOLATE, FERRITIN, TIBC, IRON, RETICCTPCT in the last 72 hours. Urinalysis    Component Value Date/Time   COLORURINE AMBER (A) 05/16/2016 1312   APPEARANCEUR CLOUDY (A) 05/16/2016 1312   LABSPEC 1.019 05/16/2016 1312   PHURINE 5.0 05/16/2016 1312   GLUCOSEU NEGATIVE 05/16/2016 1312   HGBUR MODERATE (A) 05/16/2016 1312   BILIRUBINUR NEGATIVE 05/16/2016 1312   KETONESUR NEGATIVE 05/16/2016 1312   PROTEINUR NEGATIVE 05/16/2016 1312   UROBILINOGEN 0.2 03/06/2014 0936   NITRITE POSITIVE (A) 05/16/2016 1312   LEUKOCYTESUR LARGE (A) 05/16/2016 1312  Sepsis Labs Invalid input(s): PROCALCITONIN,  WBC,  LACTICIDVEN Microbiology Recent Results (from the past 240 hour(s))  Urine culture     Status: Abnormal   Collection Time: 05/16/16  2:26 PM  Result Value Ref Range Status   Specimen Description URINE, CLEAN CATCH  Final   Special Requests NONE  Final   Culture >=100,000 COLONIES/mL ESCHERICHIA COLI (A)  Final   Report Status 05/18/2016 FINAL  Final   Organism ID, Bacteria ESCHERICHIA COLI (A)  Final      Susceptibility   Escherichia coli - MIC*    AMPICILLIN >=32 RESISTANT Resistant     CEFAZOLIN 8 SENSITIVE Sensitive     CEFTRIAXONE <=1 SENSITIVE Sensitive     CIPROFLOXACIN <=0.25 SENSITIVE Sensitive     GENTAMICIN <=1 SENSITIVE Sensitive     IMIPENEM <=0.25 SENSITIVE Sensitive     NITROFURANTOIN <=16 SENSITIVE Sensitive     TRIMETH/SULFA <=20 SENSITIVE Sensitive     AMPICILLIN/SULBACTAM >=32 RESISTANT Resistant     PIP/TAZO 8 SENSITIVE Sensitive     Extended ESBL NEGATIVE Sensitive     * >=100,000 COLONIES/mL ESCHERICHIA COLI     Time  coordinating discharge: Over 30 minutes  SIGNED:   Birdie Hopes, MD  [facility] 05/20/2016, 10:25 AM Pager   If 7PM-7AM, please contact night-coverage [email or web address] Password [email or web address]

## 2016-05-20 NOTE — Care Management Note (Signed)
Case Management Note  Patient Details  Name: Christine Zavala MRN: [place]:[id number] Date of Birth: June 20, 1933  Subjective/Objective:                    Action/Plan:  DC to SNF today as facilitated by CSW.   Expected Discharge Date:                  Expected Discharge Plan:  Skilled Nursing Facility  In-House Referral:  Clinical Social Work  Discharge planning Services  CM Consult  Post Acute Care Choice:    Choice offered to:     DME Arranged:    DME Agency:     HH Arranged:    [place] Agency:     Status of Service:  Completed, signed off  If discussed at [employer], dates discussed:    Additional Comments:  Carles Collet, RN 05/20/2016, 10:33 AM

## 2016-05-20 NOTE — Clinical Social Work Placement (Signed)
   CLINICAL SOCIAL WORK PLACEMENT  NOTE  Date:  05/20/2016  Patient Details  Name: Christine Zavala MRN: [id number] Date of Birth: Sep 01, 1933  Clinical Social Work is seeking post-discharge placement for this patient at the [place] level of care (*CSW will initial, date and re-position this form in  chart as items are completed):  Yes   Patient/family provided with Interior Work Department's list of facilities offering this level of care within the geographic area requested by the patient (or if unable, by the patient's family).  Yes   Patient/family informed of their freedom to choose among providers that offer the needed level of care, that participate in Medicare, Medicaid or managed care program needed by the patient, have an available bed and are willing to accept the patient.  Yes   Patient/family informed of [facility]'s ownership interest in [facility] and [facility], as well as of the fact that they are under no obligation to receive care at these facilities.  PASRR submitted to EDS on 05/19/16     PASRR number received on 05/19/16     Existing PASRR number confirmed on       FL2 transmitted to all facilities in geographic area requested by pt/family on 05/19/16     FL2 transmitted to all facilities within larger geographic area on       Patient informed that his/her managed care company has contracts with or will negotiate with certain facilities, including the following:        Yes   Patient/family informed of bed offers received.  Patient chooses bed at [facility]     Physician recommends and patient chooses bed at      Patient to be transferred to [facility] on 05/20/16.  Patient to be transferred to facility by Daughter by car     Patient family notified on 05/20/16 of transfer.  Name of family member notified:  [name], daughter     PHYSICIAN Please sign FL2     Additional Comment:     _______________________________________________ Benard Halsted, [place] 05/20/2016, 11:59 AM

## 2016-05-20 NOTE — Progress Notes (Signed)
Gave report to Mardene Celeste at [facility] Discharge Note  Admit Date:  05/16/2016 Discharge date: 05/20/2016   Christine Zavala to be D/C'd Skilled nursing facility per MD order.  AVS completed.  Copy for chart, and copy for patient signed, and dated. Patient/caregiver able to verbalize understanding.  Discharge Medication: Allergies as of 05/20/2016      Reactions   Aspirin Nausea And Vomiting   Calcium-containing Compounds    Legs burn   Codeine Sulfate Nausea And Vomiting   Lipitor [atorvastatin]    myalgias   Zocor [simvastatin]    pruritis   Zyrtec [cetirizine]       Medication List    TAKE these medications   blood glucose meter kit and supplies Kit Pt needs glucose monitor, strips (#100) and lancets (1 box). With prn refills.Marland KitchenMarland KitchenShe checks BS bid and DX: 250.00   cefUROXime 500 MG tablet Commonly known as:  CEFTIN Take 1 tablet (500 mg total) by mouth 2 (two) times daily with a meal.   clopidogrel 75 MG tablet Commonly known as:  PLAVIX TAKE 1 TABLET (75 MG TOTAL) BY MOUTH DAILY.   lisinopril 40 MG tablet Commonly known as:  PRINIVIL,ZESTRIL Take 0.5 tablets (20 mg total) by mouth daily.   meloxicam 15 MG tablet Commonly known as:  MOBIC Take 15 mg by mouth daily.   metFORMIN 500 MG tablet Commonly known as:  GLUCOPHAGE Take 1 tablet (500 mg total) by mouth 2 (two) times daily.   ONE TOUCH ULTRA TEST test strip Generic drug:  glucose blood   pravastatin 40 MG tablet Commonly known as:  PRAVACHOL Take 1 tablet (40 mg total) by mouth daily.       Discharge Assessment: Vitals:   05/20/16 0502 05/20/16 0903  BP: (!) 163/67 (!) 147/74  Pulse: 73   Resp: 18   Temp: 98.1 F (36.7 C)    Skin has yeast under breasts and MASD in buttocks.  IV catheter discontinued intact. Site without signs and symptoms of complications - no redness or edema noted at insertion site, patient denies c/o pain - only slight tenderness at site.  Dressing with slight pressure  applied.  D/c Instructions-Education: Discharge instructions given to patient/family with verbalized understanding. D/c education completed with patient/family including follow up instructions, medication list, d/c activities limitations if indicated, with other d/c instructions as indicated by MD - patient able to verbalize understanding, all questions fully answered. Patient instructed to return to ED, call 911, or call MD for any changes in condition.  Patient escorted via [place], and D/C SNF via private auto.  Rubye Strohmeyer Margaretha Sheffield, RN 05/20/2016 4:07 PM

## 2016-05-21 DIAGNOSIS — I639 Cerebral infarction, unspecified: Secondary | ICD-10-CM | POA: Diagnosis not present

## 2016-05-21 DIAGNOSIS — I10 Essential (primary) hypertension: Secondary | ICD-10-CM | POA: Diagnosis not present

## 2016-05-21 DIAGNOSIS — N390 Urinary tract infection, site not specified: Secondary | ICD-10-CM | POA: Diagnosis not present

## 2016-05-21 DIAGNOSIS — G9340 Encephalopathy, unspecified: Secondary | ICD-10-CM | POA: Diagnosis not present

## 2016-05-26 DIAGNOSIS — I10 Essential (primary) hypertension: Secondary | ICD-10-CM | POA: Diagnosis not present

## 2016-05-26 DIAGNOSIS — R531 Weakness: Secondary | ICD-10-CM | POA: Diagnosis not present

## 2016-05-26 DIAGNOSIS — E119 Type 2 diabetes mellitus without complications: Secondary | ICD-10-CM | POA: Diagnosis not present

## 2016-05-26 DIAGNOSIS — J069 Acute upper respiratory infection, unspecified: Secondary | ICD-10-CM | POA: Diagnosis not present

## 2016-05-26 DIAGNOSIS — M5136 Other intervertebral disc degeneration, lumbar region: Secondary | ICD-10-CM | POA: Diagnosis not present

## 2016-05-26 DIAGNOSIS — E785 Hyperlipidemia, unspecified: Secondary | ICD-10-CM | POA: Diagnosis not present

## 2016-05-26 DIAGNOSIS — N390 Urinary tract infection, site not specified: Secondary | ICD-10-CM | POA: Diagnosis not present

## 2016-05-26 DIAGNOSIS — R2689 Other abnormalities of gait and mobility: Secondary | ICD-10-CM | POA: Diagnosis not present

## 2016-05-26 DIAGNOSIS — R278 Other lack of coordination: Secondary | ICD-10-CM | POA: Diagnosis not present

## 2016-05-26 DIAGNOSIS — N179 Acute kidney failure, unspecified: Secondary | ICD-10-CM | POA: Diagnosis not present

## 2016-05-29 DIAGNOSIS — I10 Essential (primary) hypertension: Secondary | ICD-10-CM | POA: Diagnosis not present

## 2016-05-29 DIAGNOSIS — N390 Urinary tract infection, site not specified: Secondary | ICD-10-CM | POA: Diagnosis not present

## 2016-05-29 DIAGNOSIS — R531 Weakness: Secondary | ICD-10-CM | POA: Diagnosis not present

## 2016-06-02 DIAGNOSIS — I10 Essential (primary) hypertension: Secondary | ICD-10-CM | POA: Diagnosis not present

## 2016-06-02 DIAGNOSIS — E119 Type 2 diabetes mellitus without complications: Secondary | ICD-10-CM | POA: Diagnosis not present

## 2016-06-02 DIAGNOSIS — J069 Acute upper respiratory infection, unspecified: Secondary | ICD-10-CM | POA: Diagnosis not present

## 2016-06-02 DIAGNOSIS — N390 Urinary tract infection, site not specified: Secondary | ICD-10-CM | POA: Diagnosis not present

## 2016-06-05 DIAGNOSIS — M5136 Other intervertebral disc degeneration, lumbar region: Secondary | ICD-10-CM | POA: Diagnosis not present

## 2016-06-05 DIAGNOSIS — R531 Weakness: Secondary | ICD-10-CM | POA: Diagnosis not present

## 2016-06-05 DIAGNOSIS — E119 Type 2 diabetes mellitus without complications: Secondary | ICD-10-CM | POA: Diagnosis not present

## 2016-06-05 DIAGNOSIS — I10 Essential (primary) hypertension: Secondary | ICD-10-CM | POA: Diagnosis not present

## 2016-06-06 ENCOUNTER — Other Ambulatory Visit: Payer: Self-pay | Admitting: Internal Medicine

## 2016-06-06 DIAGNOSIS — M47816 Spondylosis without myelopathy or radiculopathy, lumbar region: Secondary | ICD-10-CM

## 2016-06-10 ENCOUNTER — Other Ambulatory Visit: Payer: Self-pay | Admitting: *Deleted

## 2016-06-10 NOTE — Patient Outreach (Signed)
[facility]) Care Management  06/10/2016  Daurice Ovando June 25, 1933 [id number]   Met with patient at bedside of facility. Patient sitting up in chair, dressed and bags are packed. Patient states she is hoping to go home today due to bad weather coming in later in week. She states she lives with her son, Olivia Mackie. She states that he assists her with cooking and transportation. She states she is independent with ADL's and medication management.  RNCM reviewed [facility] program services. RNCM gave [facility] brochure and magnet. Patient states she wants to speak with son and daughter regarding [facility]. She states she expects to have home care services upon discharge.   Plan to sign off at this time.  Royetta Crochet. Laymond Purser, RN, BSN, [place] Acute Care Coordinator [phone]

## 2016-06-16 ENCOUNTER — Ambulatory Visit
Admission: RE | Admit: 2016-06-16 | Discharge: 2016-06-16 | Disposition: A | Discharge status: Home / Self Care | Payer: Medicare Other | Source: Ambulatory Visit | Attending: Internal Medicine | Admitting: Internal Medicine

## 2016-06-16 DIAGNOSIS — M5126 Other intervertebral disc displacement, lumbar region: Secondary | ICD-10-CM | POA: Diagnosis not present

## 2016-06-16 DIAGNOSIS — M47816 Spondylosis without myelopathy or radiculopathy, lumbar region: Secondary | ICD-10-CM

## 2016-06-17 DIAGNOSIS — Z7984 Long term (current) use of oral hypoglycemic drugs: Secondary | ICD-10-CM | POA: Diagnosis not present

## 2016-06-17 DIAGNOSIS — R278 Other lack of coordination: Secondary | ICD-10-CM | POA: Diagnosis not present

## 2016-06-17 DIAGNOSIS — Z791 Long term (current) use of non-steroidal anti-inflammatories (NSAID): Secondary | ICD-10-CM | POA: Diagnosis not present

## 2016-06-17 DIAGNOSIS — Z9181 History of falling: Secondary | ICD-10-CM | POA: Diagnosis not present

## 2016-06-17 DIAGNOSIS — Z7902 Long term (current) use of antithrombotics/antiplatelets: Secondary | ICD-10-CM | POA: Diagnosis not present

## 2016-06-17 DIAGNOSIS — R531 Weakness: Secondary | ICD-10-CM | POA: Diagnosis not present

## 2016-06-17 DIAGNOSIS — E119 Type 2 diabetes mellitus without complications: Secondary | ICD-10-CM | POA: Diagnosis not present

## 2016-06-17 DIAGNOSIS — R600 Localized edema: Secondary | ICD-10-CM | POA: Diagnosis not present

## 2016-06-17 DIAGNOSIS — M5136 Other intervertebral disc degeneration, lumbar region: Secondary | ICD-10-CM | POA: Diagnosis not present

## 2016-06-17 DIAGNOSIS — I10 Essential (primary) hypertension: Secondary | ICD-10-CM | POA: Diagnosis not present

## 2016-06-17 DIAGNOSIS — R2689 Other abnormalities of gait and mobility: Secondary | ICD-10-CM | POA: Diagnosis not present

## 2016-06-17 DIAGNOSIS — Z87440 Personal history of urinary (tract) infections: Secondary | ICD-10-CM | POA: Diagnosis not present

## 2016-06-18 ENCOUNTER — Telehealth: Payer: Self-pay | Admitting: *Deleted

## 2016-06-18 NOTE — Telephone Encounter (Signed)
noted 

## 2016-06-18 NOTE — Telephone Encounter (Signed)
Received call from [place], [facility] SN with [facility].   States that patient has returned home from rehab and is being followed by [facility] and HHPT. SN is seeing for medication management and PT will eval on 06/19/2016.  MD to be made aware.

## 2016-06-19 DIAGNOSIS — Z7984 Long term (current) use of oral hypoglycemic drugs: Secondary | ICD-10-CM | POA: Diagnosis not present

## 2016-06-19 DIAGNOSIS — R531 Weakness: Secondary | ICD-10-CM | POA: Diagnosis not present

## 2016-06-19 DIAGNOSIS — E119 Type 2 diabetes mellitus without complications: Secondary | ICD-10-CM | POA: Diagnosis not present

## 2016-06-19 DIAGNOSIS — I10 Essential (primary) hypertension: Secondary | ICD-10-CM | POA: Diagnosis not present

## 2016-06-19 DIAGNOSIS — Z87440 Personal history of urinary (tract) infections: Secondary | ICD-10-CM | POA: Diagnosis not present

## 2016-06-19 DIAGNOSIS — R278 Other lack of coordination: Secondary | ICD-10-CM | POA: Diagnosis not present

## 2016-06-19 DIAGNOSIS — Z9181 History of falling: Secondary | ICD-10-CM | POA: Diagnosis not present

## 2016-06-19 DIAGNOSIS — M5136 Other intervertebral disc degeneration, lumbar region: Secondary | ICD-10-CM | POA: Diagnosis not present

## 2016-06-19 DIAGNOSIS — Z791 Long term (current) use of non-steroidal anti-inflammatories (NSAID): Secondary | ICD-10-CM | POA: Diagnosis not present

## 2016-06-19 DIAGNOSIS — R2689 Other abnormalities of gait and mobility: Secondary | ICD-10-CM | POA: Diagnosis not present

## 2016-06-19 DIAGNOSIS — R600 Localized edema: Secondary | ICD-10-CM | POA: Diagnosis not present

## 2016-06-19 DIAGNOSIS — Z7902 Long term (current) use of antithrombotics/antiplatelets: Secondary | ICD-10-CM | POA: Diagnosis not present

## 2016-06-20 ENCOUNTER — Telehealth: Payer: Self-pay | Admitting: *Deleted

## 2016-06-20 DIAGNOSIS — R600 Localized edema: Secondary | ICD-10-CM | POA: Diagnosis not present

## 2016-06-20 DIAGNOSIS — R2689 Other abnormalities of gait and mobility: Secondary | ICD-10-CM | POA: Diagnosis not present

## 2016-06-20 DIAGNOSIS — Z9181 History of falling: Secondary | ICD-10-CM | POA: Diagnosis not present

## 2016-06-20 DIAGNOSIS — Z791 Long term (current) use of non-steroidal anti-inflammatories (NSAID): Secondary | ICD-10-CM | POA: Diagnosis not present

## 2016-06-20 DIAGNOSIS — Z87440 Personal history of urinary (tract) infections: Secondary | ICD-10-CM | POA: Diagnosis not present

## 2016-06-20 DIAGNOSIS — E119 Type 2 diabetes mellitus without complications: Secondary | ICD-10-CM | POA: Diagnosis not present

## 2016-06-20 DIAGNOSIS — M5136 Other intervertebral disc degeneration, lumbar region: Secondary | ICD-10-CM | POA: Diagnosis not present

## 2016-06-20 DIAGNOSIS — I10 Essential (primary) hypertension: Secondary | ICD-10-CM | POA: Diagnosis not present

## 2016-06-20 DIAGNOSIS — R531 Weakness: Secondary | ICD-10-CM | POA: Diagnosis not present

## 2016-06-20 DIAGNOSIS — R278 Other lack of coordination: Secondary | ICD-10-CM | POA: Diagnosis not present

## 2016-06-20 DIAGNOSIS — Z7902 Long term (current) use of antithrombotics/antiplatelets: Secondary | ICD-10-CM | POA: Diagnosis not present

## 2016-06-20 DIAGNOSIS — Z7984 Long term (current) use of oral hypoglycemic drugs: Secondary | ICD-10-CM | POA: Diagnosis not present

## 2016-06-20 NOTE — Telephone Encounter (Signed)
Agree with above 

## 2016-06-20 NOTE — Telephone Encounter (Signed)
Received call from [place], [facility] PT with [facility].    Requested orders to extend PT services 2x weekly 4 weeks for strengthening, balance and gait.   VO given.

## 2016-06-23 ENCOUNTER — Ambulatory Visit (INDEPENDENT_AMBULATORY_CARE_PROVIDER_SITE_OTHER): Payer: Medicare Other | Admitting: Family Medicine

## 2016-06-23 ENCOUNTER — Telehealth: Payer: Self-pay | Admitting: *Deleted

## 2016-06-23 ENCOUNTER — Encounter: Payer: Self-pay | Admitting: Family Medicine

## 2016-06-23 VITALS — BP 122/76 | HR 80 | Temp 98.4°F | Resp 14 | Ht 62.0 in | Wt 271.0 lb

## 2016-06-23 DIAGNOSIS — R269 Unspecified abnormalities of gait and mobility: Secondary | ICD-10-CM | POA: Diagnosis not present

## 2016-06-23 DIAGNOSIS — M5137 Other intervertebral disc degeneration, lumbosacral region: Secondary | ICD-10-CM

## 2016-06-23 DIAGNOSIS — I10 Essential (primary) hypertension: Secondary | ICD-10-CM | POA: Diagnosis not present

## 2016-06-23 DIAGNOSIS — Z9181 History of falling: Secondary | ICD-10-CM | POA: Diagnosis not present

## 2016-06-23 DIAGNOSIS — Z791 Long term (current) use of non-steroidal anti-inflammatories (NSAID): Secondary | ICD-10-CM | POA: Diagnosis not present

## 2016-06-23 DIAGNOSIS — Z7902 Long term (current) use of antithrombotics/antiplatelets: Secondary | ICD-10-CM | POA: Diagnosis not present

## 2016-06-23 DIAGNOSIS — R278 Other lack of coordination: Secondary | ICD-10-CM | POA: Diagnosis not present

## 2016-06-23 DIAGNOSIS — Z87440 Personal history of urinary (tract) infections: Secondary | ICD-10-CM | POA: Diagnosis not present

## 2016-06-23 DIAGNOSIS — R609 Edema, unspecified: Secondary | ICD-10-CM | POA: Diagnosis not present

## 2016-06-23 DIAGNOSIS — R2689 Other abnormalities of gait and mobility: Secondary | ICD-10-CM | POA: Diagnosis not present

## 2016-06-23 DIAGNOSIS — E119 Type 2 diabetes mellitus without complications: Secondary | ICD-10-CM

## 2016-06-23 DIAGNOSIS — R600 Localized edema: Secondary | ICD-10-CM | POA: Diagnosis not present

## 2016-06-23 DIAGNOSIS — Z7984 Long term (current) use of oral hypoglycemic drugs: Secondary | ICD-10-CM | POA: Diagnosis not present

## 2016-06-23 DIAGNOSIS — M5136 Other intervertebral disc degeneration, lumbar region: Secondary | ICD-10-CM | POA: Diagnosis not present

## 2016-06-23 DIAGNOSIS — R531 Weakness: Secondary | ICD-10-CM | POA: Diagnosis not present

## 2016-06-23 DIAGNOSIS — F0150 Vascular dementia without behavioral disturbance: Secondary | ICD-10-CM

## 2016-06-23 LAB — CBC WITH DIFFERENTIAL/PLATELET
BASOS ABS: 0 {cells}/uL (ref 0–200)
Basophils Relative: 0 %
EOS PCT: 1 %
Eosinophils Absolute: 73 cells/uL (ref 15–500)
HCT: 37.2 % (ref 35.0–45.0)
Hemoglobin: 11.8 g/dL — ABNORMAL LOW (ref 12.0–15.0)
LYMPHS PCT: 15 %
Lymphs Abs: 1095 cells/uL (ref 850–3900)
MCH: 29.9 pg (ref 27.0–33.0)
MCHC: 31.7 g/dL — AB (ref 32.0–36.0)
MCV: 94.2 fL (ref 80.0–100.0)
MONO ABS: 438 {cells}/uL (ref 200–950)
MPV: 10.8 fL (ref 7.5–12.5)
Monocytes Relative: 6 %
NEUTROS PCT: 78 %
Neutro Abs: 5694 cells/uL (ref 1500–7800)
Platelets: 219 10*3/uL (ref 140–400)
RBC: 3.95 MIL/uL (ref 3.80–5.10)
RDW: 14.6 % (ref 11.0–15.0)
WBC: 7.3 10*3/uL (ref 3.8–10.8)

## 2016-06-23 LAB — COMPREHENSIVE METABOLIC PANEL
ALT: 13 U/L (ref 6–29)
AST: 15 U/L (ref 10–35)
Albumin: 3.5 g/dL — ABNORMAL LOW (ref 3.6–5.1)
Alkaline Phosphatase: 67 U/L (ref 33–130)
BILIRUBIN TOTAL: 0.7 mg/dL (ref 0.2–1.2)
BUN: 20 mg/dL (ref 7–25)
CALCIUM: 8.9 mg/dL (ref 8.6–10.4)
CHLORIDE: 109 mmol/L (ref 98–110)
CO2: 24 mmol/L (ref 20–31)
Creat: 0.97 mg/dL — ABNORMAL HIGH (ref 0.60–0.88)
GLUCOSE: 142 mg/dL — AB (ref 70–99)
Potassium: 4.6 mmol/L (ref 3.5–5.3)
Sodium: 141 mmol/L (ref 135–146)
Total Protein: 6.2 g/dL (ref 6.1–8.1)

## 2016-06-23 LAB — HEMOGLOBIN A1C
Hgb A1c MFr Bld: 6 % — ABNORMAL HIGH (ref <5.7)
MEAN PLASMA GLUCOSE: 126 mg/dL

## 2016-06-23 NOTE — Progress Notes (Signed)
   Subjective:    Patient ID: Christine Zavala, female    DOB: 1933-06-28, 83 y.o.   MRN: [id number]  Patient presents for Hospital/Rehab F/U and Face to Face (walker)      Pt here for hospital follow up, admitted 1 month ago, secondary to gneralized weakness and UTI ,sent to SNF - [facility].  She was found to be profoundly weak, slid down to floor had some slurred speech. Stroke work up was negative, all symptoms though to be secondary to UTI and dehydration.   E coli found in UTI, treated with Ceftin  Reports of diarrhea, however no diarrheal stools noted during admission Cr was elevated to 1.2 which was about her baseline, cr was 1.0  At discharge   DM- last A1C 6.2%, no difficulty with cbg during admission , she is not been checking her CBGs lately but has been taking her medication.  Started HHPT today, will come twice a week , needs a walking -Rollater  ,shower chair , trying to get ramp put IN. She still having some weakness in her left leg. She did have an MRI of her lumbar spine which was ordered by the skilled nursing facility physician this showed the known degenerative disc disease but no nerve impingement.   Review Of Systems:  GEN- denies fatigue, fever, weight loss,weakness, recent illness HEENT- denies eye drainage, change in vision, nasal discharge, CVS- denies chest pain, palpitations RESP- denies SOB, cough, wheeze ABD- denies N/V, change in stools, abd pain GU- denies dysuria, hematuria, dribbling, incontinence MSK- + joint pain, muscle aches, injury Neuro- denies headache, dizziness, syncope, seizure activity       Objective:    BP 122/76 (BP Location: Left Arm, Patient Position: Sitting, Cuff Size: Large)   Pulse 80   Temp 98.4 F (36.9 C) (Oral)   Resp 14   Ht 5\' 2"  (1.575 m)   Wt 271 lb (122.9 kg)   SpO2 98%   BMI 49.57 kg/m  GEN- NAD, alert and oriented x3,obese unstable gait walks with cane  HEENT- PERRL, EOMI, non injected sclera, pink conjunctiva,  MMM, oropharynx clear CVS- RRR, no murmur RESP-CTAB ABD-NABS,soft,NT,ND EXT-TRACE Ankle  Edema bilat  MSK- Decreased ROM Spine, HIPS,  KNEES  Pulses- Radial, DP- 2+      Assessment & Plan:      Problem List Items Addressed This Visit    Peripheral edema    Her edema is at baseline but they needed a new pair of Compression  prescription written      Hypertension - Primary    Blood pressure controlled education medication      Relevant Orders   CBC with Differential/Platelet   Comprehensive metabolic panel   Diabetes mellitus without complication ([place])    Diabetes has been well controlled. We'll check her A1c as well as her renal function today. She appears to be back to her baseline otherwise      Relevant Orders   Hemoglobin A1c   Dementia   DDD (degenerative disc disease), lumbosacral    She would benefit from further assistance for her gait I prescribed the Rollator as well as a shower chair and handle to help prevent falls at home      Abnormality of gait      Note: This dictation was prepared with Dragon dictation along with smaller phrase technology. Any transcriptional errors that result from this process are unintentional.

## 2016-06-23 NOTE — Assessment & Plan Note (Signed)
Her edema is at baseline but they needed a new pair of Compression  prescription written

## 2016-06-23 NOTE — Assessment & Plan Note (Signed)
She would benefit from further assistance for her gait I prescribed the Rollator as well as a shower chair and handle to help prevent falls at home

## 2016-06-23 NOTE — Assessment & Plan Note (Signed)
Blood pressure controlled education medication

## 2016-06-23 NOTE — Telephone Encounter (Signed)
Received call from patient daughter.   Reports that patient requires order that states "Bariatric shower chair/ bench" with face to face notes to be faxed to [facility].   Prescription to be faxed.

## 2016-06-23 NOTE — Assessment & Plan Note (Signed)
Diabetes has been well controlled. We'll check her A1c as well as her renal function today. She appears to be back to her baseline otherwise

## 2016-06-23 NOTE — Patient Instructions (Signed)
Change f/u to April

## 2016-06-24 DIAGNOSIS — M5136 Other intervertebral disc degeneration, lumbar region: Secondary | ICD-10-CM | POA: Diagnosis not present

## 2016-06-24 DIAGNOSIS — Z87440 Personal history of urinary (tract) infections: Secondary | ICD-10-CM | POA: Diagnosis not present

## 2016-06-24 DIAGNOSIS — I10 Essential (primary) hypertension: Secondary | ICD-10-CM | POA: Diagnosis not present

## 2016-06-24 DIAGNOSIS — R278 Other lack of coordination: Secondary | ICD-10-CM | POA: Diagnosis not present

## 2016-06-24 DIAGNOSIS — Z9181 History of falling: Secondary | ICD-10-CM | POA: Diagnosis not present

## 2016-06-24 DIAGNOSIS — R600 Localized edema: Secondary | ICD-10-CM | POA: Diagnosis not present

## 2016-06-24 DIAGNOSIS — R2689 Other abnormalities of gait and mobility: Secondary | ICD-10-CM | POA: Diagnosis not present

## 2016-06-24 DIAGNOSIS — E119 Type 2 diabetes mellitus without complications: Secondary | ICD-10-CM | POA: Diagnosis not present

## 2016-06-24 DIAGNOSIS — Z791 Long term (current) use of non-steroidal anti-inflammatories (NSAID): Secondary | ICD-10-CM | POA: Diagnosis not present

## 2016-06-24 DIAGNOSIS — R531 Weakness: Secondary | ICD-10-CM | POA: Diagnosis not present

## 2016-06-24 DIAGNOSIS — Z7902 Long term (current) use of antithrombotics/antiplatelets: Secondary | ICD-10-CM | POA: Diagnosis not present

## 2016-06-24 DIAGNOSIS — Z7984 Long term (current) use of oral hypoglycemic drugs: Secondary | ICD-10-CM | POA: Diagnosis not present

## 2016-06-24 NOTE — Telephone Encounter (Signed)
done

## 2016-06-25 DIAGNOSIS — M5136 Other intervertebral disc degeneration, lumbar region: Secondary | ICD-10-CM | POA: Diagnosis not present

## 2016-06-25 DIAGNOSIS — Z7902 Long term (current) use of antithrombotics/antiplatelets: Secondary | ICD-10-CM | POA: Diagnosis not present

## 2016-06-25 DIAGNOSIS — R278 Other lack of coordination: Secondary | ICD-10-CM | POA: Diagnosis not present

## 2016-06-25 DIAGNOSIS — R2689 Other abnormalities of gait and mobility: Secondary | ICD-10-CM | POA: Diagnosis not present

## 2016-06-25 DIAGNOSIS — R600 Localized edema: Secondary | ICD-10-CM | POA: Diagnosis not present

## 2016-06-25 DIAGNOSIS — Z9181 History of falling: Secondary | ICD-10-CM | POA: Diagnosis not present

## 2016-06-25 DIAGNOSIS — I10 Essential (primary) hypertension: Secondary | ICD-10-CM | POA: Diagnosis not present

## 2016-06-25 DIAGNOSIS — Z791 Long term (current) use of non-steroidal anti-inflammatories (NSAID): Secondary | ICD-10-CM | POA: Diagnosis not present

## 2016-06-25 DIAGNOSIS — Z87440 Personal history of urinary (tract) infections: Secondary | ICD-10-CM | POA: Diagnosis not present

## 2016-06-25 DIAGNOSIS — E119 Type 2 diabetes mellitus without complications: Secondary | ICD-10-CM | POA: Diagnosis not present

## 2016-06-25 DIAGNOSIS — R531 Weakness: Secondary | ICD-10-CM | POA: Diagnosis not present

## 2016-06-25 DIAGNOSIS — Z7984 Long term (current) use of oral hypoglycemic drugs: Secondary | ICD-10-CM | POA: Diagnosis not present

## 2016-06-26 DIAGNOSIS — R278 Other lack of coordination: Secondary | ICD-10-CM | POA: Diagnosis not present

## 2016-06-26 DIAGNOSIS — R531 Weakness: Secondary | ICD-10-CM | POA: Diagnosis not present

## 2016-06-26 DIAGNOSIS — Z7984 Long term (current) use of oral hypoglycemic drugs: Secondary | ICD-10-CM | POA: Diagnosis not present

## 2016-06-26 DIAGNOSIS — Z791 Long term (current) use of non-steroidal anti-inflammatories (NSAID): Secondary | ICD-10-CM | POA: Diagnosis not present

## 2016-06-26 DIAGNOSIS — Z9181 History of falling: Secondary | ICD-10-CM | POA: Diagnosis not present

## 2016-06-26 DIAGNOSIS — Z7902 Long term (current) use of antithrombotics/antiplatelets: Secondary | ICD-10-CM | POA: Diagnosis not present

## 2016-06-26 DIAGNOSIS — E119 Type 2 diabetes mellitus without complications: Secondary | ICD-10-CM | POA: Diagnosis not present

## 2016-06-26 DIAGNOSIS — Z87440 Personal history of urinary (tract) infections: Secondary | ICD-10-CM | POA: Diagnosis not present

## 2016-06-26 DIAGNOSIS — R2689 Other abnormalities of gait and mobility: Secondary | ICD-10-CM | POA: Diagnosis not present

## 2016-06-26 DIAGNOSIS — M5136 Other intervertebral disc degeneration, lumbar region: Secondary | ICD-10-CM | POA: Diagnosis not present

## 2016-06-26 DIAGNOSIS — R600 Localized edema: Secondary | ICD-10-CM | POA: Diagnosis not present

## 2016-06-26 DIAGNOSIS — I10 Essential (primary) hypertension: Secondary | ICD-10-CM | POA: Diagnosis not present

## 2016-06-27 DIAGNOSIS — M1990 Unspecified osteoarthritis, unspecified site: Secondary | ICD-10-CM | POA: Diagnosis not present

## 2016-06-27 DIAGNOSIS — R269 Unspecified abnormalities of gait and mobility: Secondary | ICD-10-CM | POA: Diagnosis not present

## 2016-06-27 DIAGNOSIS — M2550 Pain in unspecified joint: Secondary | ICD-10-CM | POA: Diagnosis not present

## 2016-06-30 DIAGNOSIS — R600 Localized edema: Secondary | ICD-10-CM | POA: Diagnosis not present

## 2016-06-30 DIAGNOSIS — Z791 Long term (current) use of non-steroidal anti-inflammatories (NSAID): Secondary | ICD-10-CM | POA: Diagnosis not present

## 2016-06-30 DIAGNOSIS — R531 Weakness: Secondary | ICD-10-CM | POA: Diagnosis not present

## 2016-06-30 DIAGNOSIS — R278 Other lack of coordination: Secondary | ICD-10-CM | POA: Diagnosis not present

## 2016-06-30 DIAGNOSIS — Z87440 Personal history of urinary (tract) infections: Secondary | ICD-10-CM | POA: Diagnosis not present

## 2016-06-30 DIAGNOSIS — E119 Type 2 diabetes mellitus without complications: Secondary | ICD-10-CM | POA: Diagnosis not present

## 2016-06-30 DIAGNOSIS — Z9181 History of falling: Secondary | ICD-10-CM | POA: Diagnosis not present

## 2016-06-30 DIAGNOSIS — R2689 Other abnormalities of gait and mobility: Secondary | ICD-10-CM | POA: Diagnosis not present

## 2016-06-30 DIAGNOSIS — Z7902 Long term (current) use of antithrombotics/antiplatelets: Secondary | ICD-10-CM | POA: Diagnosis not present

## 2016-06-30 DIAGNOSIS — Z7984 Long term (current) use of oral hypoglycemic drugs: Secondary | ICD-10-CM | POA: Diagnosis not present

## 2016-06-30 DIAGNOSIS — I10 Essential (primary) hypertension: Secondary | ICD-10-CM | POA: Diagnosis not present

## 2016-06-30 DIAGNOSIS — M5136 Other intervertebral disc degeneration, lumbar region: Secondary | ICD-10-CM | POA: Diagnosis not present

## 2016-07-02 DIAGNOSIS — Z7984 Long term (current) use of oral hypoglycemic drugs: Secondary | ICD-10-CM | POA: Diagnosis not present

## 2016-07-02 DIAGNOSIS — M5136 Other intervertebral disc degeneration, lumbar region: Secondary | ICD-10-CM | POA: Diagnosis not present

## 2016-07-02 DIAGNOSIS — Z87440 Personal history of urinary (tract) infections: Secondary | ICD-10-CM | POA: Diagnosis not present

## 2016-07-02 DIAGNOSIS — Z7902 Long term (current) use of antithrombotics/antiplatelets: Secondary | ICD-10-CM | POA: Diagnosis not present

## 2016-07-02 DIAGNOSIS — Z791 Long term (current) use of non-steroidal anti-inflammatories (NSAID): Secondary | ICD-10-CM | POA: Diagnosis not present

## 2016-07-02 DIAGNOSIS — E119 Type 2 diabetes mellitus without complications: Secondary | ICD-10-CM | POA: Diagnosis not present

## 2016-07-02 DIAGNOSIS — Z9181 History of falling: Secondary | ICD-10-CM | POA: Diagnosis not present

## 2016-07-02 DIAGNOSIS — I10 Essential (primary) hypertension: Secondary | ICD-10-CM | POA: Diagnosis not present

## 2016-07-02 DIAGNOSIS — R600 Localized edema: Secondary | ICD-10-CM | POA: Diagnosis not present

## 2016-07-02 DIAGNOSIS — R531 Weakness: Secondary | ICD-10-CM | POA: Diagnosis not present

## 2016-07-02 DIAGNOSIS — R278 Other lack of coordination: Secondary | ICD-10-CM | POA: Diagnosis not present

## 2016-07-02 DIAGNOSIS — R2689 Other abnormalities of gait and mobility: Secondary | ICD-10-CM | POA: Diagnosis not present

## 2016-07-03 DIAGNOSIS — Z9181 History of falling: Secondary | ICD-10-CM | POA: Diagnosis not present

## 2016-07-03 DIAGNOSIS — E119 Type 2 diabetes mellitus without complications: Secondary | ICD-10-CM | POA: Diagnosis not present

## 2016-07-03 DIAGNOSIS — Z791 Long term (current) use of non-steroidal anti-inflammatories (NSAID): Secondary | ICD-10-CM | POA: Diagnosis not present

## 2016-07-03 DIAGNOSIS — Z7902 Long term (current) use of antithrombotics/antiplatelets: Secondary | ICD-10-CM | POA: Diagnosis not present

## 2016-07-03 DIAGNOSIS — R600 Localized edema: Secondary | ICD-10-CM | POA: Diagnosis not present

## 2016-07-03 DIAGNOSIS — M5136 Other intervertebral disc degeneration, lumbar region: Secondary | ICD-10-CM | POA: Diagnosis not present

## 2016-07-03 DIAGNOSIS — Z7984 Long term (current) use of oral hypoglycemic drugs: Secondary | ICD-10-CM | POA: Diagnosis not present

## 2016-07-03 DIAGNOSIS — R2689 Other abnormalities of gait and mobility: Secondary | ICD-10-CM | POA: Diagnosis not present

## 2016-07-03 DIAGNOSIS — R531 Weakness: Secondary | ICD-10-CM | POA: Diagnosis not present

## 2016-07-03 DIAGNOSIS — I10 Essential (primary) hypertension: Secondary | ICD-10-CM | POA: Diagnosis not present

## 2016-07-03 DIAGNOSIS — R278 Other lack of coordination: Secondary | ICD-10-CM | POA: Diagnosis not present

## 2016-07-03 DIAGNOSIS — Z87440 Personal history of urinary (tract) infections: Secondary | ICD-10-CM | POA: Diagnosis not present

## 2016-07-07 DIAGNOSIS — R531 Weakness: Secondary | ICD-10-CM | POA: Diagnosis not present

## 2016-07-07 DIAGNOSIS — Z9181 History of falling: Secondary | ICD-10-CM | POA: Diagnosis not present

## 2016-07-07 DIAGNOSIS — R2689 Other abnormalities of gait and mobility: Secondary | ICD-10-CM | POA: Diagnosis not present

## 2016-07-07 DIAGNOSIS — M5136 Other intervertebral disc degeneration, lumbar region: Secondary | ICD-10-CM | POA: Diagnosis not present

## 2016-07-07 DIAGNOSIS — I10 Essential (primary) hypertension: Secondary | ICD-10-CM | POA: Diagnosis not present

## 2016-07-07 DIAGNOSIS — R600 Localized edema: Secondary | ICD-10-CM | POA: Diagnosis not present

## 2016-07-07 DIAGNOSIS — Z7984 Long term (current) use of oral hypoglycemic drugs: Secondary | ICD-10-CM | POA: Diagnosis not present

## 2016-07-07 DIAGNOSIS — Z791 Long term (current) use of non-steroidal anti-inflammatories (NSAID): Secondary | ICD-10-CM | POA: Diagnosis not present

## 2016-07-07 DIAGNOSIS — Z7902 Long term (current) use of antithrombotics/antiplatelets: Secondary | ICD-10-CM | POA: Diagnosis not present

## 2016-07-07 DIAGNOSIS — E119 Type 2 diabetes mellitus without complications: Secondary | ICD-10-CM | POA: Diagnosis not present

## 2016-07-07 DIAGNOSIS — R278 Other lack of coordination: Secondary | ICD-10-CM | POA: Diagnosis not present

## 2016-07-07 DIAGNOSIS — Z87440 Personal history of urinary (tract) infections: Secondary | ICD-10-CM | POA: Diagnosis not present

## 2016-07-09 DIAGNOSIS — Z7984 Long term (current) use of oral hypoglycemic drugs: Secondary | ICD-10-CM | POA: Diagnosis not present

## 2016-07-09 DIAGNOSIS — Z87440 Personal history of urinary (tract) infections: Secondary | ICD-10-CM | POA: Diagnosis not present

## 2016-07-09 DIAGNOSIS — E119 Type 2 diabetes mellitus without complications: Secondary | ICD-10-CM | POA: Diagnosis not present

## 2016-07-09 DIAGNOSIS — R2689 Other abnormalities of gait and mobility: Secondary | ICD-10-CM | POA: Diagnosis not present

## 2016-07-09 DIAGNOSIS — Z7902 Long term (current) use of antithrombotics/antiplatelets: Secondary | ICD-10-CM | POA: Diagnosis not present

## 2016-07-09 DIAGNOSIS — Z791 Long term (current) use of non-steroidal anti-inflammatories (NSAID): Secondary | ICD-10-CM | POA: Diagnosis not present

## 2016-07-09 DIAGNOSIS — R531 Weakness: Secondary | ICD-10-CM | POA: Diagnosis not present

## 2016-07-09 DIAGNOSIS — I10 Essential (primary) hypertension: Secondary | ICD-10-CM | POA: Diagnosis not present

## 2016-07-09 DIAGNOSIS — M5136 Other intervertebral disc degeneration, lumbar region: Secondary | ICD-10-CM | POA: Diagnosis not present

## 2016-07-09 DIAGNOSIS — R278 Other lack of coordination: Secondary | ICD-10-CM | POA: Diagnosis not present

## 2016-07-09 DIAGNOSIS — Z9181 History of falling: Secondary | ICD-10-CM | POA: Diagnosis not present

## 2016-07-09 DIAGNOSIS — R600 Localized edema: Secondary | ICD-10-CM | POA: Diagnosis not present

## 2016-07-10 DIAGNOSIS — Z791 Long term (current) use of non-steroidal anti-inflammatories (NSAID): Secondary | ICD-10-CM | POA: Diagnosis not present

## 2016-07-10 DIAGNOSIS — I10 Essential (primary) hypertension: Secondary | ICD-10-CM | POA: Diagnosis not present

## 2016-07-10 DIAGNOSIS — Z87440 Personal history of urinary (tract) infections: Secondary | ICD-10-CM | POA: Diagnosis not present

## 2016-07-10 DIAGNOSIS — R531 Weakness: Secondary | ICD-10-CM | POA: Diagnosis not present

## 2016-07-10 DIAGNOSIS — M5136 Other intervertebral disc degeneration, lumbar region: Secondary | ICD-10-CM | POA: Diagnosis not present

## 2016-07-10 DIAGNOSIS — R278 Other lack of coordination: Secondary | ICD-10-CM | POA: Diagnosis not present

## 2016-07-10 DIAGNOSIS — R2689 Other abnormalities of gait and mobility: Secondary | ICD-10-CM | POA: Diagnosis not present

## 2016-07-10 DIAGNOSIS — Z7902 Long term (current) use of antithrombotics/antiplatelets: Secondary | ICD-10-CM | POA: Diagnosis not present

## 2016-07-10 DIAGNOSIS — E119 Type 2 diabetes mellitus without complications: Secondary | ICD-10-CM | POA: Diagnosis not present

## 2016-07-10 DIAGNOSIS — R600 Localized edema: Secondary | ICD-10-CM | POA: Diagnosis not present

## 2016-07-10 DIAGNOSIS — Z9181 History of falling: Secondary | ICD-10-CM | POA: Diagnosis not present

## 2016-07-10 DIAGNOSIS — Z7984 Long term (current) use of oral hypoglycemic drugs: Secondary | ICD-10-CM | POA: Diagnosis not present

## 2016-07-15 ENCOUNTER — Ambulatory Visit: Payer: Medicare Other | Admitting: Family Medicine

## 2016-07-17 DIAGNOSIS — I10 Essential (primary) hypertension: Secondary | ICD-10-CM | POA: Diagnosis not present

## 2016-07-17 DIAGNOSIS — Z87440 Personal history of urinary (tract) infections: Secondary | ICD-10-CM | POA: Diagnosis not present

## 2016-07-17 DIAGNOSIS — E119 Type 2 diabetes mellitus without complications: Secondary | ICD-10-CM | POA: Diagnosis not present

## 2016-07-17 DIAGNOSIS — Z791 Long term (current) use of non-steroidal anti-inflammatories (NSAID): Secondary | ICD-10-CM | POA: Diagnosis not present

## 2016-07-17 DIAGNOSIS — M5136 Other intervertebral disc degeneration, lumbar region: Secondary | ICD-10-CM | POA: Diagnosis not present

## 2016-07-17 DIAGNOSIS — Z9181 History of falling: Secondary | ICD-10-CM | POA: Diagnosis not present

## 2016-07-17 DIAGNOSIS — R600 Localized edema: Secondary | ICD-10-CM | POA: Diagnosis not present

## 2016-07-17 DIAGNOSIS — R531 Weakness: Secondary | ICD-10-CM | POA: Diagnosis not present

## 2016-07-17 DIAGNOSIS — R278 Other lack of coordination: Secondary | ICD-10-CM | POA: Diagnosis not present

## 2016-07-17 DIAGNOSIS — Z7984 Long term (current) use of oral hypoglycemic drugs: Secondary | ICD-10-CM | POA: Diagnosis not present

## 2016-07-17 DIAGNOSIS — Z7902 Long term (current) use of antithrombotics/antiplatelets: Secondary | ICD-10-CM | POA: Diagnosis not present

## 2016-07-17 DIAGNOSIS — R2689 Other abnormalities of gait and mobility: Secondary | ICD-10-CM | POA: Diagnosis not present

## 2016-07-18 DIAGNOSIS — Z791 Long term (current) use of non-steroidal anti-inflammatories (NSAID): Secondary | ICD-10-CM | POA: Diagnosis not present

## 2016-07-18 DIAGNOSIS — E119 Type 2 diabetes mellitus without complications: Secondary | ICD-10-CM | POA: Diagnosis not present

## 2016-07-18 DIAGNOSIS — Z7902 Long term (current) use of antithrombotics/antiplatelets: Secondary | ICD-10-CM | POA: Diagnosis not present

## 2016-07-18 DIAGNOSIS — I10 Essential (primary) hypertension: Secondary | ICD-10-CM | POA: Diagnosis not present

## 2016-07-18 DIAGNOSIS — R531 Weakness: Secondary | ICD-10-CM | POA: Diagnosis not present

## 2016-07-18 DIAGNOSIS — R278 Other lack of coordination: Secondary | ICD-10-CM | POA: Diagnosis not present

## 2016-07-18 DIAGNOSIS — R2689 Other abnormalities of gait and mobility: Secondary | ICD-10-CM | POA: Diagnosis not present

## 2016-07-18 DIAGNOSIS — M5136 Other intervertebral disc degeneration, lumbar region: Secondary | ICD-10-CM | POA: Diagnosis not present

## 2016-07-18 DIAGNOSIS — Z9181 History of falling: Secondary | ICD-10-CM | POA: Diagnosis not present

## 2016-07-18 DIAGNOSIS — Z7984 Long term (current) use of oral hypoglycemic drugs: Secondary | ICD-10-CM | POA: Diagnosis not present

## 2016-07-18 DIAGNOSIS — Z87440 Personal history of urinary (tract) infections: Secondary | ICD-10-CM | POA: Diagnosis not present

## 2016-07-18 DIAGNOSIS — R600 Localized edema: Secondary | ICD-10-CM | POA: Diagnosis not present

## 2016-07-31 ENCOUNTER — Other Ambulatory Visit: Payer: Self-pay | Admitting: Family Medicine

## 2016-08-25 ENCOUNTER — Encounter: Payer: Self-pay | Admitting: Family Medicine

## 2016-08-25 ENCOUNTER — Ambulatory Visit (INDEPENDENT_AMBULATORY_CARE_PROVIDER_SITE_OTHER): Payer: Medicare Other | Admitting: Family Medicine

## 2016-08-25 ENCOUNTER — Other Ambulatory Visit: Payer: Self-pay | Admitting: *Deleted

## 2016-08-25 VITALS — BP 128/72 | HR 88 | Temp 98.1°F | Resp 14 | Ht 62.0 in | Wt 276.0 lb

## 2016-08-25 DIAGNOSIS — F0150 Vascular dementia without behavioral disturbance: Secondary | ICD-10-CM

## 2016-08-25 DIAGNOSIS — R300 Dysuria: Secondary | ICD-10-CM | POA: Diagnosis not present

## 2016-08-25 DIAGNOSIS — R609 Edema, unspecified: Secondary | ICD-10-CM

## 2016-08-25 DIAGNOSIS — E119 Type 2 diabetes mellitus without complications: Secondary | ICD-10-CM

## 2016-08-25 LAB — URINALYSIS, ROUTINE W REFLEX MICROSCOPIC
Bilirubin Urine: NEGATIVE
Glucose, UA: NEGATIVE
HGB URINE DIPSTICK: NEGATIVE
KETONES UR: NEGATIVE
NITRITE: POSITIVE — AB
Specific Gravity, Urine: 1.015 (ref 1.001–1.035)
pH: 7.5 (ref 5.0–8.0)

## 2016-08-25 LAB — URINALYSIS, MICROSCOPIC ONLY
CASTS: NONE SEEN [LPF]
Crystals: NONE SEEN [HPF]
Yeast: NONE SEEN [HPF]

## 2016-08-25 LAB — HM DIABETES EYE EXAM

## 2016-08-25 MED ORDER — CEPHALEXIN 500 MG PO CAPS: 500.0000 mg | ORAL_CAPSULE | Freq: Two times a day (BID) | ORAL | 0 refills | Status: DC

## 2016-08-25 MED ORDER — METFORMIN HCL 500 MG PO TABS: 500.0000 mg | ORAL_TABLET | Freq: Every day | ORAL | 2 refills | Status: DC

## 2016-08-25 MED ORDER — FUROSEMIDE 40 MG PO TABS: 40.0000 mg | ORAL_TABLET | Freq: Every day | ORAL | 1 refills | Status: DC | PRN

## 2016-08-25 NOTE — Assessment & Plan Note (Signed)
She will take Lasix for the next week and she is to get her compression hose. Discussed importance of low sodium diet and elevating her legs.

## 2016-08-25 NOTE — Assessment & Plan Note (Signed)
Mild cognitive decline at this time no medications needed her children are caring for her.  We'll send urine off to see a true UTI sounds like more irritation and she does have urinary incontinence and is using the topical barrier cream which she should continue.

## 2016-08-25 NOTE — Progress Notes (Signed)
   Subjective:    Patient ID: Christine Zavala, female    DOB: February 28, 1934, 83 y.o.   MRN: [id number]  Patient presents for 4 month F/U (is fasting)    She had a follow-up chronic medical problems. His been doing fairly well at home since her last hospitalization. Here today with her daughter      Diabetes mellitus- Last A1C 6% -CBG  This AM 135, taking Metformin once a day   No falls since last visit, using Rollator  Legs swelling for past week, admits to eating a lot of fried foods and salty foods, but they are tight and causing some pain, weight up 5lbs since Jan . Denies difficulty breathing . She never picked up the compression hose   End of visit statse she has some mild burning and itching near urethra, was give what sounds like Butt paste from [facility] and this helped, but wants to be checked for UTI   Review Of Systems:  GEN- denies fatigue, fever, weight loss,weakness, recent illness HEENT- denies eye drainage, change in vision, nasal discharge, CVS- denies chest pain, palpitations RESP- denies SOB, cough, wheeze ABD- denies N/V, change in stools, abd pain GU-+dysuria,  Denies hematuria, dribbling, incontinence MSK- denies joint pain, muscle aches, injury Neuro- denies headache, dizziness, syncope, seizure activity       Objective:    BP 128/72   Pulse 88   Temp 98.1 F (36.7 C) (Oral)   Resp 14   Ht 5\' 2"  (1.575 m)   Wt 276 lb (125.2 kg)   SpO2 98%   BMI 50.48 kg/m  GEN- NAD, alert and oriented x3 HEENT- PERRL, EOMI, non injected sclera, pink conjunctiva, MMM, oropharynx clear Neck- Supple, no thyromegaly CVS- RRR, no murmur RESP-CTAB ABD-NABS,soft,NT,ND EXT- mild pitting edema to mid shings, more non pitting chronic around ankles  Pulses- Radial, DP-palpated         Assessment & Plan:      Problem List Items Addressed This Visit    Peripheral edema - Primary    She will take Lasix for the next week and she is to get her compression hose. Discussed  importance of low sodium diet and elevating her legs.      Diabetes mellitus without complication ([place])    Diabetes has been well-controlled continue with the low-dose metformin      Relevant Medications   metFORMIN (GLUCOPHAGE) 500 MG tablet   Dementia    Mild cognitive decline at this time no medications needed her children are caring for her.  We'll send urine off to see a true UTI sounds like more irritation and she does have urinary incontinence and is using the topical barrier cream which she should continue.       Other Visit Diagnoses    Dysuria       Relevant Orders   Urinalysis, Routine w reflex microscopic   Urine culture      Note: This dictation was prepared with Dragon dictation along with smaller phrase technology. Any transcriptional errors that result from this process are unintentional.

## 2016-08-25 NOTE — Patient Instructions (Addendum)
Take 1 pill (Lasix) in the morning for fluid for 1 week  Continue Metformin 1 pill in the morning  F/U 3 months

## 2016-08-25 NOTE — Assessment & Plan Note (Signed)
Diabetes has been well-controlled continue with the low-dose metformin

## 2016-08-28 LAB — URINE CULTURE

## 2016-09-02 ENCOUNTER — Other Ambulatory Visit: Payer: Self-pay

## 2016-09-02 MED ORDER — CIPROFLOXACIN HCL 500 MG PO TABS
500.0000 mg | ORAL_TABLET | Freq: Two times a day (BID) | ORAL | 0 refills | Status: DC
Start: 2016-09-02 — End: 2016-09-30

## 2016-09-08 ENCOUNTER — Other Ambulatory Visit: Payer: Self-pay | Admitting: Family Medicine

## 2016-09-09 ENCOUNTER — Other Ambulatory Visit: Payer: Self-pay | Admitting: Family Medicine

## 2016-09-30 ENCOUNTER — Other Ambulatory Visit: Payer: Self-pay | Admitting: *Deleted

## 2016-09-30 MED ORDER — FUROSEMIDE 40 MG PO TABS: 40.0000 mg | ORAL_TABLET | Freq: Every day | ORAL | 1 refills | Status: DC | PRN

## 2016-10-07 ENCOUNTER — Other Ambulatory Visit: Payer: Self-pay | Admitting: Family Medicine

## 2016-11-03 ENCOUNTER — Ambulatory Visit (INDEPENDENT_AMBULATORY_CARE_PROVIDER_SITE_OTHER): Payer: Medicare Other | Admitting: Family Medicine

## 2016-11-03 ENCOUNTER — Encounter: Payer: Self-pay | Admitting: Family Medicine

## 2016-11-03 VITALS — BP 134/78 | HR 82 | Temp 98.0°F | Resp 14 | Ht 62.0 in | Wt 280.0 lb

## 2016-11-03 DIAGNOSIS — N390 Urinary tract infection, site not specified: Secondary | ICD-10-CM | POA: Diagnosis not present

## 2016-11-03 DIAGNOSIS — R319 Hematuria, unspecified: Secondary | ICD-10-CM | POA: Diagnosis not present

## 2016-11-03 DIAGNOSIS — R609 Edema, unspecified: Secondary | ICD-10-CM | POA: Diagnosis not present

## 2016-11-03 DIAGNOSIS — R300 Dysuria: Secondary | ICD-10-CM | POA: Diagnosis not present

## 2016-11-03 LAB — URINALYSIS, ROUTINE W REFLEX MICROSCOPIC
Bilirubin Urine: NEGATIVE
Glucose, UA: NEGATIVE
Ketones, ur: NEGATIVE
NITRITE: NEGATIVE
Specific Gravity, Urine: 1.025 (ref 1.001–1.035)
pH: 5.5 (ref 5.0–8.0)

## 2016-11-03 LAB — URINALYSIS, MICROSCOPIC ONLY
Casts: NONE SEEN [LPF]
Crystals: NONE SEEN [HPF]
Yeast: NONE SEEN [HPF]

## 2016-11-03 MED ORDER — FUROSEMIDE 40 MG PO TABS: 40.0000 mg | ORAL_TABLET | Freq: Every day | ORAL | 1 refills | Status: DC | PRN

## 2016-11-03 MED ORDER — CIPROFLOXACIN HCL 250 MG PO TABS
250.0000 mg | ORAL_TABLET | Freq: Two times a day (BID) | ORAL | 0 refills | Status: DC
Start: 2016-11-03 — End: 2016-11-24

## 2016-11-03 NOTE — Progress Notes (Signed)
    Subjective:    Patient ID: Christine Zavala, female    DOB: [birth year], 83 y.o.   MRN: [id number]  Patient presents for Dysuria (burning with urination, increased stress incontinence)   Peripheral Edema- took lasix back in April and fluid resolved. Weight up 4lbs since April. She has had more swelling past few weeks. NO sob   Dysuria- some pressure, increased incontince more tired recently, no fever, no chills   Had UTI with Proteus back in April, took Cipro     PT ALONE today    Review Of Systems:  GEN- denies fatigue, fever, weight loss,weakness, recent illness HEENT- denies eye drainage, change in vision, nasal discharge, CVS- denies chest pain, palpitations RESP- denies SOB, cough, wheeze ABD- denies N/V, change in stools, abd pain GU- +dysuria, hematuria, dribbling, incontinence MSK- denies joint pain, muscle aches, injury Neuro- denies headache, dizziness, syncope, seizure activity       Objective:    BP 134/78   Pulse 82   Temp 98 F (36.7 C) (Oral)   Resp 14   Ht 5\' 2"  (1.575 m)   Wt 280 lb (127 kg)   SpO2 97%   BMI 51.21 kg/m  GEN- NAD, alert and oriented x3 HEENT- PERRL, EOMI, non injected sclera, pink conjunctiva, MMM, oropharynx clear CVS- RRR, no murmur RESP-CTAB ABD-NABS,soft,NT,ND, no CVA tenderness  EXT- pedal edema Pulses- Radial 2+        Assessment & Plan:      Problem List Items Addressed This Visit    Peripheral edema    Restart lasix daily. Wear compression hose, she sits all day       Other Visit Diagnoses    Urinary tract infection with hematuria, site unspecified    -  Primary   Had proteus prior, Cipro given, culture sent   Relevant Orders   Urinalysis, Routine w reflex microscopic (Completed)   Urine Culture      Note: This dictation was prepared with Dragon dictation along with smaller phrase technology. Any transcriptional errors that result from this process are unintentional.

## 2016-11-03 NOTE — Patient Instructions (Addendum)
Take the lasix 40mg  once a day  Take the antibiotics as prescribed F/U as previous

## 2016-11-03 NOTE — Assessment & Plan Note (Signed)
Restart lasix daily. Wear compression hose, she sits all day

## 2016-11-06 LAB — URINE CULTURE

## 2016-11-24 ENCOUNTER — Encounter: Payer: Self-pay | Admitting: Family Medicine

## 2016-11-24 ENCOUNTER — Other Ambulatory Visit: Payer: Self-pay | Admitting: Family Medicine

## 2016-11-24 ENCOUNTER — Ambulatory Visit (INDEPENDENT_AMBULATORY_CARE_PROVIDER_SITE_OTHER): Payer: Medicare Other | Admitting: Family Medicine

## 2016-11-24 VITALS — BP 132/74 | HR 76 | Temp 97.5°F | Resp 14 | Ht 62.0 in | Wt 271.0 lb

## 2016-11-24 DIAGNOSIS — M51369 Other intervertebral disc degeneration, lumbar region without mention of lumbar back pain or lower extremity pain: Secondary | ICD-10-CM

## 2016-11-24 DIAGNOSIS — R609 Edema, unspecified: Secondary | ICD-10-CM | POA: Diagnosis not present

## 2016-11-24 DIAGNOSIS — I10 Essential (primary) hypertension: Secondary | ICD-10-CM | POA: Diagnosis not present

## 2016-11-24 DIAGNOSIS — K5900 Constipation, unspecified: Secondary | ICD-10-CM | POA: Diagnosis not present

## 2016-11-24 DIAGNOSIS — E119 Type 2 diabetes mellitus without complications: Secondary | ICD-10-CM

## 2016-11-24 DIAGNOSIS — M5136 Other intervertebral disc degeneration, lumbar region: Secondary | ICD-10-CM

## 2016-11-24 DIAGNOSIS — R600 Localized edema: Secondary | ICD-10-CM

## 2016-11-24 LAB — CBC WITH DIFFERENTIAL/PLATELET
BASOS ABS: 0 {cells}/uL (ref 0–200)
Basophils Relative: 0 %
EOS ABS: 170 {cells}/uL (ref 15–500)
EOS PCT: 2 %
HCT: 36.6 % (ref 35.0–45.0)
HEMOGLOBIN: 11.8 g/dL — AB (ref 12.0–15.0)
LYMPHS ABS: 1360 {cells}/uL (ref 850–3900)
Lymphocytes Relative: 16 %
MCH: 30.4 pg (ref 27.0–33.0)
MCHC: 32.2 g/dL (ref 32.0–36.0)
MCV: 94.3 fL (ref 80.0–100.0)
MONOS PCT: 7 %
MPV: 10.8 fL (ref 7.5–12.5)
Monocytes Absolute: 595 cells/uL (ref 200–950)
NEUTROS ABS: 6375 {cells}/uL (ref 1500–7800)
Neutrophils Relative %: 75 %
Platelets: 243 10*3/uL (ref 140–400)
RBC: 3.88 MIL/uL (ref 3.80–5.10)
RDW: 14.5 % (ref 11.0–15.0)
WBC: 8.5 10*3/uL (ref 3.8–10.8)

## 2016-11-24 LAB — COMPREHENSIVE METABOLIC PANEL
ALBUMIN: 3.6 g/dL (ref 3.6–5.1)
ALT: 11 U/L (ref 6–29)
AST: 13 U/L (ref 10–35)
Alkaline Phosphatase: 79 U/L (ref 33–130)
BUN: 31 mg/dL — ABNORMAL HIGH (ref 7–25)
CHLORIDE: 103 mmol/L (ref 98–110)
CO2: 24 mmol/L (ref 20–31)
CREATININE: 1.63 mg/dL — AB (ref 0.60–0.88)
Calcium: 8.9 mg/dL (ref 8.6–10.4)
Glucose, Bld: 180 mg/dL — ABNORMAL HIGH (ref 70–99)
POTASSIUM: 4.3 mmol/L (ref 3.5–5.3)
SODIUM: 139 mmol/L (ref 135–146)
TOTAL PROTEIN: 6.3 g/dL (ref 6.1–8.1)
Total Bilirubin: 0.9 mg/dL (ref 0.2–1.2)

## 2016-11-24 MED ORDER — LINACLOTIDE 72 MCG PO CAPS: 72.0000 ug | ORAL_CAPSULE | Freq: Every day | ORAL | 0 refills | Status: DC

## 2016-11-24 MED ORDER — DERMACLOUD EX CREA: TOPICAL_CREAM | CUTANEOUS | 1 refills | Status: DC

## 2016-11-24 NOTE — Assessment & Plan Note (Signed)
Given linzess 72mg  daily trial

## 2016-11-24 NOTE — Assessment & Plan Note (Signed)
Recheck A1C continue metformin 500mg  daily

## 2016-11-24 NOTE — Patient Instructions (Addendum)
Wear the compression hose  Linzess 72mg   Once a day  Take tylenol 500mg  twice a day for back pain Dermacloud ointment  F/U 4 months

## 2016-11-24 NOTE — Assessment & Plan Note (Signed)
Controlled check labs,renal function

## 2016-11-24 NOTE — Progress Notes (Signed)
   Subjective:    Patient ID: Christine Zavala, female    DOB: 1934-01-31, 83 y.o.   MRN: [id number]  Patient presents for Follow-up (is not fasting); Lower Back Pain; and Constipation   Pt here for  interim f/u, was seen a few weeks ago. Taking lasix 40mg  once a day , not wearing compresison   weight down 9lbs in past 2 weeks.    Constipation-  No taking, any medications, increases veggies, straining with BM   Has also had some back pain on off, more at center, feels more stiff, has known DDD lumbar spine, takes an aleve and this helps,No tingling or numbess or radiating back pain. She has been a little more active walking with her walker  DM- due for repeat A1C 6%, metformin down to 1 tablet daily with breakfast, states blood sugars have been good No meter today      Review Of Systems:  GEN- denies fatigue, fever, weight loss,weakness, recent illness HEENT- denies eye drainage, change in vision, nasal discharge, CVS- denies chest pain, palpitations RESP- denies SOB, cough, wheeze ABD- denies N/V, change in stools, abd pain GU- denies dysuria, hematuria, dribbling, incontinence MSK- +joint pain, muscle aches, injury Neuro- denies headache, dizziness, syncope, seizure activity       Objective:    BP 132/74   Pulse 76   Temp 97.5 F (36.4 C) (Oral)   Resp 14   Ht 5\' 2"  (1.575 m)   Wt 271 lb (122.9 kg)   SpO2 98%   BMI 49.57 kg/m  GEN- NAD, alert and oriented x3 HEENT- PERRL, EOMI, non injected sclera, pink conjunctiva, MMM, oropharynx clear CVS- RRR, no murmur RESP-CTAB ABD-NABS,soft,NT,ND, no CVA tenderness  MSK- Mild TTP lumbar spine, no spasm, fair ROM , neg SLR, decreased ROM Hips/knees  EXT- pedal edema Pulses- Radial 2+         Assessment & Plan:     Also requested a refill on a derma cloud ointment Problem List Items Addressed This Visit    Peripheral edema    Edema much improved her weight is also down. Continue the Lasix to 40 mg daily check renal  function today. She is also to use her compression hose.      Hypertension    Controlled check labs,renal function      Relevant Orders   CBC with Differential/Platelet   Comprehensive metabolic panel   Diabetes mellitus without complication (HCC)    Recheck A1C continue metformin 500mg  daily      Relevant Orders   Hemoglobin A1c   DDD (degenerative disc disease), lumbar    No red flags, use Tylenol twice a day. Constipation may also be cough and some back discomfort. Dementia continue to try to stay active move around with her walker as tolerated.      Constipation - Primary    Given linzess 72mg  daily trial         Note: This dictation was prepared with Dragon dictation along with smaller phrase technology. Any transcriptional errors that result from this process are unintentional.

## 2016-11-24 NOTE — Assessment & Plan Note (Signed)
No red flags, use Tylenol twice a day. Constipation may also be cough and some back discomfort. Dementia continue to try to stay active move around with her walker as tolerated.

## 2016-11-24 NOTE — Assessment & Plan Note (Signed)
Edema much improved her weight is also down. Continue the Lasix to 40 mg daily check renal function today. She is also to use her compression hose.

## 2016-11-25 LAB — HEMOGLOBIN A1C
HEMOGLOBIN A1C: 8.4 % — AB (ref <5.7)
MEAN PLASMA GLUCOSE: 194 mg/dL

## 2016-12-02 ENCOUNTER — Other Ambulatory Visit: Payer: Self-pay | Admitting: *Deleted

## 2016-12-02 DIAGNOSIS — N179 Acute kidney failure, unspecified: Secondary | ICD-10-CM

## 2016-12-02 MED ORDER — GLIMEPIRIDE 1 MG PO TABS: 1.0000 mg | ORAL_TABLET | Freq: Every day | ORAL | 3 refills | Status: DC

## 2016-12-03 ENCOUNTER — Other Ambulatory Visit: Payer: Self-pay | Admitting: Family Medicine

## 2016-12-15 ENCOUNTER — Other Ambulatory Visit: Payer: Medicare Other

## 2016-12-15 DIAGNOSIS — N179 Acute kidney failure, unspecified: Secondary | ICD-10-CM

## 2016-12-15 LAB — BASIC METABOLIC PANEL
BUN: 24 mg/dL (ref 7–25)
CALCIUM: 8.6 mg/dL (ref 8.6–10.4)
CO2: 21 mmol/L (ref 20–31)
Chloride: 109 mmol/L (ref 98–110)
Creat: 1.32 mg/dL — ABNORMAL HIGH (ref 0.60–0.88)
GLUCOSE: 161 mg/dL — AB (ref 70–99)
POTASSIUM: 4.6 mmol/L (ref 3.5–5.3)
Sodium: 141 mmol/L (ref 135–146)

## 2016-12-22 ENCOUNTER — Other Ambulatory Visit: Payer: Self-pay | Admitting: Family Medicine

## 2016-12-25 ENCOUNTER — Other Ambulatory Visit: Payer: Self-pay | Admitting: Family Medicine

## 2016-12-26 ENCOUNTER — Other Ambulatory Visit: Payer: Self-pay | Admitting: Family Medicine

## 2016-12-31 DIAGNOSIS — M71022 Abscess of bursa, left elbow: Secondary | ICD-10-CM | POA: Diagnosis not present

## 2017-02-04 ENCOUNTER — Other Ambulatory Visit: Payer: Self-pay

## 2017-02-04 MED ORDER — GLIMEPIRIDE 1 MG PO TABS: 1.0000 mg | ORAL_TABLET | Freq: Every day | ORAL | 0 refills | Status: DC

## 2017-02-04 MED ORDER — LISINOPRIL 40 MG PO TABS
20.0000 mg | ORAL_TABLET | Freq: Every day | ORAL | 0 refills | Status: DC
Start: 2017-02-04 — End: 2017-03-25

## 2017-02-04 MED ORDER — ONETOUCH ULTRA BLUE VI STRP: ORAL_STRIP | 0 refills | Status: DC

## 2017-02-04 MED ORDER — LINACLOTIDE 72 MCG PO CAPS: 72.0000 ug | ORAL_CAPSULE | Freq: Every day | ORAL | 0 refills | Status: DC

## 2017-02-04 MED ORDER — PRAVASTATIN SODIUM 40 MG PO TABS: 40.0000 mg | ORAL_TABLET | Freq: Every day | ORAL | 0 refills | Status: DC

## 2017-02-04 MED ORDER — MELOXICAM 15 MG PO TABS: 15.0000 mg | ORAL_TABLET | Freq: Every day | ORAL | 0 refills | Status: DC

## 2017-02-04 MED ORDER — FUROSEMIDE 40 MG PO TABS: 40.0000 mg | ORAL_TABLET | Freq: Every day | ORAL | 0 refills | Status: DC | PRN

## 2017-02-04 MED ORDER — CLOPIDOGREL BISULFATE 75 MG PO TABS: ORAL_TABLET | ORAL | 0 refills | Status: DC

## 2017-02-04 NOTE — Telephone Encounter (Signed)
Received fax from [place] in regards to a new rx.I spoke with patient and she is requesting ALL of her medication be sent to [facility] because it's better for her to get them

## 2017-02-20 ENCOUNTER — Other Ambulatory Visit: Payer: Self-pay | Admitting: Family Medicine

## 2017-02-20 NOTE — Telephone Encounter (Signed)
Refill appropriate 

## 2017-03-03 ENCOUNTER — Other Ambulatory Visit: Payer: Self-pay | Admitting: Family Medicine

## 2017-03-25 ENCOUNTER — Ambulatory Visit (INDEPENDENT_AMBULATORY_CARE_PROVIDER_SITE_OTHER): Payer: Medicare Other | Admitting: Family Medicine

## 2017-03-25 ENCOUNTER — Other Ambulatory Visit: Payer: Self-pay | Admitting: Family Medicine

## 2017-03-25 ENCOUNTER — Ambulatory Visit: Payer: Medicare Other | Admitting: Family Medicine

## 2017-03-25 ENCOUNTER — Encounter: Payer: Self-pay | Admitting: Family Medicine

## 2017-03-25 VITALS — BP 136/74 | HR 72 | Temp 97.9°F | Resp 14 | Ht 62.0 in | Wt 284.0 lb

## 2017-03-25 DIAGNOSIS — R2681 Unsteadiness on feet: Secondary | ICD-10-CM | POA: Diagnosis not present

## 2017-03-25 DIAGNOSIS — E119 Type 2 diabetes mellitus without complications: Secondary | ICD-10-CM | POA: Diagnosis not present

## 2017-03-25 DIAGNOSIS — N183 Chronic kidney disease, stage 3 unspecified: Secondary | ICD-10-CM

## 2017-03-25 DIAGNOSIS — R531 Weakness: Secondary | ICD-10-CM | POA: Diagnosis not present

## 2017-03-25 DIAGNOSIS — R8290 Unspecified abnormal findings in urine: Secondary | ICD-10-CM

## 2017-03-25 DIAGNOSIS — R296 Repeated falls: Secondary | ICD-10-CM | POA: Diagnosis not present

## 2017-03-25 DIAGNOSIS — E1122 Type 2 diabetes mellitus with diabetic chronic kidney disease: Secondary | ICD-10-CM | POA: Diagnosis not present

## 2017-03-25 DIAGNOSIS — R609 Edema, unspecified: Secondary | ICD-10-CM

## 2017-03-25 DIAGNOSIS — F0150 Vascular dementia without behavioral disturbance: Secondary | ICD-10-CM

## 2017-03-25 DIAGNOSIS — R600 Localized edema: Secondary | ICD-10-CM

## 2017-03-25 LAB — MICROSCOPIC MESSAGE

## 2017-03-25 LAB — URINALYSIS, ROUTINE W REFLEX MICROSCOPIC
BACTERIA UA: NONE SEEN /HPF
BILIRUBIN URINE: NEGATIVE
Glucose, UA: NEGATIVE
Ketones, ur: NEGATIVE
LEUKOCYTES UA: NEGATIVE
NITRITE: NEGATIVE
PROTEIN: NEGATIVE
Specific Gravity, Urine: 1.015 (ref 1.001–1.03)
WBC UA: NONE SEEN /HPF (ref 0–5)
pH: 6 (ref 5.0–8.0)

## 2017-03-25 NOTE — Progress Notes (Signed)
Subjective:    Patient ID: Christine Zavala, female    DOB: 06/27/1933, 83 y.o.   MRN: [id number]  Patient presents for Follow-up (is not fasting)  Patient here to follow-up chronic medical problems.  Medications reviewed Diabetes mellitus her last A1c 8.4% metformin was stopped secondary to her chronic kidney disease.  She was also on Lasix which was decreased to 20 mg daily.  She was started on glimepiride 1 mg daily.,  Repeat metabolic panel 2 weeks later with the above changes and her creatinine was down from 1.63-1.32 CBG- has not been checking CBG   Fell 2 weeks , lost balance and went down, her legs seem to buckle, walks with cane, then fell again, foot went under recliner she has a small swelling on the top of her right foot and has gone down but would like me to check this. Has not been using rolling walker    Weight is up 13 pounds since her last visit in July she admits that she has been eating out as well as eating a lot of sweets and salt.  She has  Been taking 40mg  every day the Lasix daughter states that when she takes her medication she flipped the bottle over so that she knows that she is taking it that is a system that has been working. Daughter states that it seems like her legs are weak  Hyperlipidemia she is on pravastatin   CKD- stage II due for recheck on creatinine Declines flu shot  She also has mixed vascular dementia she was tried on Aricept in the past but had side effects from the medication.  She does tend to be forgetful daughter states that she requires a lot of reiteration  Declines flu shot   Review Of Systems:  GEN- denies fatigue, fever, weight loss,+weakness, recent illness HEENT- denies eye drainage, change in vision, nasal discharge, CVS- denies chest pain, palpitations RESP- denies SOB, cough, wheeze ABD- denies N/V, change in stools, abd pain GU- denies dysuria, hematuria, dribbling, incontinence MSK-denies joint pain, muscle aches,  injury Neuro- denies headache, dizziness, syncope, seizure activity       Objective:    BP 136/74   Pulse 72   Temp 97.9 F (36.6 C) (Oral)   Resp 14   Ht 5\' 2"  (1.575 m)   Wt 284 lb (128.8 kg)   SpO2 96%   BMI 51.94 kg/m  GEN- NAD, alert and oriented x3,sitting in wheelchair  HEENT- PERRL, EOMI, non injected sclera, pink conjunctiva, MMM, oropharynx clear Neck- Supple, no JVD  CVS- RRR, no murmur RESP-CTAB ABD-NABS,soft,NT,ND EXT- 2+ non pitting edema, mild erythema, small hemtoma top of right foot, NT MSK- ecreased ROM Hips/knees , spine, walks unsteady with cane  Pulses- Radial 2+  DP-1+        Assessment & Plan:      Problem List Items Addressed This Visit      Unprioritized   Generalized weakness   Type 2 diabetes mellitus with diabetic chronic kidney disease ([place]) - Primary    Diabetes with CKD.  Discussed importance of checking her blood sugar and watching her diet.  This was reiterated to her daughter as her son typically provide to meals.  She is going to pass on this information.  We will recheck her A1c would least like to keep her A1c below 8% to prevent any hypoglycemia.  He is on statin drug as well as ACE inhibitor      Relevant Orders   Microalbumin /  creatinine urine ratio   Peripheral edema    Increase lasix to 60mg  po daily x 3 days Wear compression hose  Then resume 40mg  daily until renal function evaluated      Dementia    I think that she does have some progressive dementia though in general she does quite well.  Likely has a mixed component of both vascular and Alzheimer's component.  She did not tolerate the medications before therefore we are going to hold off.  We discussed getting her a life alert bracelet.  We also discussed ways to manage her medications and daughter and son will help administer her medications.  I am going to set her up with physical therapy to help with overall generalized weakness and assess her gait more.  I think  that she needs to use the walker for more stability.      Relevant Orders   Lipid panel    Other Visit Diagnoses    Recurrent falls       Gait instability       Bad odor of urine       check urine, no symptoms of UTI   Relevant Orders   Urinalysis, Routine w reflex microscopic   Urine Culture      Note: This dictation was prepared with Dragon dictation along with smaller phrase technology. Any transcriptional errors that result from this process are unintentional.

## 2017-03-25 NOTE — Assessment & Plan Note (Addendum)
Diabetes with CKD.  Discussed importance of checking her blood sugar and watching her diet.  This was reiterated to her daughter as her son typically provide to meals.  She is going to pass on this information.  We will recheck her A1c would least like to keep her A1c below 8% to prevent any hypoglycemia.  He is on statin drug as well as ACE inhibitor

## 2017-03-25 NOTE — Assessment & Plan Note (Signed)
I think that she does have some progressive dementia though in general she does quite well.  Likely has a mixed component of both vascular and Alzheimer's component.  She did not tolerate the medications before therefore we are going to hold off.  We discussed getting her a life alert bracelet.  We also discussed ways to manage her medications and daughter and son will help administer her medications.  I am going to set her up with physical therapy to help with overall generalized weakness and assess her gait more.  I think that she needs to use the walker for more stability.

## 2017-03-25 NOTE — Assessment & Plan Note (Signed)
Increase lasix to 60mg  po daily x 3 days Wear compression hose  Then resume 40mg  daily until renal function evaluated

## 2017-03-25 NOTE — Patient Instructions (Addendum)
Referral to physical therapy  We will call with lab results  Take 1.5 tablet  of lasix for 3 days  Wear compression hose  Life alert bracelet F/U 4 weeks

## 2017-03-26 LAB — COMPLETE METABOLIC PANEL WITH GFR
AG RATIO: 1.5 (calc) (ref 1.0–2.5)
ALBUMIN MSPROF: 3.8 g/dL (ref 3.6–5.1)
ALT: 13 U/L (ref 6–29)
AST: 17 U/L (ref 10–35)
Alkaline phosphatase (APISO): 77 U/L (ref 33–130)
BUN/Creatinine Ratio: 19 (calc) (ref 6–22)
BUN: 29 mg/dL — ABNORMAL HIGH (ref 7–25)
CHLORIDE: 104 mmol/L (ref 98–110)
CO2: 29 mmol/L (ref 20–32)
Calcium: 9 mg/dL (ref 8.6–10.4)
Creat: 1.51 mg/dL — ABNORMAL HIGH (ref 0.60–0.88)
GFR, EST NON AFRICAN AMERICAN: 32 mL/min/{1.73_m2} — AB (ref >=60)
GFR, Est African American: 37 mL/min/{1.73_m2} — ABNORMAL LOW (ref >=60)
GLUCOSE: 104 mg/dL — AB (ref 65–99)
Globulin: 2.5 g/dL (calc) (ref 1.9–3.7)
POTASSIUM: 4.8 mmol/L (ref 3.5–5.3)
Sodium: 141 mmol/L (ref 135–146)
Total Bilirubin: 0.7 mg/dL (ref 0.2–1.2)
Total Protein: 6.3 g/dL (ref 6.1–8.1)

## 2017-03-26 LAB — CBC WITH DIFFERENTIAL/PLATELET
BASOS ABS: 33 {cells}/uL (ref 0–200)
Basophils Relative: 0.4 %
EOS PCT: 1.9 %
Eosinophils Absolute: 156 cells/uL (ref 15–500)
HCT: 37.1 % (ref 35.0–45.0)
Hemoglobin: 12.4 g/dL (ref 11.7–15.5)
Lymphs Abs: 1599 cells/uL (ref 850–3900)
MCH: 30.6 pg (ref 27.0–33.0)
MCHC: 33.4 g/dL (ref 32.0–36.0)
MCV: 91.6 fL (ref 80.0–100.0)
MPV: 11.5 fL (ref 7.5–12.5)
Monocytes Relative: 7 %
Neutro Abs: 5838 cells/uL (ref 1500–7800)
Neutrophils Relative %: 71.2 %
PLATELETS: 227 10*3/uL (ref 140–400)
RBC: 4.05 10*6/uL (ref 3.80–5.10)
RDW: 13.3 % (ref 11.0–15.0)
TOTAL LYMPHOCYTE: 19.5 %
WBC mixed population: 574 cells/uL (ref 200–950)
WBC: 8.2 10*3/uL (ref 3.8–10.8)

## 2017-03-26 LAB — URINE CULTURE
MICRO NUMBER: 81223034
Result:: NO GROWTH
SPECIMEN QUALITY: ADEQUATE

## 2017-03-26 LAB — HEMOGLOBIN A1C
EAG (MMOL/L): 8.7 (calc)
Hgb A1c MFr Bld: 7.1 % of total Hgb — ABNORMAL HIGH (ref <5.7)
Mean Plasma Glucose: 157 (calc)

## 2017-03-26 LAB — LIPID PANEL
Cholesterol: 229 mg/dL — ABNORMAL HIGH (ref <200)
HDL: 43 mg/dL — ABNORMAL LOW (ref >50)
LDL CHOLESTEROL (CALC): 150 mg/dL — AB
NON-HDL CHOLESTEROL (CALC): 186 mg/dL — AB (ref <130)
TRIGLYCERIDES: 224 mg/dL — AB (ref <150)
Total CHOL/HDL Ratio: 5.3 (calc) — ABNORMAL HIGH (ref <5.0)

## 2017-03-26 LAB — MICROALBUMIN / CREATININE URINE RATIO
CREATININE, URINE: 62 mg/dL (ref 20–275)
Microalb, Ur: <0.2 mg/dL

## 2017-03-27 ENCOUNTER — Ambulatory Visit: Payer: Medicare Other | Admitting: Family Medicine

## 2017-04-02 ENCOUNTER — Encounter: Payer: Self-pay | Admitting: *Deleted

## 2017-04-02 ENCOUNTER — Other Ambulatory Visit: Payer: Self-pay | Admitting: *Deleted

## 2017-04-02 MED ORDER — PRAVASTATIN SODIUM 80 MG PO TABS: 80.0000 mg | ORAL_TABLET | Freq: Every day | ORAL | 3 refills | Status: DC

## 2017-04-09 ENCOUNTER — Other Ambulatory Visit: Payer: Self-pay | Admitting: Family Medicine

## 2017-04-21 ENCOUNTER — Other Ambulatory Visit: Payer: Self-pay | Admitting: Family Medicine

## 2017-04-22 ENCOUNTER — Encounter: Payer: Self-pay | Admitting: Family Medicine

## 2017-04-22 ENCOUNTER — Other Ambulatory Visit: Payer: Self-pay

## 2017-04-22 ENCOUNTER — Ambulatory Visit (INDEPENDENT_AMBULATORY_CARE_PROVIDER_SITE_OTHER): Payer: Medicare Other | Admitting: Family Medicine

## 2017-04-22 VITALS — BP 132/72 | HR 80 | Temp 97.9°F | Resp 16 | Ht 62.0 in | Wt 285.0 lb

## 2017-04-22 DIAGNOSIS — R350 Frequency of micturition: Secondary | ICD-10-CM

## 2017-04-22 DIAGNOSIS — N183 Chronic kidney disease, stage 3 unspecified: Secondary | ICD-10-CM

## 2017-04-22 DIAGNOSIS — R269 Unspecified abnormalities of gait and mobility: Secondary | ICD-10-CM

## 2017-04-22 DIAGNOSIS — E1122 Type 2 diabetes mellitus with diabetic chronic kidney disease: Secondary | ICD-10-CM | POA: Diagnosis not present

## 2017-04-22 DIAGNOSIS — N393 Stress incontinence (female) (male): Secondary | ICD-10-CM | POA: Diagnosis not present

## 2017-04-22 DIAGNOSIS — M51369 Other intervertebral disc degeneration, lumbar region without mention of lumbar back pain or lower extremity pain: Secondary | ICD-10-CM

## 2017-04-22 DIAGNOSIS — E782 Mixed hyperlipidemia: Secondary | ICD-10-CM | POA: Diagnosis not present

## 2017-04-22 DIAGNOSIS — M5136 Other intervertebral disc degeneration, lumbar region: Secondary | ICD-10-CM | POA: Diagnosis not present

## 2017-04-22 MED ORDER — MELOXICAM 15 MG PO TABS: ORAL_TABLET | ORAL | 0 refills | Status: DC

## 2017-04-22 NOTE — Patient Instructions (Addendum)
Check your blood sugar 2-3 times a week Take tylenol for pain Or take 1/2 tablet of the meloxicam but NOT every day due to kidneys and PLAVIX Physical therapy to be set up again  Urology referral  F/U 3 months

## 2017-04-22 NOTE — Progress Notes (Signed)
   Subjective:    Patient ID: Christine Zavala, female    DOB: 1933-07-30, 83 y.o.   MRN: [id number]  Patient presents for Follow-up (is not fasting)  Pt here to f/u last visit  1. Weakness/recurrent falls- states phone number was changed, so she never had HHPT set up, still interested  2. Hyperlipidemia she has been taking both pravastatin, so 120mg , felt sleepier with both, has corrected problem now, no muscle aches, or new leg pains  3. Has urinary frequency, known incontinence, would like to go to urology, has tried bladder medications  4. Chronic back pain, reviewed medications, has Mobic 15mg  also takes tylenol which helps  5. DM- at goal, but not checking CBG, also discussed foods to avoid, eating a lot of fast foot, weightup 1 lb     Review Of Systems:  GEN- denies fatigue, fever, weight loss,weakness, recent illness HEENT- denies eye drainage, change in vision, nasal discharge, CVS- denies chest pain, palpitations RESP- denies SOB, cough, wheeze ABD- denies N/V, change in stools, abd pain GU- denies dysuria, hematuria, dribbling, incontinence MSK-+ joint pain, muscle aches, injury Neuro- denies headache, dizziness, syncope, seizure activity       Objective:    BP 132/72   Pulse 80   Temp 97.9 F (36.6 C) (Oral)   Resp 16   Ht 5\' 2"  (1.575 m)   Wt 285 lb (129.3 kg)   SpO2 97%   BMI 52.13 kg/m  GEN- NAD, alert and oriented x3,sitting in chair, walks with cane  HEENT- PERRL, EOMI, non injected sclera, pink conjunctiva, MMM, oropharynx clear CVS- RRR, no murmur RESP-CTAB ABD-NABS,soft,NT,ND, no CVA tenderness MSK- mild TTP lumbar spine, decreaed ROM, neg SLR EXT- CHRONIC EDEMA, wearing compression hose  Pulses- Radial, DP- 2+        Assessment & Plan:      Problem List Items Addressed This Visit      Unprioritized   Abnormality of gait   Type 2 diabetes mellitus with diabetic chronic kidney disease ([place])    Continue meds, check cbg at least 2-3x per  week Reiterated diet       Hyperlipidemia    Take pravastatin 80mg  once a day  Took wrong dose for about 3 weeks      DDD (degenerative disc disease), lumbar - Primary    Chronic pain, stop daily MOBIC due to CKD If she does need take 1/2 tablet But advised to use tylenol PT will also help with mobility, no recent falls       Relevant Medications   meloxicam (MOBIC) 15 MG tablet    Other Visit Diagnoses    Stress incontinence of urine       Check UA, but refer to urology at family request    Urinary frequency       Relevant Orders   Urinalysis, Routine w reflex microscopic (Completed)   Urine Culture      Note: This dictation was prepared with Dragon dictation along with smaller phrase technology. Any transcriptional errors that result from this process are unintentional.

## 2017-04-23 ENCOUNTER — Encounter: Payer: Self-pay | Admitting: Family Medicine

## 2017-04-23 ENCOUNTER — Other Ambulatory Visit: Payer: Self-pay | Admitting: *Deleted

## 2017-04-23 LAB — URINALYSIS, ROUTINE W REFLEX MICROSCOPIC
Bilirubin Urine: NEGATIVE
GLUCOSE, UA: NEGATIVE
HGB URINE DIPSTICK: NEGATIVE
Hyaline Cast: NONE SEEN /LPF
KETONES UR: NEGATIVE
Nitrite: POSITIVE — AB
PH: 5.5 (ref 5.0–8.0)
PROTEIN: NEGATIVE
Specific Gravity, Urine: 1.018 (ref 1.001–1.03)

## 2017-04-23 MED ORDER — CEPHALEXIN 250 MG PO CAPS
250.0000 mg | ORAL_CAPSULE | Freq: Four times a day (QID) | ORAL | 0 refills | Status: DC
Start: 2017-04-23 — End: 2017-07-29

## 2017-04-23 NOTE — Assessment & Plan Note (Signed)
Continue meds, check cbg at least 2-3x per week Reiterated diet

## 2017-04-23 NOTE — Assessment & Plan Note (Signed)
Chronic pain, stop daily MOBIC due to CKD If she does need take 1/2 tablet But advised to use tylenol PT will also help with mobility, no recent falls

## 2017-04-23 NOTE — Assessment & Plan Note (Signed)
Take pravastatin 80mg  once a day  Took wrong dose for about 3 weeks

## 2017-04-24 ENCOUNTER — Telehealth: Payer: Self-pay | Admitting: Family Medicine

## 2017-04-24 NOTE — Telephone Encounter (Signed)
-----   Message from Olena Mater, LPN sent at [id number]  9:51 AM EST ----- You placed referral for Physical Therapy beginning of November.  I called the [facility] agency to find out why not started.  They told me they had called her and she refused.  "Not letting anyone in my house"  I told them she was here for visit and is now agreeable to P.T.  They just called me back and she has refused them again.  I also spoke to daughter and she is aware that mother has refused services.  She wants to wait until her incontinence resolves so she isn't "peeing all over herself" while getting therapy.    FYI

## 2017-04-25 LAB — URINE CULTURE
MICRO NUMBER:: 81337728
SPECIMEN QUALITY:: ADEQUATE

## 2017-05-02 ENCOUNTER — Other Ambulatory Visit: Payer: Self-pay | Admitting: Family Medicine

## 2017-05-06 ENCOUNTER — Other Ambulatory Visit: Payer: Self-pay | Admitting: *Deleted

## 2017-05-06 MED ORDER — PRAVASTATIN SODIUM 80 MG PO TABS: 80.0000 mg | ORAL_TABLET | Freq: Every day | ORAL | 3 refills | Status: DC

## 2017-05-12 ENCOUNTER — Other Ambulatory Visit: Payer: Self-pay | Admitting: Family Medicine

## 2017-06-17 DIAGNOSIS — R350 Frequency of micturition: Secondary | ICD-10-CM | POA: Diagnosis not present

## 2017-06-17 DIAGNOSIS — R310 Gross hematuria: Secondary | ICD-10-CM | POA: Diagnosis not present

## 2017-06-23 ENCOUNTER — Other Ambulatory Visit: Payer: Self-pay | Admitting: Family Medicine

## 2017-07-08 DIAGNOSIS — R310 Gross hematuria: Secondary | ICD-10-CM | POA: Diagnosis not present

## 2017-07-08 DIAGNOSIS — N281 Cyst of kidney, acquired: Secondary | ICD-10-CM | POA: Diagnosis not present

## 2017-07-14 DIAGNOSIS — R310 Gross hematuria: Secondary | ICD-10-CM | POA: Diagnosis not present

## 2017-07-21 ENCOUNTER — Other Ambulatory Visit: Payer: Self-pay | Admitting: Family Medicine

## 2017-07-29 ENCOUNTER — Other Ambulatory Visit: Payer: Self-pay

## 2017-07-29 ENCOUNTER — Encounter: Payer: Self-pay | Admitting: Family Medicine

## 2017-07-29 ENCOUNTER — Ambulatory Visit (INDEPENDENT_AMBULATORY_CARE_PROVIDER_SITE_OTHER): Payer: Medicare Other | Admitting: Family Medicine

## 2017-07-29 VITALS — BP 130/66 | HR 74 | Temp 97.9°F | Resp 16 | Ht 62.0 in | Wt 285.0 lb

## 2017-07-29 DIAGNOSIS — F0150 Vascular dementia without behavioral disturbance: Secondary | ICD-10-CM | POA: Diagnosis not present

## 2017-07-29 DIAGNOSIS — E1122 Type 2 diabetes mellitus with diabetic chronic kidney disease: Secondary | ICD-10-CM | POA: Diagnosis not present

## 2017-07-29 DIAGNOSIS — I10 Essential (primary) hypertension: Secondary | ICD-10-CM | POA: Diagnosis not present

## 2017-07-29 DIAGNOSIS — N183 Chronic kidney disease, stage 3 unspecified: Secondary | ICD-10-CM

## 2017-07-29 DIAGNOSIS — G6289 Other specified polyneuropathies: Secondary | ICD-10-CM | POA: Diagnosis not present

## 2017-07-29 DIAGNOSIS — Z85048 Personal history of other malignant neoplasm of rectum, rectosigmoid junction, and anus: Secondary | ICD-10-CM | POA: Diagnosis not present

## 2017-07-29 DIAGNOSIS — E782 Mixed hyperlipidemia: Secondary | ICD-10-CM | POA: Diagnosis not present

## 2017-07-29 NOTE — Assessment & Plan Note (Signed)
Well controlled no changes 

## 2017-07-29 NOTE — Assessment & Plan Note (Signed)
Recommend children assist with checking her CBG Goal A1C less than 8% with age and mobility  On statin

## 2017-07-29 NOTE — Progress Notes (Signed)
   Subjective:    Patient ID: Christine Zavala, female    DOB: July 15, 1933, 84 y.o.   MRN: [id number]  Patient presents for Follow-up (is not fasting)   Pt here to f/u chronic medical problems   Seen by Dr. Matilde Sprang- given trimpex once a day by Urology for recurrent UTI/OAB   DM- 7.1%, not checking blood sugars, no hypoglycemia symptoms   Meals on Wheels coming  [place]- foot was caught in the walker , had bruised up shoulder and leg, her daughter and son  Uses cane   Wearin compresion hose  AND taking lasix  Review Of Systems:  GEN- denies fatigue, fever, weight loss,weakness, recent illness HEENT- denies eye drainage, change in vision, nasal discharge, CVS- denies chest pain, palpitations RESP- denies SOB, cough, wheeze ABD- denies N/V, change in stools, abd pain GU- denies dysuria, hematuria, dribbling, incontinence MSK- + joint pain, muscle aches, injury Neuro- denies headache, dizziness, syncope, seizure activity       Objective:    BP 130/66   Pulse 74   Temp 97.9 F (36.6 C) (Oral)   Resp 16   Ht 5\' 2"  (1.575 m)   Wt 285 lb (129.3 kg)   SpO2 97%   BMI 52.13 kg/m  GEN- NAD, alert and oriented x3,walking with cane  HEENT- PERRL, EOMI, non injected sclera, pink conjunctiva, MMM, oropharynx clear CVS- RRR, no murmur RESP-CTAB ABD-NABS,soft,NT,ND EXT- + edema, wearing compresson hose L >R MSK- Fair ROM upper and lower ext, NT with ROM Pulses- Radial 2+        Assessment & Plan:      Problem List Items Addressed This Visit      Unprioritized   History of rectal cancer   Peripheral neuropathy   Type 2 diabetes mellitus with diabetic chronic kidney disease ([place])    Recommend children assist with checking her CBG Goal A1C less than 8% with age and mobility  On statin      Relevant Orders   CBC with Differential/Platelet   Comprehensive metabolic panel   Hemoglobin A1c   Lipid panel   Hypertension - Primary    Well controlled no changes       Relevant Orders   Lipid panel   Hyperlipidemia    Recheck lipids on statin      Dementia    Overall doing okay, continues to have falls, recommend she use her walker most of time instead of cane, which I dont think she is as steady with. Also reiterated life alert bracelet         Note: This dictation was prepared with Dragon dictation along with smaller phrase technology. Any transcriptional errors that result from this process are unintentional.

## 2017-07-29 NOTE — Patient Instructions (Addendum)
Get the life alert   Release of records - [facility]   f/u 4 MONTHS FOR PHYSICAL

## 2017-07-29 NOTE — Assessment & Plan Note (Signed)
Recheck lipids on statin 

## 2017-07-29 NOTE — Assessment & Plan Note (Signed)
Overall doing okay, continues to have falls, recommend she use her walker most of time instead of cane, which I dont think she is as steady with. Also reiterated life alert bracelet

## 2017-07-30 LAB — LIPID PANEL
CHOL/HDL RATIO: 5.6 (calc) — AB (ref <5.0)
CHOLESTEROL: 217 mg/dL — AB (ref <200)
HDL: 39 mg/dL — AB (ref >50)
LDL CHOLESTEROL (CALC): 137 mg/dL — AB
NON-HDL CHOLESTEROL (CALC): 178 mg/dL — AB (ref <130)
Triglycerides: 268 mg/dL — ABNORMAL HIGH (ref <150)

## 2017-07-30 LAB — COMPREHENSIVE METABOLIC PANEL
AG Ratio: 1.4 (calc) (ref 1.0–2.5)
ALKALINE PHOSPHATASE (APISO): 88 U/L (ref 33–130)
ALT: 14 U/L (ref 6–29)
AST: 12 U/L (ref 10–35)
Albumin: 3.8 g/dL (ref 3.6–5.1)
BILIRUBIN TOTAL: 0.6 mg/dL (ref 0.2–1.2)
BUN/Creatinine Ratio: 17 (calc) (ref 6–22)
BUN: 28 mg/dL — ABNORMAL HIGH (ref 7–25)
CALCIUM: 8.8 mg/dL (ref 8.6–10.4)
CHLORIDE: 107 mmol/L (ref 98–110)
CO2: 24 mmol/L (ref 20–32)
CREATININE: 1.66 mg/dL — AB (ref 0.60–0.88)
GLOBULIN: 2.7 g/dL (ref 1.9–3.7)
Glucose, Bld: 131 mg/dL — ABNORMAL HIGH (ref 65–99)
POTASSIUM: 5.3 mmol/L (ref 3.5–5.3)
SODIUM: 139 mmol/L (ref 135–146)
TOTAL PROTEIN: 6.5 g/dL (ref 6.1–8.1)

## 2017-07-30 LAB — CBC WITH DIFFERENTIAL/PLATELET
BASOS PCT: 0.6 %
Basophils Absolute: 49 cells/uL (ref 0–200)
EOS PCT: 3 %
Eosinophils Absolute: 243 cells/uL (ref 15–500)
HEMATOCRIT: 39.8 % (ref 35.0–45.0)
Hemoglobin: 12.6 g/dL (ref 11.7–15.5)
LYMPHS ABS: 1774 {cells}/uL (ref 850–3900)
MCH: 29.4 pg (ref 27.0–33.0)
MCHC: 31.7 g/dL — ABNORMAL LOW (ref 32.0–36.0)
MCV: 93 fL (ref 80.0–100.0)
MPV: 11.3 fL (ref 7.5–12.5)
Monocytes Relative: 6.8 %
NEUTROS ABS: 5484 {cells}/uL (ref 1500–7800)
Neutrophils Relative %: 67.7 %
Platelets: 250 10*3/uL (ref 140–400)
RBC: 4.28 10*6/uL (ref 3.80–5.10)
RDW: 13.2 % (ref 11.0–15.0)
Total Lymphocyte: 21.9 %
WBC: 8.1 10*3/uL (ref 3.8–10.8)
WBCMIX: 551 {cells}/uL (ref 200–950)

## 2017-07-30 LAB — HEMOGLOBIN A1C
HEMOGLOBIN A1C: 8.1 %{Hb} — AB (ref <5.7)
Mean Plasma Glucose: 186 (calc)
eAG (mmol/L): 10.3 (calc)

## 2017-08-21 ENCOUNTER — Telehealth: Payer: Self-pay

## 2017-08-21 NOTE — Telephone Encounter (Signed)
Patient daughter called and states her mom has body aches that started today and , nasal congestion that started Wednesday, Patient has not had any other symptoms. Christine Zavala was advised she could give the patient mucinex to help with the congestion and if her symptoms did not improve over the weekend or  either go to urgent care or follow up Monday.

## 2017-09-23 DIAGNOSIS — R350 Frequency of micturition: Secondary | ICD-10-CM | POA: Diagnosis not present

## 2017-10-20 ENCOUNTER — Other Ambulatory Visit: Payer: Self-pay | Admitting: Family Medicine

## 2017-10-21 ENCOUNTER — Other Ambulatory Visit: Payer: Self-pay

## 2017-10-21 ENCOUNTER — Ambulatory Visit (INDEPENDENT_AMBULATORY_CARE_PROVIDER_SITE_OTHER): Payer: Medicare Other | Admitting: Family Medicine

## 2017-10-21 VITALS — BP 124/72 | HR 72 | Temp 97.9°F | Resp 16

## 2017-10-21 DIAGNOSIS — R609 Edema, unspecified: Secondary | ICD-10-CM | POA: Diagnosis not present

## 2017-10-21 DIAGNOSIS — M25552 Pain in left hip: Secondary | ICD-10-CM | POA: Diagnosis not present

## 2017-10-21 DIAGNOSIS — M159 Polyosteoarthritis, unspecified: Secondary | ICD-10-CM | POA: Diagnosis not present

## 2017-10-21 DIAGNOSIS — N3281 Overactive bladder: Secondary | ICD-10-CM | POA: Diagnosis not present

## 2017-10-21 DIAGNOSIS — R3915 Urgency of urination: Secondary | ICD-10-CM

## 2017-10-21 DIAGNOSIS — M545 Low back pain: Secondary | ICD-10-CM | POA: Diagnosis not present

## 2017-10-21 DIAGNOSIS — R7989 Other specified abnormal findings of blood chemistry: Secondary | ICD-10-CM | POA: Diagnosis not present

## 2017-10-21 DIAGNOSIS — R5383 Other fatigue: Secondary | ICD-10-CM | POA: Diagnosis not present

## 2017-10-21 DIAGNOSIS — M25551 Pain in right hip: Secondary | ICD-10-CM

## 2017-10-21 LAB — MICROSCOPIC MESSAGE

## 2017-10-21 LAB — URINALYSIS, ROUTINE W REFLEX MICROSCOPIC
Bilirubin Urine: NEGATIVE
GLUCOSE, UA: NEGATIVE
Hyaline Cast: NONE SEEN /LPF
Ketones, ur: NEGATIVE
NITRITE: NEGATIVE
SPECIFIC GRAVITY, URINE: 1.015 (ref 1.001–1.03)
pH: 5 (ref 5.0–8.0)

## 2017-10-21 MED ORDER — CEPHALEXIN 500 MG PO CAPS: 500.0000 mg | ORAL_CAPSULE | Freq: Three times a day (TID) | ORAL | 0 refills | Status: DC

## 2017-10-21 MED ORDER — HYDROCODONE-ACETAMINOPHEN 5-325 MG PO TABS: 1.0000 | ORAL_TABLET | Freq: Two times a day (BID) | ORAL | 0 refills | Status: AC | PRN

## 2017-10-21 MED ORDER — PREDNISONE 20 MG PO TABS: ORAL_TABLET | ORAL | 0 refills | Status: DC

## 2017-10-21 NOTE — Patient Instructions (Signed)
Will start treatment for UTI, there is bacteria present in urine.  Culture ordered.  Starting medications for pain and inflammation related to possible arthritis in both of your hips.  I have ordered x-rays which I would like you to go get once you are feeling a little bit better, may be Friday morning.  Repeat some basic lab work here today.  We will contact you with results.  If you have any palpitations, passing out episodes, chest pain, nausea, vomiting, confusion, difficulty with balance, new or severe abdominal or back pain go to the ER.

## 2017-10-21 NOTE — Progress Notes (Signed)
Patient ID: Christine Zavala, female    DOB: [birth year], 84 y.o.   MRN: [id number]  PCP: Alycia Rossetti, MD  Chief Complaint  Patient presents with  . Urinary Tract Infection    Patient in with c/o low back pain. Onset 1 month ago. Also has concerns of feeling weak    Subjective:   Christine Zavala is a 84 y.o. female, presents to clinic with CC with multiple complaints, she is accompanied by her daughter who has additional concerns.  She is most concerned with,, alternating and worsening bilateral hip pain.  Is currently more severe in her left hip, is exacerbated by standing.  Her daughter and her states she did have x-rays done in the past however I cannot find them in the record or scanned into the record.  There is history of degenerative disc disease in her low back and MRI several years ago, and personally reviewed this.  Hip pain is bilateral with some associated pain in her back it does tennis shoe down outside of the hip into the legs.  Is currently not exacerbated with rotation of her hips or flexion or extension of her legs but specifically is worse with weightbearing.  She was brought in today with a wheelchair although she does use a at home although she has been urged to use a walker.  Patient was initially scheduled for a visit for worsening urinary frequency, urgency and incontinence.  Patient denies any worsening of any of the symptoms.  She has gone to [facility] urology and is being treated with medications which she states has not changed any of her symptoms.  She denies any worsening of her urge or overflow incontinence, denies any dysuria, hematuria, frequency, decreased urine output.  Her daughter with her does seem to be very concerned about this with her history of urinary tract infections.  She also states that she has been very tired and weak in addition to worsening urinary symptoms.   Patient denies any chest pain, shortness of breath, exertional or orthopnea, no change to her  lower extremity edema, she denies abdominal pain, nausea, vomiting although her daughter states that her appetite has decreased over the past several days.  Denies fever, sweats, chills. Does have a history of type 2 diabetes, hypertension, hyperlipidemia, peripheral edema, osteoporosis, osteoarthritis, degenerative disc disease, overactive bladder.  Patient notes that she was seen less than 3 months ago by her PCP, Dr. Buelah Manis, for a routine follow-up of all of her chronic medical issues.  At that time most of her chronic medical conditions were well controlled, UTIs and overactive bladder were being referred out to urology, she was encouraged to use her walker most the time because of gradually worsening gait with some unsteadiness and a recent fall.      Patient Active Problem List   Diagnosis Date Noted  . Generalized weakness 05/16/2016  . Vertigo 06/27/2015  . Abnormality of gait 04/12/2015  . Constipation 08/21/2014  . Itchy scalp 08/21/2014  . Osteoarthritis 03/06/2014  . Peripheral neuropathy 03/06/2014  . Varicose veins 03/06/2014  . DDD (degenerative disc disease), lumbar 11/29/2013  . Renal cyst 11/29/2013  . OAB (overactive bladder) 08/24/2013  . Osteoporosis, unspecified   . Peripheral edema 11/15/2012  . History of rectal cancer   . Hyperlipidemia   . Dementia   . History of CVA (cerebrovascular accident)   . Hypertension   . Type 2 diabetes mellitus with diabetic chronic kidney disease ([place])  Prior to Admission medications   Medication Sig Start Date End Date Taking? Authorizing Provider  clopidogrel (PLAVIX) 75 MG tablet TAKE 1 TABLET BY MOUTH EVERY DAY 02/20/17  Yes [place], Modena Nunnery, MD  furosemide (LASIX) 40 MG tablet TAKE 1 TABLET BY MOUTH  DAILY AS NEEDED 10/20/17  Yes [place], Modena Nunnery, MD  glimepiride (AMARYL) 1 MG tablet TAKE 1 TABLET BY MOUTH  DAILY WITH BREAKFAST 10/20/17  Yes [place], Modena Nunnery, MD  [facility]) CREA Apply to  affected areas three times a day as needed 11/24/16  Yes [place], Modena Nunnery, MD  lisinopril (PRINIVIL,ZESTRIL) 40 MG tablet TAKE 1 TABLET EVERY DAY 10/20/17  Yes [place], Modena Nunnery, MD  meloxicam (MOBIC) 15 MG tablet TAKE 1 TABLET BY MOUTH  DAILY 07/21/17  Yes [place], Modena Nunnery, MD  MYRBETRIQ 50 MG TB24 tablet  10/20/17  Yes [provider]  pravastatin (PRAVACHOL) 80 MG tablet Take 1 tablet (80 mg total) by mouth daily. 05/06/17  Yes [place], Modena Nunnery, MD  trimethoprim (TRIMPEX) 100 MG tablet  07/14/17  Yes [provider]     Allergies  Allergen Reactions  . Aspirin Nausea And Vomiting  . Calcium-Containing Compounds     Legs burn  . Codeine Sulfate Nausea And Vomiting  . Lipitor [Atorvastatin]     myalgias  . Zocor [Simvastatin]     pruritis  . Zyrtec [Cetirizine]      Family History  Problem Relation Age of Onset  . Cancer Mother   . COPD Mother   . Dementia Father      Social History   Socioeconomic History  . Marital status: Widowed    Spouse name: Not on file  . Number of children: 2  . Years of education: 17  . Highest education level: Not on file  Occupational History  . Occupation: retired    Fish farm manager: RETIRED  Social Needs  . Financial resource strain: Not on file  . Food insecurity:    Worry: Not on file    Inability: Not on file  . Transportation needs:    Medical: Not on file    Non-medical: Not on file  Tobacco Use  . Smoking status: Former Research scientist (life sciences)  . Smokeless tobacco: Never Used  . Tobacco comment: quit 1970's  Substance and Sexual Activity  . Alcohol use: No  . Drug use: No  . Sexual activity: Not on file  Lifestyle  . Physical activity:    Days per week: Not on file    Minutes per session: Not on file  . Stress: Not on file  Relationships  . Social connections:    Talks on phone: Not on file    Gets together: Not on file    Attends religious service: Not on file    Active member of club or organization: Not on file     Attends meetings of clubs or organizations: Not on file    Relationship status: Not on file  . Intimate partner violence:    Fear of current or ex partner: Not on file    Emotionally abused: Not on file    Physically abused: Not on file    Forced sexual activity: Not on file  Other Topics Concern  . Not on file  Social History Narrative   Patient lives at home with her Son Sheliah Plane). Patient is a widowed. Patient is retired. Right handed.   Caffeine - one cup daily     Review of Systems  Constitutional: Positive for activity change, appetite change and fatigue. Negative for chills, diaphoresis, fever and unexpected weight change.  HENT: Negative.   Eyes: Negative.   Respiratory: Negative.   Cardiovascular: Negative.   Gastrointestinal: Negative.   Genitourinary: Positive for frequency and urgency. Negative for decreased urine volume, difficulty urinating, dysuria, flank pain, genital sores and hematuria.  Musculoskeletal: Positive for arthralgias, back pain and gait problem.  Skin: Negative.  Negative for color change, pallor and rash.  Neurological: Negative for dizziness, tremors, syncope, facial asymmetry, weakness, light-headedness, numbness and headaches.  Psychiatric/Behavioral: Negative for confusion.  All other systems reviewed and are negative.      Objective:    Vitals:   10/21/17 1543  BP: 124/72  Pulse: 72  Resp: 16  Temp: 97.9 F (36.6 C)  TempSrc: Oral  SpO2: 95%      Physical Exam  Constitutional: She appears well-developed. No distress.  Elderly female, obese, in wheelchair, no acute distress nontoxic-appearing, alert  HENT:  Head: Normocephalic and atraumatic.  Nose: Nose normal.  Mouth/Throat: Oropharynx is clear and moist. No oropharyngeal exudate.  Eyes: Pupils are equal, round, and reactive to light. Conjunctivae are normal. Right eye exhibits no discharge. Left eye exhibits no discharge. No scleral icterus.  Neck: Normal range of  motion. Neck supple.  Cardiovascular: Normal rate, regular rhythm, normal heart sounds and intact distal pulses. Exam reveals no gallop and no friction rub.  No murmur heard. Pulses:      Radial pulses are 2+ on the right side, and 2+ on the left side.  1+ b/l pretibial pitting edema  Pulmonary/Chest: Effort normal and breath sounds normal. No stridor. No respiratory distress. She has no wheezes. She has no rales.  Abdominal: Soft. Bowel sounds are normal. She exhibits no distension and no mass. There is no tenderness. There is no rigidity, no rebound, no guarding and no CVA tenderness.  Obese abdomen, soft, normal bowel sounds x4 No CVA tenderness bilaterally  Musculoskeletal:  No tenderness or step-off palpated to midline spine and spinal processes from cervical to lumbar spine No paraspinal muscle spasms or tenderness palpated from cervical to lumbar spine bilaterally From wheelchair patient able to externally and internally rotate and flex bilateral hips without any pain or crepitus She points to left lateral hip over greater trochanter area when asked where her pain is, no reproducible pain with palpation  Lymphadenopathy:    She has no cervical adenopathy.  Neurological: She is alert. No sensory deficit. Coordination (moves all extremities) normal.  Skin: Skin is warm. Capillary refill takes less than 2 seconds. No rash noted. She is not diaphoretic. No pallor.  Nursing note and vitals reviewed.         Assessment & Plan:      ICD-10-CM   1. Low back pain, unspecified back pain laterality, unspecified chronicity, with sciatica presence unspecified M54.5 Urinalysis, Routine w reflex microscopic    cephALEXin (KEFLEX) 500 MG capsule  2. Urinary urgency R39.15 Urine Culture    cephALEXin (KEFLEX) 500 MG capsule  3. Fatigue, unspecified type R53.83 CBC with Differential    COMPLETE METABOLIC PANEL WITH GFR    Brain natriuretic peptide  4. Pain of both hip joints M25.551  HYDROcodone-acetaminophen (NORCO) 5-325 MG tablet   M25.552 predniSONE (DELTASONE) 20 MG tablet    DG HIPS BILAT WITH PELVIS 3-4 VIEWS  5. Osteoarthritis of multiple joints, unspecified osteoarthritis type M15.9 HYDROcodone-acetaminophen (NORCO) 5-325 MG tablet    predniSONE (DELTASONE) 20 MG tablet  DG HIPS BILAT WITH PELVIS 3-4 VIEWS     Patient is a elderly female, multiple chronic medical problems and history of degenerative disc disease, osteoarthritis and osteoporosis.  His main complaint is hip pain that seems to jump from hip to hip, today currently is worse in the left hip and only exacerbated with weightbearing, and not with any movement which was done today while she sat in a wheelchair.  Daughter's main complaint was urinary symptoms and generalized weakness which does not seem to have any concerning cardiac or pulmonary component.  Her lower examinee edema is at baseline.  She denies any respiratory or cardiac symptoms.  Even her age and multiple medical problems obtain urine with urine culture and random basic labs including BNP.  She has no history of heart failure, she has some echocardiogram ordered in the past but not completed, so will use BNP to help rule out any new CHF.  It is new to me, she does have some mild bilateral pitting edema which they state is at her baseline and otherwise her oral mucosa is mildly dry and she appears euvolemic to mildly hypovolemic.  Blood pressure is within normal limits slightly lower than what appears to be her baseline.    This was significant for likely UTI, culture sent off.  We will start treatment for this, impressed upon patient's daughter that she needs to be with someone at all times, increase her hydration and take her antibiotics.  If she gets confused has any abdominal pain, nausea or vomiting they need to go to the ER for evaluation.  Patient really wanted her hips looked at and evaluated however she seems to acutely ill, likely from a  UTI, to be able to do this today, but I did put in x-ray since I cannot find any in our electronic record here.  I discussed with the daughter doing these in a few days when she feels well enough to do them.  Also advised very close follow-up to come back and see me in 2 days for recheck before the weekend.  Delsa Grana, PA-C 10/21/17 4:27 PM

## 2017-10-22 ENCOUNTER — Other Ambulatory Visit: Payer: Self-pay

## 2017-10-22 ENCOUNTER — Emergency Department (HOSPITAL_COMMUNITY): Payer: Medicare Other

## 2017-10-22 ENCOUNTER — Encounter (HOSPITAL_COMMUNITY): Payer: Self-pay | Admitting: Emergency Medicine

## 2017-10-22 ENCOUNTER — Inpatient Hospital Stay (HOSPITAL_COMMUNITY)
Admission: EM | Admit: 2017-10-22 | Discharge: 2017-10-24 | DRG: 684 | Disposition: A | Discharge status: Home / Self Care | Payer: Medicare Other | Attending: Internal Medicine | Admitting: Internal Medicine

## 2017-10-22 DIAGNOSIS — I129 Hypertensive chronic kidney disease with stage 1 through stage 4 chronic kidney disease, or unspecified chronic kidney disease: Secondary | ICD-10-CM | POA: Diagnosis not present

## 2017-10-22 DIAGNOSIS — E1122 Type 2 diabetes mellitus with diabetic chronic kidney disease: Secondary | ICD-10-CM | POA: Diagnosis present

## 2017-10-22 DIAGNOSIS — Z86718 Personal history of other venous thrombosis and embolism: Secondary | ICD-10-CM

## 2017-10-22 DIAGNOSIS — N3946 Mixed incontinence: Secondary | ICD-10-CM | POA: Diagnosis present

## 2017-10-22 DIAGNOSIS — Z886 Allergy status to analgesic agent status: Secondary | ICD-10-CM | POA: Diagnosis not present

## 2017-10-22 DIAGNOSIS — F0390 Unspecified dementia without behavioral disturbance: Secondary | ICD-10-CM | POA: Diagnosis present

## 2017-10-22 DIAGNOSIS — N183 Chronic kidney disease, stage 3 (moderate): Secondary | ICD-10-CM | POA: Diagnosis not present

## 2017-10-22 DIAGNOSIS — N390 Urinary tract infection, site not specified: Secondary | ICD-10-CM | POA: Diagnosis not present

## 2017-10-22 DIAGNOSIS — Z8673 Personal history of transient ischemic attack (TIA), and cerebral infarction without residual deficits: Secondary | ICD-10-CM | POA: Diagnosis not present

## 2017-10-22 DIAGNOSIS — N12 Tubulo-interstitial nephritis, not specified as acute or chronic: Secondary | ICD-10-CM | POA: Diagnosis not present

## 2017-10-22 DIAGNOSIS — Z885 Allergy status to narcotic agent status: Secondary | ICD-10-CM | POA: Diagnosis not present

## 2017-10-22 DIAGNOSIS — E875 Hyperkalemia: Secondary | ICD-10-CM | POA: Diagnosis not present

## 2017-10-22 DIAGNOSIS — M549 Dorsalgia, unspecified: Secondary | ICD-10-CM | POA: Diagnosis not present

## 2017-10-22 DIAGNOSIS — E785 Hyperlipidemia, unspecified: Secondary | ICD-10-CM | POA: Diagnosis present

## 2017-10-22 DIAGNOSIS — N3281 Overactive bladder: Secondary | ICD-10-CM | POA: Diagnosis present

## 2017-10-22 DIAGNOSIS — Z85048 Personal history of other malignant neoplasm of rectum, rectosigmoid junction, and anus: Secondary | ICD-10-CM

## 2017-10-22 DIAGNOSIS — Z7902 Long term (current) use of antithrombotics/antiplatelets: Secondary | ICD-10-CM

## 2017-10-22 DIAGNOSIS — M1990 Unspecified osteoarthritis, unspecified site: Secondary | ICD-10-CM | POA: Diagnosis not present

## 2017-10-22 DIAGNOSIS — N179 Acute kidney failure, unspecified: Secondary | ICD-10-CM | POA: Diagnosis not present

## 2017-10-22 DIAGNOSIS — Z87891 Personal history of nicotine dependence: Secondary | ICD-10-CM

## 2017-10-22 DIAGNOSIS — K439 Ventral hernia without obstruction or gangrene: Secondary | ICD-10-CM | POA: Diagnosis not present

## 2017-10-22 DIAGNOSIS — R5382 Chronic fatigue, unspecified: Secondary | ICD-10-CM | POA: Diagnosis not present

## 2017-10-22 DIAGNOSIS — M25552 Pain in left hip: Secondary | ICD-10-CM | POA: Diagnosis not present

## 2017-10-22 DIAGNOSIS — M810 Age-related osteoporosis without current pathological fracture: Secondary | ICD-10-CM | POA: Diagnosis not present

## 2017-10-22 DIAGNOSIS — Z79899 Other long term (current) drug therapy: Secondary | ICD-10-CM

## 2017-10-22 DIAGNOSIS — N189 Chronic kidney disease, unspecified: Secondary | ICD-10-CM

## 2017-10-22 DIAGNOSIS — Z888 Allergy status to other drugs, medicaments and biological substances status: Secondary | ICD-10-CM | POA: Diagnosis not present

## 2017-10-22 DIAGNOSIS — I959 Hypotension, unspecified: Secondary | ICD-10-CM | POA: Diagnosis not present

## 2017-10-22 DIAGNOSIS — I10 Essential (primary) hypertension: Secondary | ICD-10-CM | POA: Diagnosis present

## 2017-10-22 DIAGNOSIS — K5730 Diverticulosis of large intestine without perforation or abscess without bleeding: Secondary | ICD-10-CM | POA: Diagnosis not present

## 2017-10-22 LAB — CBC WITH DIFFERENTIAL/PLATELET
ABS IMMATURE GRANULOCYTES: 0.1 10*3/uL (ref 0.0–0.1)
BASOS ABS: 41 {cells}/uL (ref 0–200)
BASOS PCT: 0 %
Basophils Absolute: 0 10*3/uL (ref 0.0–0.1)
Basophils Relative: 0.4 %
EOS ABS: 122 {cells}/uL (ref 15–500)
Eosinophils Absolute: 0 10*3/uL (ref 0.0–0.7)
Eosinophils Relative: 1.2 %
Eosinophils Relative: 0 %
HCT: 35.8 % — ABNORMAL LOW (ref 36.0–46.0)
HEMATOCRIT: 34.1 % — AB (ref 35.0–45.0)
Hemoglobin: 11.3 g/dL — ABNORMAL LOW (ref 11.7–15.5)
Hemoglobin: 11.2 g/dL — ABNORMAL LOW (ref 12.0–15.0)
IMMATURE GRANULOCYTES: 1 %
LYMPHS ABS: 1591 {cells}/uL (ref 850–3900)
Lymphocytes Relative: 6 %
Lymphs Abs: 0.7 10*3/uL (ref 0.7–4.0)
MCH: 30.4 pg (ref 27.0–33.0)
MCH: 30.5 pg (ref 26.0–34.0)
MCHC: 33.1 g/dL (ref 32.0–36.0)
MCHC: 31.3 g/dL (ref 30.0–36.0)
MCV: 91.7 fL (ref 80.0–100.0)
MCV: 97.5 fL (ref 78.0–100.0)
MONOS PCT: 2 %
MPV: 11.6 fL (ref 7.5–12.5)
Monocytes Absolute: 0.2 10*3/uL (ref 0.1–1.0)
Monocytes Relative: 6.9 %
NEUTROS ABS: 7742 {cells}/uL (ref 1500–7800)
NEUTROS ABS: 9.7 10*3/uL — AB (ref 1.7–7.7)
NEUTROS PCT: 91 %
Neutrophils Relative %: 75.9 %
PLATELETS: 230 10*3/uL (ref 150–400)
Platelets: 243 10*3/uL (ref 140–400)
RBC: 3.72 10*6/uL — ABNORMAL LOW (ref 3.80–5.10)
RBC: 3.67 MIL/uL — ABNORMAL LOW (ref 3.87–5.11)
RDW: 13.6 % (ref 11.0–15.0)
RDW: 13.9 % (ref 11.5–15.5)
Total Lymphocyte: 15.6 %
WBC mixed population: 704 cells/uL (ref 200–950)
WBC: 10.2 10*3/uL (ref 3.8–10.8)
WBC: 10.7 10*3/uL — ABNORMAL HIGH (ref 4.0–10.5)

## 2017-10-22 LAB — URINE CULTURE
MICRO NUMBER:: 90646676
SPECIMEN QUALITY: ADEQUATE

## 2017-10-22 LAB — URINALYSIS, ROUTINE W REFLEX MICROSCOPIC
Bilirubin Urine: NEGATIVE
GLUCOSE, UA: NEGATIVE mg/dL
Hgb urine dipstick: NEGATIVE
Ketones, ur: NEGATIVE mg/dL
LEUKOCYTES UA: NEGATIVE
Nitrite: NEGATIVE
Protein, ur: NEGATIVE mg/dL
Specific Gravity, Urine: 1.014 (ref 1.005–1.030)
pH: 5 (ref 5.0–8.0)

## 2017-10-22 LAB — COMPREHENSIVE METABOLIC PANEL
ALBUMIN: 3.3 g/dL — AB (ref 3.5–5.0)
ALT: 17 U/L (ref 14–54)
AST: 17 U/L (ref 15–41)
Alkaline Phosphatase: 82 U/L (ref 38–126)
Anion gap: 9 (ref 5–15)
BUN: 56 mg/dL — AB (ref 6–20)
CHLORIDE: 108 mmol/L (ref 101–111)
CO2: 22 mmol/L (ref 22–32)
CREATININE: 2.78 mg/dL — AB (ref 0.44–1.00)
Calcium: 8.9 mg/dL (ref 8.9–10.3)
GFR calc Af Amer: 17 mL/min — ABNORMAL LOW (ref >60)
GFR, EST NON AFRICAN AMERICAN: 15 mL/min — AB (ref >60)
Glucose, Bld: 357 mg/dL — ABNORMAL HIGH (ref 65–99)
Potassium: 5.9 mmol/L — ABNORMAL HIGH (ref 3.5–5.1)
SODIUM: 139 mmol/L (ref 135–145)
Total Bilirubin: 0.8 mg/dL (ref 0.3–1.2)
Total Protein: 7 g/dL (ref 6.5–8.1)

## 2017-10-22 LAB — COMPLETE METABOLIC PANEL WITH GFR
AG Ratio: 1.4 (calc) (ref 1.0–2.5)
ALKALINE PHOSPHATASE (APISO): 88 U/L (ref 33–130)
ALT: 14 U/L (ref 6–29)
AST: 15 U/L (ref 10–35)
Albumin: 3.9 g/dL (ref 3.6–5.1)
BILIRUBIN TOTAL: 0.7 mg/dL (ref 0.2–1.2)
BUN/Creatinine Ratio: 21 (calc) (ref 6–22)
BUN: 54 mg/dL — AB (ref 7–25)
CHLORIDE: 106 mmol/L (ref 98–110)
CO2: 24 mmol/L (ref 20–32)
CREATININE: 2.62 mg/dL — AB (ref 0.60–0.88)
Calcium: 8.9 mg/dL (ref 8.6–10.4)
GFR, Est African American: 19 mL/min/{1.73_m2} — ABNORMAL LOW (ref >=60)
GFR, Est Non African American: 16 mL/min/{1.73_m2} — ABNORMAL LOW (ref >=60)
GLUCOSE: 153 mg/dL — AB (ref 65–99)
Globulin: 2.7 g/dL (calc) (ref 1.9–3.7)
Potassium: 5.3 mmol/L (ref 3.5–5.3)
Sodium: 138 mmol/L (ref 135–146)
Total Protein: 6.6 g/dL (ref 6.1–8.1)

## 2017-10-22 LAB — BRAIN NATRIURETIC PEPTIDE: Brain Natriuretic Peptide: 32 pg/mL (ref <100)

## 2017-10-22 LAB — CBG MONITORING, ED: Glucose-Capillary: 253 mg/dL — ABNORMAL HIGH (ref 65–99)

## 2017-10-22 LAB — I-STAT CG4 LACTIC ACID, ED: Lactic Acid, Venous: 1.55 mmol/L (ref 0.5–1.9)

## 2017-10-22 MED ORDER — SODIUM CHLORIDE 0.9 % IV SOLN
2.0000 g | Freq: Once | INTRAVENOUS | Status: AC
  Administered 2017-10-22: 2 g via INTRAVENOUS
  Filled 2017-10-22: qty 20

## 2017-10-22 MED ORDER — MIRABEGRON ER 25 MG PO TB24
50.0000 mg | ORAL_TABLET | Freq: Every day | ORAL | Status: DC
  Administered 2017-10-23 – 2017-10-24 (×2): 50 mg via ORAL
  Filled 2017-10-22 (×2): qty 2

## 2017-10-22 MED ORDER — SODIUM CHLORIDE 0.9 % IV SOLN
INTRAVENOUS | Status: DC
  Administered 2017-10-22 – 2017-10-23 (×3): via INTRAVENOUS
  Administered 2017-10-23 – 2017-10-24 (×2): 100 mL/h via INTRAVENOUS

## 2017-10-22 MED ORDER — SODIUM CHLORIDE 0.9 % IV BOLUS
1000.0000 mL | Freq: Once | INTRAVENOUS | Status: AC
  Administered 2017-10-22: 1000 mL via INTRAVENOUS

## 2017-10-22 MED ORDER — HEPARIN SODIUM (PORCINE) 5000 UNIT/ML IJ SOLN
5000.0000 [IU] | Freq: Three times a day (TID) | INTRAMUSCULAR | Status: DC
  Administered 2017-10-22 – 2017-10-24 (×5): 5000 [IU] via SUBCUTANEOUS
  Filled 2017-10-22 (×4): qty 1

## 2017-10-22 MED ORDER — HYPROMELLOSE (GONIOSCOPIC) 2.5 % OP SOLN
1.0000 [drp] | OPHTHALMIC | Status: DC | PRN
  Filled 2017-10-22: qty 15

## 2017-10-22 MED ORDER — PRAVASTATIN SODIUM 40 MG PO TABS
80.0000 mg | ORAL_TABLET | Freq: Every day | ORAL | Status: DC
  Administered 2017-10-23 – 2017-10-24 (×2): 80 mg via ORAL
  Filled 2017-10-22 (×2): qty 2

## 2017-10-22 MED ORDER — ACETAMINOPHEN 650 MG RE SUPP: 650.0000 mg | Freq: Four times a day (QID) | RECTAL | Status: DC | PRN

## 2017-10-22 MED ORDER — INSULIN ASPART 100 UNIT/ML ~~LOC~~ SOLN
0.0000 [IU] | Freq: Three times a day (TID) | SUBCUTANEOUS | Status: DC
  Administered 2017-10-23 – 2017-10-24 (×4): 1 [IU] via SUBCUTANEOUS

## 2017-10-22 MED ORDER — CLOPIDOGREL BISULFATE 75 MG PO TABS
75.0000 mg | ORAL_TABLET | Freq: Every day | ORAL | Status: DC
  Administered 2017-10-23 – 2017-10-24 (×2): 75 mg via ORAL
  Filled 2017-10-22 (×2): qty 1

## 2017-10-22 MED ORDER — ACETAMINOPHEN 325 MG PO TABS: 650.0000 mg | ORAL_TABLET | Freq: Four times a day (QID) | ORAL | Status: DC | PRN

## 2017-10-22 MED ORDER — ONDANSETRON HCL 4 MG/2ML IJ SOLN
4.0000 mg | Freq: Three times a day (TID) | INTRAMUSCULAR | Status: DC | PRN
Start: 2017-10-22 — End: 2017-10-24

## 2017-10-22 MED ORDER — ONDANSETRON HCL 4 MG PO TABS: 4.0000 mg | ORAL_TABLET | Freq: Three times a day (TID) | ORAL | Status: DC | PRN

## 2017-10-22 NOTE — ED Triage Notes (Signed)
Pt arrives via POV sent over by MD office due to UTI, worsening kidney function and generalized weakness. Pt awake, alert, appropriate. VSS/

## 2017-10-22 NOTE — ED Provider Notes (Addendum)
[facility] EMERGENCY DEPARTMENT Provider Note   CSN: [id number] Arrival date & time: 10/22/17  1011     History   Chief Complaint Chief Complaint  Patient presents with  . Abnormal Lab    HPI Christine Zavala is a 84 y.o. female.  HPI   Patient is an 84 year old female presenting with known urinary tract infection, increasing weakness, fatigue.  Patient reports that she developed some weakness and fatigue went to her primary care physician and urine showed infection.  She was started on Keflex.  Which she was only able to take 1 dose today and felt too fatigued to continue on at home.  Patient reports that she has been seeing Dr. Trevor Iha from urology recently.  This is for urinary frequency/urgency.  Had been started on trimethoprim daily and has been taking that as well.  Patient reports mild nausea, no vomiting, increasing fatigue, pallor.  Patient reports that  she has left hip/back pain.  She was started on prednisone by her primary care physician for this yesterday.  However on exam it appears that more CVA tenderness, more typical for pyelonephritis and for joint issue.      Past Medical History:  Diagnosis Date  . Cancer (HCC)    Rectal  . Cataract   . Dementia   . Diabetes mellitus without complication ([place])   . DVT (deep venous thrombosis) ([place])   . H/O vitamin D deficiency   . High cholesterol   . Hyperlipidemia   . Hypertension   . Osteoporosis   . Stroke [facility])     Patient Active Problem List   Diagnosis Date Noted  . UTI (urinary tract infection) 10/22/2017  . Acute on chronic renal failure ([place]) 10/22/2017  . Generalized weakness 05/16/2016  . Vertigo 06/27/2015  . Abnormality of gait 04/12/2015  . Constipation 08/21/2014  . Itchy scalp 08/21/2014  . Osteoarthritis 03/06/2014  . Peripheral neuropathy 03/06/2014  . Varicose veins 03/06/2014  . DDD (degenerative disc disease), lumbar 11/29/2013  . Renal cyst 11/29/2013  . OAB  (overactive bladder) 08/24/2013  . Osteoporosis, unspecified   . Peripheral edema 11/15/2012  . History of rectal cancer   . Hyperlipidemia   . Dementia   . History of CVA (cerebrovascular accident)   . Hypertension   . Type 2 diabetes mellitus with diabetic chronic kidney disease [facility])     Past Surgical History:  Procedure Laterality Date  . CATARACT EXTRACTION, BILATERAL     2001  . COLON SURGERY     2004  . GALLBLADDER SURGERY     2001  . HERNIA REPAIR     2004  . ovary removed     2002     OB History   None      Home Medications    Prior to Admission medications   Medication Sig Start Date End Date Taking? Authorizing Provider  acetaminophen (TYLENOL) 500 MG tablet Take 1,000 mg by mouth as needed for mild pain.   Yes [provider]  cephALEXin (KEFLEX) 500 MG capsule Take 1 capsule (500 mg total) by mouth 3 (three) times daily. 10/21/17  Yes Delsa Grana, PA-C  clopidogrel (PLAVIX) 75 MG tablet TAKE 1 TABLET BY MOUTH EVERY DAY 02/20/17  Yes [place], Modena Nunnery, MD  furosemide (LASIX) 40 MG tablet TAKE 1 TABLET BY MOUTH  DAILY AS NEEDED 10/20/17  Yes [place], Modena Nunnery, MD  glimepiride (AMARYL) 1 MG tablet TAKE 1 TABLET BY MOUTH  DAILY WITH BREAKFAST 10/20/17  Yes [place], Modena Nunnery, MD  HYDROcodone-acetaminophen [facility]) 5-325 MG tablet Take 1 tablet by mouth 2 (two) times daily as needed for up to 3 days for severe pain. 10/21/17 10/24/17 Yes Delsa Grana, PA-C  hydroxypropyl methylcellulose / hypromellose (ISOPTO TEARS / GONIOVISC) 2.5 % ophthalmic solution Place 1 drop into both eyes as needed for dry eyes.   Yes [provider]  [place] [facility]) CREA Apply to affected areas three times a day as needed 11/24/16  Yes [place], Modena Nunnery, MD  lisinopril (PRINIVIL,ZESTRIL) 40 MG tablet TAKE 1 TABLET EVERY DAY 10/20/17  Yes [place], Modena Nunnery, MD  Menthol (COUGH DROPS) 5.8 MG LOZG Use as directed 1 lozenge in the mouth or throat as needed (cough and  congestion).   Yes [provider]  MYRBETRIQ 50 MG TB24 tablet  10/20/17  Yes [provider]  pravastatin (PRAVACHOL) 80 MG tablet Take 1 tablet (80 mg total) by mouth daily. 05/06/17  Yes [place], Modena Nunnery, MD  predniSONE (DELTASONE) 10 MG tablet Take 10 mg by mouth See admin instructions. Take 4 tablets for 3 days, then 2 tablets for 3 days 10/21/17 10/28/17 Yes [provider]  trimethoprim (TRIMPEX) 100 MG tablet Take 100 mg by mouth daily.  07/14/17  Yes [provider]  meloxicam (MOBIC) 15 MG tablet TAKE 1 TABLET BY MOUTH  DAILY 07/21/17   Alycia Rossetti, MD  predniSONE (DELTASONE) 20 MG tablet 2 tabs poqday 1-3, 1 tabs poqday 4-6 Patient not taking: Reported on 10/22/2017 10/21/17   Delsa Grana, PA-C    Family History Family History  Problem Relation Age of Onset  . Cancer Mother        Pancreatic  . COPD Mother   . Dementia Father     Social History Social History   Tobacco Use  . Smoking status: Former Research scientist (life sciences)  . Smokeless tobacco: Never Used  . Tobacco comment: quit 1970's  Substance Use Topics  . Alcohol use: No  . Drug use: No     Allergies   Aspirin; Calcium-containing compounds; Codeine sulfate; Lipitor [atorvastatin]; Zocor [simvastatin]; and Zyrtec [cetirizine]   Review of Systems Review of Systems   Physical Exam Updated Vital Signs BP 124/63   Pulse 77   Temp 98.2 F (36.8 C) (Oral)   Resp (!) 22   Ht 5\' 2"  (1.575 m)   Wt 120.2 kg (265 lb)   SpO2 96%   BMI 48.47 kg/m   Physical Exam  Constitutional: She is oriented to person, place, and time. She appears well-developed and well-nourished.  HENT:  Head: Normocephalic and atraumatic.  Eyes: Right eye exhibits no discharge. Left eye exhibits no discharge.  Cardiovascular: Normal rate and regular rhythm.  Pulmonary/Chest: Effort normal and breath sounds normal. No respiratory distress.  Abdominal: Soft. She exhibits no distension. There is no tenderness.    Musculoskeletal:  CVA tenderness  Neurological: She is oriented to person, place, and time.  Skin: Skin is warm and dry. She is not diaphoretic.  Psychiatric: She has a normal mood and affect.  Nursing note and vitals reviewed.    ED Treatments / Results  Labs (all labs ordered are listed, but only abnormal results are displayed) Labs Reviewed  CBC WITH DIFFERENTIAL/PLATELET - Abnormal; Notable for the following components:      Result Value   WBC 10.7 (*)    RBC 3.67 (*)    Hemoglobin 11.2 (*)    HCT 35.8 (*)    Neutro Abs  9.7 (*)    All other components within normal limits  COMPREHENSIVE METABOLIC PANEL - Abnormal; Notable for the following components:   Potassium 5.9 (*)    Glucose, Bld 357 (*)    BUN 56 (*)    Creatinine, Ser 2.78 (*)    Albumin 3.3 (*)    GFR calc non Af Amer 15 (*)    GFR calc Af Amer 17 (*)    All other components within normal limits  CBG MONITORING, ED - Abnormal; Notable for the following components:   Glucose-Capillary 253 (*)    All other components within normal limits  URINE CULTURE  URINALYSIS, ROUTINE W REFLEX MICROSCOPIC  BASIC METABOLIC PANEL  BASIC METABOLIC PANEL  I-STAT CG4 LACTIC ACID, ED    EKG EKG Interpretation  Date/Time:  Thursday Oct 22 2017 15:08:15 EDT Ventricular Rate:  68 PR Interval:    QRS Duration: 138 QT Interval:  426 QTC Calculation: 454 R Axis:   1 Text Interpretation:  Sinus rhythm Right bundle branch block Normal sinus rhythm Confirmed by Thomasene Lot, [place] [phone]) on 10/22/2017 5:00:03 PM   Radiology Ct Renal Stone Study  Result Date: 10/22/2017 CLINICAL DATA:  Urinary tract infection. Progressive decrease in kidney function. Generalized weakness. Flank pain. Stone disease suspected. EXAM: CT ABDOMEN AND PELVIS WITHOUT CONTRAST TECHNIQUE: Multidetector CT imaging of the abdomen and pelvis was performed following the standard protocol without IV contrast. COMPARISON:  CT abdomen and pelvis 07/08/2017  at [facility] specialist FINDINGS: Lower chest: The lung bases are clear without airspace disease. No significant pleural or pericardial effusion is present. Heart size is normal. Hepatobiliary: No focal liver abnormality is seen. Status post cholecystectomy. No biliary dilatation. Pancreas: Unremarkable. No pancreatic ductal dilatation or surrounding inflammatory changes. Spleen: Normal in size without focal abnormality. Adrenals/Urinary Tract: Adrenal glands are normal. The low-density lesion at the lower pole of the left kidney is stable. There is no nephrolithiasis. No obstructive uropathy is present. Ureters and urinary bladder are within normal limits. Stomach/Bowel: The stomach and duodenum are within normal limits. A loop of small bowel herniates into the supraumbilical hernia the left. Bowel proximal to the hernia is of normal caliber. Bowel distal to the hernia is partially collapsed. Ventral Megill adhesion is noted. Bowel anastomosis is intact. Terminal ileum is mostly collapsed. Ascending transverse colon are within normal limits. Descending colon is unremarkable. Diverticular changes are present in the sigmoid colon without focal inflammation to suggest diverticulitis. Vascular/Lymphatic: Atherosclerotic calcifications are present. There is no aneurysm. Reproductive: Uterus and adnexa are within normal limits. Other: No significant free fluid is present. Ventral hernia is as described above. Musculoskeletal: Vacuum disc disease is again noted in the lumbar spine. Mild endplate changes are present. Vertebral body heights and alignment are maintained. No focal lytic blastic lesions present. Bony pelvis is intact. Hips are located and within normal limits. IMPRESSION: 1. Supraumbilical hernia contains a loop of small bowel. Small bowel is of normal caliber proximal to the hernia but decreased caliber distal. This could represent some degree of obstruction. No dilated bowel is present. 2. Previous  small bowel surgery.  Anastomosis is intact. 3. Sigmoid diverticulosis without diverticulitis. 4.  Aortic Atherosclerosis (ICD10-I70.0). 5. Stable multilevel degenerative changes of the lumbar spine. Electronically Signed   By: San Morelle M.D.   On: 10/22/2017 16:35    Procedures Procedures (including critical care time)  CRITICAL CARE Performed by: Gardiner Sleeper Total critical care time: 45 minutes Critical care time was exclusive of separately  billable procedures and treating other patients. Critical care was necessary to treat or prevent imminent or life-threatening deterioration. Critical care was time spent personally by me on the following activities: development of treatment plan with patient and/or surrogate as well as nursing, discussions with consultants, evaluation of patient's response to treatment, examination of patient, obtaining history from patient or surrogate, ordering and performing treatments and interventions, ordering and review of laboratory studies, ordering and review of radiographic studies, pulse oximetry and re-evaluation of patient's condition.   Medications Ordered in ED Medications  insulin aspart (novoLOG) injection 0-9 Units (has no administration in time range)  ondansetron (ZOFRAN) injection 4 mg (has no administration in time range)    Or  ondansetron (ZOFRAN) tablet 4 mg (has no administration in time range)  clopidogrel (PLAVIX) tablet 75 mg (has no administration in time range)  hydroxypropyl methylcellulose / hypromellose (ISOPTO TEARS / GONIOVISC) 2.5 % ophthalmic solution 1 drop (has no administration in time range)  mirabegron ER (MYRBETRIQ) tablet 50 mg (has no administration in time range)  pravastatin (PRAVACHOL) tablet 80 mg (has no administration in time range)  heparin injection 5,000 Units (5,000 Units Subcutaneous Given 10/22/17 2246)  acetaminophen (TYLENOL) tablet 650 mg (has no administration in time range)    Or   acetaminophen (TYLENOL) suppository 650 mg (has no administration in time range)  0.9 %  sodium chloride infusion ( Intravenous New Bag/Given 10/22/17 2042)  sodium chloride 0.9 % bolus 1,000 mL (0 mLs Intravenous Stopped 10/22/17 1734)  cefTRIAXone (ROCEPHIN) 2 g in sodium chloride 0.9 % 100 mL IVPB (0 g Intravenous Stopped 10/22/17 1733)     Initial Impression / Assessment and Plan / ED Course  I have reviewed the triage vital signs and the nursing notes.  Pertinent labs & imaging results that were available during my care of the patient were reviewed by me and considered in my medical decision making (see chart for details).    Patient is an 84 year old female presenting with known urinary tract infection, increasing weakness, fatigue.  Patient reports that she developed some weakness and fatigue went to her primary care physician and urine showed infection.  She was started on Keflex.  Which she was only able to take 1 dose today and felt too fatigued to continue on at home.  Patient reports that she has been seeing Dr. Trevor Iha from urology recently.  This is for urinary frequency/urgency.  Had been started on trimethoprim daily and has been taking that as well.  Patient reports mild nausea, no vomiting, increasing fatigue, pallor.  11:37 PM  Patient also reports that  she has left hip/back pain.  She was started on prednisone by her primary care physician for this yesterday.  However on exam it appears that more CVA tenderness, more typical for pyelonephritis than for a joint issue.    Patient had at least 1 hypotensive blood pressure here in the emergency department.  Will order lactic, hydration.  Will treat with broad spectrum abx.   Final Clinical Impressions(s) / ED Diagnoses   Final diagnoses:  None    ED Discharge Orders    None       Macarthur Critchley, MD 10/22/17 2338    Macarthur Critchley, MD 11/02/17 2314

## 2017-10-22 NOTE — Progress Notes (Signed)
Lab work is concerning for a acute kidney injury.  It may be secondary to UTI, but with Ms. Mizrachi's age, and multiple pain complaints, hips, back, urinary (?), we need her to go to the ER for admission for further work up, possibly stat imaging, IV fluids, and treatment to make sure that they find the cause of her worsening kidney function and weakness, and make sure her labs improve.  Please relay this to pt (her son or daughter) and let me know which hospital they would like to go to, I will call and give report.

## 2017-10-22 NOTE — H&P (Addendum)
Date: 10/22/2017               Patient Name:  Christine Zavala MRN: [id number]  DOB: 1934-01-29 Age / Sex: 84 y.o., female   PCP: Alycia Rossetti, MD         Medical Service: Internal Medicine Teaching Service         Attending Physician: Dr. Rebeca Alert Raynaldo Opitz, MD    First Contact: Dr. Aggie Hacker Pager: [phone]  Second Contact: Dr. Hetty Ely Pager: [phone]       After Hours (After 5p/  First Contact Pager: [phone]  weekends / holidays): Second Contact Pager: [phone]   Chief Complaint: AKI, Fatigue  History of Present Illness: [name] is an 84 yo F with Hx of T2DM, HTN, Overactive Bladder, Rectal Ca (s/p resection in 2003/2004), CVA, Dementia, and OA who presented with AKI and Fatigue. Patient accompanied by her daughter and history obtained with her assistance. Patient seen in her PCPs office the day prior to admission for worsening of her chronic hip and back pain. A UA at that visit showed an UTI and patient began treatment with keflex. Today the patient was contacted regarding lab results from her visit showing AKI (Cr 2.62) and instructed to come to the ED for fluids and further workup. Patient denies any recent increased urinary frequency (she has chronic overactive bladder and always urinates frequently, but has not noticed a change), dysuria, or odor. She does endorse increased fatigue for the past year with worsening for the past week. Her hip pain has been worse for the pat 4 months or so and alternates from one hip to the other and is worse with standing. She typically only take tylenol for this, but has taken Advil occassionally. She endorses some light headedness and nausea (today after scan). She denies fevers, chills, chest pain, SOB, Changes in bowl function, or vomiting.  In the ED, vital signs significant for hypotension initially (Systolic as low as 89), which improved with IVF. CBC showed WBC 10.7 (though started steroid recently), Hgb 11.2 (stable); CMP showed K 5.9, BUN 56, Cr  2.78; UA was WNL (improved from her office visit the day prior); LA WNL, Ur Culture was obtained. EKG showed sinus rhythm with old RBBB. CT Renal Stone Study was negative for acute renal disease and showed supraumbilical hernia containing a loop of small bowl w/o dilation, Evidence of prior surgery, diverticulosis, and stable degenerative changes of lumbar spine. Patient received Ceftriaxone and 1L IVF. She is to be admitted for further workup and care.  Meds:  Current Meds  Medication Sig  . acetaminophen (TYLENOL) 500 MG tablet Take 1,000 mg by mouth as needed for mild pain.  . cephALEXin (KEFLEX) 500 MG capsule Take 1 capsule (500 mg total) by mouth 3 (three) times daily.  . clopidogrel (PLAVIX) 75 MG tablet TAKE 1 TABLET BY MOUTH EVERY DAY  . furosemide (LASIX) 40 MG tablet TAKE 1 TABLET BY MOUTH  DAILY AS NEEDED  . glimepiride (AMARYL) 1 MG tablet TAKE 1 TABLET BY MOUTH  DAILY WITH BREAKFAST  . HYDROcodone-acetaminophen ([place]) 5-325 MG tablet Take 1 tablet by mouth 2 (two) times daily as needed for up to 3 days for severe pain.  . hydroxypropyl methylcellulose / hypromellose (ISOPTO TEARS / GONIOVISC) 2.5 % ophthalmic solution Place 1 drop into both eyes as needed for dry eyes.  . [place] ([place]) CREA Apply to affected areas three times a day as needed  . lisinopril (PRINIVIL,ZESTRIL) 40  MG tablet TAKE 1 TABLET EVERY DAY  . Menthol (COUGH DROPS) 5.8 MG LOZG Use as directed 1 lozenge in the mouth or throat as needed (cough and congestion).  . MYRBETRIQ 50 MG TB24 tablet   . pravastatin (PRAVACHOL) 80 MG tablet Take 1 tablet (80 mg total) by mouth daily.  . predniSONE (DELTASONE) 10 MG tablet Take 10 mg by mouth See admin instructions. Take 4 tablets for 3 days, then 2 tablets for 3 days  . trimethoprim (TRIMPEX) 100 MG tablet Take 100 mg by mouth daily.    Allergies: Allergies as of 10/22/2017 - Review Complete 10/22/2017  Allergen Reaction Noted  . Aspirin Nausea And  Vomiting 06/23/2011  . Calcium-containing compounds  08/18/2013  . Codeine sulfate Nausea And Vomiting 06/23/2011  . Lipitor [atorvastatin]  08/18/2013  . Zocor [simvastatin]  08/18/2013  . Zyrtec [cetirizine]  08/21/2014   Past Medical History:  Diagnosis Date  . Cancer (HCC)    Rectal  . Cataract   . Dementia   . Diabetes mellitus without complication ([place])   . DVT (deep venous thrombosis) ([place])   . H/O vitamin D deficiency   . High cholesterol   . Hyperlipidemia   . Hypertension   . Osteoporosis   . Stroke [facility])     Family History: Family History  Problem Relation Age of Onset  . Cancer Mother        Pancreatic  . COPD Mother   . Dementia Father   - Reviewed on admission  Social History:  Social History   Tobacco Use  . Smoking status: Former Research scientist (life sciences)  . Smokeless tobacco: Never Used  . Tobacco comment: quit 1970's  Substance Use Topics  . Alcohol use: No  . Drug use: No  - Reviewed on Admission  Review of Systems: A complete ROS was negative except as per HPI.  Physical Exam: Blood pressure (!) 110/49, pulse 69, temperature 98.2 F (36.8 C), temperature source Oral, resp. rate (!) 29, height 5\' 2"  (1.575 m), weight 265 lb (120.2 kg), SpO2 98 %. Physical Exam  Constitutional: She is oriented to person, place, and time. She appears well-developed and well-nourished. No distress.  HENT:  Head: Normocephalic and atraumatic.  Eyes: EOM are normal. Right eye exhibits no discharge. Left eye exhibits no discharge.  Cardiovascular: Normal rate, regular rhythm, normal heart sounds and intact distal pulses.  Pulmonary/Chest: Effort normal and breath sounds normal. No respiratory distress.  Abdominal: Soft. Bowel sounds are normal. She exhibits no distension. There is no tenderness.  Obese abdomen  Musculoskeletal: She exhibits no edema or deformity.  Good range on motion of bilateral Hip joints. No pain with flex/extrension or rotation of hip joints  Neurological:  She is alert and oriented to person, place, and time.  Strength and sensation grossly intact bilateral LEs  Skin: Skin is warm and dry.   EKG: personally reviewed my interpretation is sinus rhythm with old RBBB  CT Renal Stone Study: IMPRESSION: 1. Supraumbilical hernia contains a loop of small bowel. Small bowel is of normal caliber proximal to the hernia but decreased caliber distal. This could represent some degree of obstruction. No dilated bowel is present. 2. Previous small bowel surgery.  Anastomosis is intact. 3. Sigmoid diverticulosis without diverticulitis. 4.  Aortic Atherosclerosis (ICD10-I70.0). 5. Stable multilevel degenerative changes of the lumbar spine.  Assessment & Plan by Problem: 84 yo F with Hx of T2DM, HTN, Overactive Bladder, Rectal Ca (s/p resection in 2003/2004), CVA, Dementia, and OA who presented  with AKI and Fatigue.  AKI on CKD II: Patient noted to have Cr 2.62 at PCP office and instructed to come to ED. She has a history of CKD II, which appears to have progressed to CKD III based on labs from second half of 2018 and first half of 2019; Her baseline Cr in that period has been 1.3-1.6. Patient has chronic overactive bladder and follows with [facility]. AKI appears pre-renal. > Received 1L IVF in ED - NS @ 100cc/hr - AM BMP  Hyperkalemia: K 5.9 on admission. Received 1L IVF in ED. Will recheck K response to hydration, would like to avoid Lasix and Kayexelate given AKI. - NS @ 100cc/hr - Repeat BMP  ?UTI: Patient has a history of chronic overactive bladder (on Myrbetriq samples, that have been working for her) and recurrent UTIs. She was started on Keflex the day prior to admission for bacteruria/pyriuria without symptoms. She continue to deny urinary symptoms. Patient does have WBC 10.7 but this may be 2/2 starting steroids.  > Repeat UA WNL > Received Ceftriaxone in ED - Will hold further Antibiotics as patient is asymptomatic and had normal repeat  UA  Chronic Fatigue: Patient and daughter report 1 year of chronic fatigue, worse in the past 1 week. Patient may be experiencing deconditioning with age with worsening in the setting of hypotension and ?UTI.  - PT/OT Eval and treat  Diabetes: Last A1c: 8.1 (07/29/17). Takes Glimepiride 1mg  Daily at home. - SSI-S  HTN: Hypotensive to Normotensive here. On Lasix 40mg  daily and lisinopril 20mg  Daily at home. - Hold home Lisinopril and Lasix in the setting of AKI and soft BP  FEN: HH/CM diet VTE ppx: Heparin Code Status: FULL  Dispo: Admit patient to Observation with expected length of stay less than 2 midnights.  Signed: Neva Seat, MD 10/22/2017, 9:51 PM  Pager: [phone]

## 2017-10-23 ENCOUNTER — Other Ambulatory Visit: Payer: Self-pay

## 2017-10-23 DIAGNOSIS — Z8673 Personal history of transient ischemic attack (TIA), and cerebral infarction without residual deficits: Secondary | ICD-10-CM

## 2017-10-23 DIAGNOSIS — Z87891 Personal history of nicotine dependence: Secondary | ICD-10-CM

## 2017-10-23 DIAGNOSIS — K439 Ventral hernia without obstruction or gangrene: Secondary | ICD-10-CM

## 2017-10-23 DIAGNOSIS — R5382 Chronic fatigue, unspecified: Secondary | ICD-10-CM

## 2017-10-23 DIAGNOSIS — Z810 Family history of intellectual disabilities: Secondary | ICD-10-CM

## 2017-10-23 DIAGNOSIS — F0390 Unspecified dementia without behavioral disturbance: Secondary | ICD-10-CM

## 2017-10-23 DIAGNOSIS — Z886 Allergy status to analgesic agent status: Secondary | ICD-10-CM

## 2017-10-23 DIAGNOSIS — Z87440 Personal history of urinary (tract) infections: Secondary | ICD-10-CM

## 2017-10-23 DIAGNOSIS — Z885 Allergy status to narcotic agent status: Secondary | ICD-10-CM

## 2017-10-23 DIAGNOSIS — I4510 Unspecified right bundle-branch block: Secondary | ICD-10-CM

## 2017-10-23 DIAGNOSIS — E875 Hyperkalemia: Secondary | ICD-10-CM

## 2017-10-23 DIAGNOSIS — M1990 Unspecified osteoarthritis, unspecified site: Secondary | ICD-10-CM

## 2017-10-23 DIAGNOSIS — Z79899 Other long term (current) drug therapy: Secondary | ICD-10-CM

## 2017-10-23 DIAGNOSIS — N179 Acute kidney failure, unspecified: Principal | ICD-10-CM

## 2017-10-23 DIAGNOSIS — Z7984 Long term (current) use of oral hypoglycemic drugs: Secondary | ICD-10-CM

## 2017-10-23 DIAGNOSIS — N183 Chronic kidney disease, stage 3 (moderate): Secondary | ICD-10-CM

## 2017-10-23 DIAGNOSIS — M25552 Pain in left hip: Secondary | ICD-10-CM

## 2017-10-23 DIAGNOSIS — E1122 Type 2 diabetes mellitus with diabetic chronic kidney disease: Secondary | ICD-10-CM

## 2017-10-23 DIAGNOSIS — N3946 Mixed incontinence: Secondary | ICD-10-CM

## 2017-10-23 DIAGNOSIS — M25551 Pain in right hip: Secondary | ICD-10-CM

## 2017-10-23 DIAGNOSIS — N3281 Overactive bladder: Secondary | ICD-10-CM

## 2017-10-23 DIAGNOSIS — I129 Hypertensive chronic kidney disease with stage 1 through stage 4 chronic kidney disease, or unspecified chronic kidney disease: Secondary | ICD-10-CM

## 2017-10-23 DIAGNOSIS — Z888 Allergy status to other drugs, medicaments and biological substances status: Secondary | ICD-10-CM

## 2017-10-23 DIAGNOSIS — M549 Dorsalgia, unspecified: Secondary | ICD-10-CM

## 2017-10-23 DIAGNOSIS — Z791 Long term (current) use of non-steroidal anti-inflammatories (NSAID): Secondary | ICD-10-CM

## 2017-10-23 DIAGNOSIS — G8929 Other chronic pain: Secondary | ICD-10-CM

## 2017-10-23 DIAGNOSIS — Z85048 Personal history of other malignant neoplasm of rectum, rectosigmoid junction, and anus: Secondary | ICD-10-CM

## 2017-10-23 LAB — GLUCOSE, CAPILLARY
Glucose-Capillary: 141 mg/dL — ABNORMAL HIGH (ref 65–99)
Glucose-Capillary: 123 mg/dL — ABNORMAL HIGH (ref 65–99)
Glucose-Capillary: 105 mg/dL — ABNORMAL HIGH (ref 65–99)
Glucose-Capillary: 115 mg/dL — ABNORMAL HIGH (ref 65–99)

## 2017-10-23 LAB — URINE CULTURE: Culture: NO GROWTH

## 2017-10-23 LAB — BASIC METABOLIC PANEL
Anion gap: 10 (ref 5–15)
BUN: 53 mg/dL — ABNORMAL HIGH (ref 6–20)
CO2: 21 mmol/L — ABNORMAL LOW (ref 22–32)
Calcium: 8.7 mg/dL — ABNORMAL LOW (ref 8.9–10.3)
Chloride: 109 mmol/L (ref 101–111)
Creatinine, Ser: 2.43 mg/dL — ABNORMAL HIGH (ref 0.44–1.00)
GFR calc Af Amer: 20 mL/min — ABNORMAL LOW (ref >60)
GFR calc non Af Amer: 18 mL/min — ABNORMAL LOW (ref >60)
Glucose, Bld: 220 mg/dL — ABNORMAL HIGH (ref 65–99)
Potassium: 4.8 mmol/L (ref 3.5–5.1)
Sodium: 140 mmol/L (ref 135–145)

## 2017-10-23 LAB — TSH: TSH: 2.828 u[IU]/mL (ref 0.350–4.500)

## 2017-10-23 NOTE — Progress Notes (Signed)
   Subjective:   Patient doing well this morning. She denies recent medication changes or NSAID use. Started medication for overactive bladder recently (Myrbetriq). Took one dose of Keflex for possible UTI. Denies acute illnesses or dehydration.   Objective:  Vital signs in last 24 hours: Vitals:   10/22/17 2230 10/22/17 2330 10/23/17 0016 10/23/17 0751  BP: 124/63 126/89 121/63 (!) (P) 119/54  Pulse: 77 84 77 (!) (P) 58  Resp: (!) 22 (!) 24 18 (P) 18  Temp:   98 F (36.7 C) (P) 97.7 F (36.5 C)  TempSrc:   Oral (P) Oral  SpO2: 96% 97% 99% (P) 100%  Weight:   280 lb 6.8 oz (127.2 kg)   Height:   5\' 2"  (1.575 m)    General: Laying in bed comfortably, NAD HEENT: [place]/AT, EOMI, no scleral icterus, PERRL Cardiac: RRR, No R/M/G appreciated Pulm: normal effort, CTAB Abd: soft, non tender, non distended, BS normal Ext: extremities well perfused, no peripheral edema Neuro: alert and oriented X3, cranial nerves II-XII grossly intact   Assessment/Plan:  Principal Problem:   Acute on chronic renal failure (HCC) Active Problems:   UTI (urinary tract infection)  AKI on CKD II Patient noted to have Cr 2.62 at PCP office and instructed to come to ED. Baseline Cr in 1.3-1.6. AKI appears pre-renal. CT renal study was unremarkable: no nephrolithiasis, no obstructive uropathy, Ureters and urinary bladder are within normal limits. UA unremarkable. Unclear etiology, but likely pre-renal.  -Continue NS @ 100cc/hr -Repeat BMP in AM -Ucx pending   Hyperkalemia  K 5.9 on admission. Resolved. K 4.8 on repeat labs.  - NS @ 100cc/hr - Will continue to monitor BMP daily   ?UTI Patient has a history of chronic overactive bladder (on Myrbetriq samples, that have been working for her) and recurrent UTIs. She was started on Keflex the day prior to admission for bacteruria/pyriuria without symptoms. She continue to deny urinary symptoms. Urine cx from recent outpatient visit was negative. UA was  unremarkable. Received ceftriaxone in ED on arrival.   - Will hold further antibiotics - Will f/u repeat urine culture   Chronic Fatigue Patient and daughter report 1 year of chronic fatigue, worse in the past 1 week.  - PT/OT Eval and treat  Diabetes Last A1c: 8.1 (07/29/17). BG 141 this AM. Meeting inpatient goals.  - CBG monitoring TID with meals and night - SSI-Sensitive coverage   HTN Normotensive, most recent BP 121/63. On Lasix 40mg  daily and lisinopril 20mg  Daily at home. - Hold home Lisinopril and Lasix in the setting of AKI and soft BP  FEN: HH/CM diet VTE ppx: Heparin Code Status: FULL   Dispo: Anticipated discharge in approximately 1-2 day(s).   Melanee Spry, MD 10/23/2017, 10:53 AM Pager: [phone]

## 2017-10-23 NOTE — Evaluation (Addendum)
Occupational Therapy Evaluation Patient Details Name: Christine Zavala MRN: [id number] DOB: 04/22/1934 Today's Date: 10/23/2017    History of Present Illness 84 yo F with Hx of T2DM, HTN, Overactive Bladder, Rectal Ca (s/p resection in 2003/2004), CVA, Dementia, and OA who presented with AKI and Fatigue   Clinical Impression   This 84 y/o female presents with the above. No family present at time of eval, though pt reporting she lives with her son, completes functional mobility (mostly household distances) using SPC, reports she receives assist from her son for tub transfers and daughter intermittently assists with bathing ADLs. Pt completing room level functional mobility using SPC, toileting and standing grooming ADLs with MinA this session, requiring modA for LB dressing; presenting with overall generalized weakness. Pt will benefit from continued acute OT services and recommend follow-up HHOT services to maximize her overall safety and independence with ADLs and functional mobility.     Follow Up Recommendations  Home health OT;Supervision/Assistance - 24 hour;Other (comment)(pending progress and available family assist, may not require HHOT at time of d/c)    Equipment Recommendations  None recommended by OT;Other (comment)(pt's DME needs are met )           Precautions / Restrictions Precautions Precautions: Fall Precaution Comments: pt reports approx 3 falls in the past year (2 inside the house, 1 in the yard) Restrictions Weight Bearing Restrictions: No      Mobility Bed Mobility Overal bed mobility: Needs Assistance Bed Mobility: Supine to Sit;Sit to Supine     Supine to sit: HOB elevated;Min assist Sit to supine: Min assist   General bed mobility comments: light minA to support trunk when transitioning into upright sitting position, increased time and effort; minA to guide LEs onto EOB when returning to supine   Transfers Overall transfer level: Needs assistance Equipment  used: Straight cane Transfers: Sit to/from Stand Sit to Stand: Min assist         General transfer comment: assist to rise and steady; pt stood from EOB and BSC     Balance Overall balance assessment: Needs assistance Sitting-balance support: Feet supported Sitting balance-Leahy Scale: Good     Standing balance support: Single extremity supported;No upper extremity supported;During functional activity Standing balance-Leahy Scale: Fair Standing balance comment: pt able to maintain static standing without UE support and minguard for safety; use of single UE support and minA for dynamic mobility                            ADL either performed or assessed with clinical judgement   ADL Overall ADL's : Needs assistance/impaired Eating/Feeding: Modified independent;Sitting   Grooming: Wash/dry hands;Min guard;Minimal assistance;Standing   Upper Body Bathing: Min guard;Sitting   Lower Body Bathing: Minimal assistance;Sit to/from stand   Upper Body Dressing : Min guard;Sitting   Lower Body Dressing: Moderate assistance;Sit to/from stand Lower Body Dressing Details (indicate cue type and reason): assist to thread mesh underwear over LEs, pt able to initiate though having difficulty completing task without assist; min steadying assist in standing while pt advances over hips  Toilet Transfer: Minimal assistance;Ambulation;BSC Toilet Transfer Details (indicate cue type and reason): BSC in bathroom; use of SPC and minA  Toileting- Clothing Manipulation and Hygiene: Minimal assistance;Sit to/from stand Toileting - Clothing Manipulation Details (indicate cue type and reason): min steadying assist provided in standing while pt performs peri-care and clothing management      Functional mobility during ADLs: Minimal assistance;Cane General  ADL Comments: pt completing bed mobility, LB dressing, and room level functional mobility using SPC ambulating to bathroom to use BSC as pt  often reports urgency with urination during mobility; pt washing hands standing at sink prior to ambulating around other side of bed to return to EOB      Vision Baseline Vision/History: Wears glasses Wears Glasses: At all times Vision Assessment?: No apparent visual deficits                Pertinent Vitals/Pain Pain Assessment: Faces Faces Pain Scale: No hurt Pain Intervention(s): Monitored during session     Hand Dominance Right   Extremity/Trunk Assessment Upper Extremity Assessment Upper Extremity Assessment: Overall WFL for tasks assessed   Lower Extremity Assessment Lower Extremity Assessment: Defer to PT evaluation       Communication Communication Communication: No difficulties   Cognition Arousal/Alertness: Awake/alert Behavior During Therapy: WFL for tasks assessed/performed Overall Cognitive Status: No family/caregiver present to determine baseline cognitive functioning                                 General Comments: pt with hx of dementia at baseline; cognition appearing overall WFL for functional tasks completed today and Pt A&Ox4, no family present during session    General [place] expects to be discharged to:: Private residence Living Arrangements: Children(son) Available Help at Discharge: Family;Available 24 hours/day Type of Home: House Home Access: Stairs to enter [employer] of Steps: 2 steps onto porch Entrance Stairs-Rails: Right;Left;Can reach both Home Layout: One level     Bathroom Shower/Tub: Teacher, early years/pre: Standard     Home Equipment: Cane - single point;Bedside commode;Grab bars - tub/shower;Shower seat          Prior Functioning/Environment Level of Independence: Needs assistance  Gait / Transfers Assistance Needed: reports using SPC, mostly household distances  ADL's / Homemaking Assistance Needed: pt reports son assists with  tub transfers and daughter assists with bathing; reports she is able to dress without assist   Comments: no family present this session to confirm accuracy with PLOF         OT Problem List: Impaired balance (sitting and/or standing);Decreased strength;Decreased activity tolerance      OT Treatment/Interventions: Self-care/ADL training;DME and/or AE instruction;Balance training;Therapeutic activities;Patient/family education;Therapeutic exercise    OT Goals(Current goals can be found in the care plan section) Acute Rehab OT Goals Patient Stated Goal: return home  OT Goal Formulation: With patient Time For Goal Achievement: 11/06/17 Potential to Achieve Goals: Good  OT Frequency: Min 2X/week                             AM-PAC PT "6 Clicks" Daily Activity     Outcome Measure Help from another person eating meals?: None Help from another person taking care of personal grooming?: A Little Help from another person toileting, which includes using toliet, bedpan, or urinal?: A Little Help from another person bathing (including washing, rinsing, drying)?: A Little Help from another person to put on and taking off regular upper body clothing?: None Help from another person to put on and taking off regular lower body clothing?: A Little 6 Click Score: 20   End of Session Equipment Utilized During Treatment: Gait belt;Other (comment)(SPC )  Nurse Communication: Mobility status  Activity Tolerance: Patient tolerated treatment well Patient left: in bed;with call bell/phone within reach;with bed alarm set  OT Visit Diagnosis: Muscle weakness (generalized) (M62.81)                Time: 0930(-5 min while pt ordering lunch/dinner )-1010 OT Time Calculation (min): 40 min Charges:  OT General Charges $OT Visit: 1 Visit OT Evaluation $OT Eval Moderate Complexity: 1 Mod OT Treatments $Self Care/Home Management : 8-22 mins G-Codes:     Lou Cal, OT Pager  [phone] 10/23/2017  Raymondo Band 10/23/2017, 11:52 AM

## 2017-10-23 NOTE — Progress Notes (Signed)
Admitted via bed to 3W09,  A&O x 4, c/o bilateral hip pain left worse than right.  VSS, verbalized understanding of safety precautions, plan of care Meds.

## 2017-10-23 NOTE — Plan of Care (Signed)
We will continue to support Christine Zavala in her recovery from UTI and AKI.  Her mobility is increasing back to baseline, and she denies pain or distress.

## 2017-10-23 NOTE — Discharge Summary (Signed)
Name: Christine Zavala MRN: [id number] DOB: 02-Dec-1933 84 y.o. PCP: Christine Rossetti, MD  Date of Admission: 10/22/2017 11:19 AM Date of Discharge: 10/24/17 Attending Physician: Christine Kilts, MD  Discharge Diagnosis: Principal Problem:   Acute on chronic renal failure [facility]) Active Problems:   Hypertension   Type 2 diabetes mellitus with diabetic chronic kidney disease [facility])   Discharge Medications: Allergies as of 10/24/2017      Reactions   Aspirin Nausea And Vomiting   Calcium-containing Compounds    Legs burn   Codeine Sulfate Nausea And Vomiting   Lipitor [atorvastatin]    myalgias   Zocor [simvastatin]    pruritis   Zyrtec [cetirizine]       Medication List    STOP taking these medications   cephALEXin 500 MG capsule Commonly known as:  KEFLEX   furosemide 40 MG tablet Commonly known as:  LASIX   lisinopril 40 MG tablet Commonly known as:  PRINIVIL,ZESTRIL   predniSONE 10 MG tablet Commonly known as:  DELTASONE   predniSONE 20 MG tablet Commonly known as:  DELTASONE   trimethoprim 100 MG tablet Commonly known as:  TRIMPEX     TAKE these medications   acetaminophen 500 MG tablet Commonly known as:  TYLENOL Take 1,000 mg by mouth as needed for mild pain. Notes to patient:  Never take more than 4,000 mg in a 24 hour period.   clopidogrel 75 MG tablet Commonly known as:  PLAVIX TAKE 1 TABLET BY MOUTH EVERY DAY   COUGH DROPS 5.8 MG Lozg Generic drug:  Menthol Use as directed 1 lozenge in the mouth or throat as needed (cough and congestion).   DERMACLOUD Crea Apply to affected areas three times a day as needed   glimepiride 1 MG tablet Commonly known as:  AMARYL TAKE 1 TABLET BY MOUTH  DAILY WITH BREAKFAST   HYDROcodone-acetaminophen 5-325 MG tablet Commonly known as:  NORCO Take 1 tablet by mouth 2 (two) times daily as needed for up to 3 days for severe pain.   hydroxypropyl methylcellulose / hypromellose 2.5 % ophthalmic solution Commonly  known as:  ISOPTO TEARS / GONIOVISC Place 1 drop into both eyes as needed for dry eyes.   meloxicam 15 MG tablet Commonly known as:  MOBIC TAKE 1 TABLET BY MOUTH  DAILY   MYRBETRIQ 50 MG Tb24 tablet Generic drug:  mirabegron ER   pravastatin 80 MG tablet Commonly known as:  PRAVACHOL Take 1 tablet (80 mg total) by mouth daily.       Disposition and follow-up:   Ms.Christine Zavala was discharged from [facility] in Good condition.  At the hospital follow up visit please address:  1.  AKI - recheck creatinine, lasix and lisinopril were held at discharge - please address resuming these   2.  Labs / imaging needed at time of follow-up: BMP  3.  Pending labs/ test needing follow-up:   Follow-up Appointments:   Hospital Course by problem list: Principal Problem:   Acute on chronic renal failure (HCC) Active Problems:   Hypertension   Type 2 diabetes mellitus with diabetic chronic kidney disease ([place])   1. Acute Kidney Injury on CKD [name] presented to [facility] Emergency Department for abnormal lab work from her PCPs office. Upon arrival, the patient's vital signs were significant for mild hypotension, but was otherwise hemodynamically stable. Labs were significant for an elevated creatinine of 2.78 (baseline ~1.5). Urine analysis was negative for signs of infection, urine  culture had no growth, and CT renal study was also unremarkable. The patient was given IV fluids and her creatinine improved. Creatinine at time of discharge was 1.6.   2. Hyperkalemia Potassium level was 5.9 on admission. This resolved with IVF resuscitation.  3. ?UTI Patient was started on Keflex the day prior to admission for bacteruria/pyriuria without symptoms. She received a one time dose of Ceftriaxone in the ED. She to denied urinary symptoms for duration of hospitalization. Urine analysis was negative for signs of infection and urine culture had no growth. Antibiotics were not resumed.     4. Type 2 Diabetes Mellitus Blood glucose levels were monitored TID with meals and at night. Diabetes was managed with SSI-Sensitive coverage. Patient's blood glucose met inpatient goals.     Discharge Vitals:   BP (!) 124/59 (BP Location: Left Arm)   Pulse 76   Temp 97.9 F (36.6 C) (Oral)   Resp 18   Ht '5\' 2"'  (1.575 m)   Wt 280 lb 6.8 oz (127.2 kg)   SpO2 99%   BMI 51.29 kg/m   Pertinent Labs, Studies, and Procedures:  BMP Latest Ref Rng & Units 10/24/2017 10/23/2017 10/22/2017  Glucose 65 - 99 mg/dL 99 220(H) 357(H)  BUN 6 - 20 mg/dL 34(H) 53(H) 56(H)  Creatinine 0.44 - 1.00 mg/dL 1.66(H) 2.43(H) 2.78(H)  BUN/Creat Ratio 6 - 22 (calc) - - -  Sodium 135 - 145 mmol/L 139 140 139  Potassium 3.5 - 5.1 mmol/L 4.4 4.8 5.9(H)  Chloride 101 - 111 mmol/L 112(H) 109 108  CO2 22 - 32 mmol/L 22 21(L) 22  Calcium 8.9 - 10.3 mg/dL 8.0(L) 8.7(L) 8.9   CBC Latest Ref Rng & Units 10/24/2017 10/22/2017 10/21/2017  WBC 4.0 - 10.5 K/uL 8.2 10.7(H) 10.2  Hemoglobin 12.0 - 15.0 g/dL 10.1(L) 11.2(L) 11.3(L)  Hematocrit 36.0 - 46.0 % 32.3(L) 35.8(L) 34.1(L)  Platelets 150 - 400 K/uL 179 230 243   EXAM: CT ABDOMEN AND PELVIS WITHOUT CONTRAST FINDINGS: Adrenals/Urinary Tract: Adrenal glands are normal. The low-density lesion at the lower pole of the left kidney is stable. There is no nephrolithiasis. No obstructive uropathy is present. Ureters and urinary bladder are within normal limits.  Stomach/Bowel: The stomach and duodenum are within normal limits. A loop of small bowel herniates into the supraumbilical hernia the left. Bowel proximal to the hernia is of normal caliber. Bowel distal to the hernia is partially collapsed. Ventral Mohiuddin adhesion is noted. Bowel anastomosis is intact. Terminal ileum is mostly collapsed. Ascending transverse colon are within normal limits. Descending colon is unremarkable. Diverticular changes are present in the sigmoid colon without focal inflammation to  suggest diverticulitis.  IMPRESSION: 1. Supraumbilical hernia contains a loop of small bowel. Small bowel is of normal caliber proximal to the hernia but decreased caliber distal. This could represent some degree of obstruction. No dilated bowel is present. 2. Previous small bowel surgery.  Anastomosis is intact. 3. Sigmoid diverticulosis without diverticulitis. 4.  Aortic Atherosclerosis (ICD10-I70.0). 5. Stable multilevel degenerative changes of the lumbar spine.   Discharge Instructions: Discharge Instructions    Call MD for:  difficulty breathing, headache or visual disturbances   Complete by:  As directed    Call MD for:  persistant dizziness or light-headedness   Complete by:  As directed    Call MD for:  severe uncontrolled pain   Complete by:  As directed    Diet - low sodium heart healthy   Complete by:  As directed  Increase activity slowly   Complete by:  As directed       Signed: Ledell Noss, MD 10/24/2017, 2:38 PM   Pager: [phone]

## 2017-10-23 NOTE — Evaluation (Signed)
Physical Therapy Evaluation Patient Details Name: Christine Zavala MRN: [id number] DOB: 1933/12/21 Today's Date: 10/23/2017   History of Present Illness  84 yo F with Hx of T2DM, HTN, Overactive Bladder, Rectal Ca (s/p resection in 2003/2004), CVA, Dementia, and OA who presented with AKI and Fatigue  Clinical Impression  Patient presents likely close to functional baseline.  She doesn't walk much at home and uses cane in the home.  Likely remains fall risk unless she will use the walker in the yard.  She could benefit from skilled PT in the acute setting prior to d/c home with family support and follow up HHPT..    Follow Up Recommendations Supervision/Assistance - 24 hour;Home health PT    Equipment Recommendations  None recommended by PT    Recommendations for Other Services       Precautions / Restrictions Precautions Precautions: Fall Precaution Comments: pt reports approx 4 falls in the past year (2 inside the house, 2 in the yard)      Mobility  Bed Mobility   Bed Mobility: Supine to Sit;Sit to Supine     Supine to sit: HOB elevated;Supervision Sit to supine: Supervision   General bed mobility comments: patient using rails to come upright, to supine with HOB flat no assist  Transfers Overall transfer level: Needs assistance Equipment used: Straight cane Transfers: Sit to/from Stand Sit to Stand: Supervision         General transfer comment: able to stand from bed unaided; from shower seat with min A for safety  Ambulation/Gait Ambulation/Gait assistance: Supervision;Min guard Ambulation Distance (Feet): 20 Feet Assistive device: Straight cane Gait Pattern/deviations: Step-through pattern;Decreased stride length;Trendelenburg;Narrow base of support;Scissoring     General Gait Details: patient with increased lateral sway and trendelenberg due to hip weakness and pain, but no LOB  Stairs            Wheelchair Mobility    Modified Rankin (Stroke Patients  Only)       Balance Overall balance assessment: Needs assistance   Sitting balance-Leahy Scale: Good       Standing balance-Leahy Scale: Fair Standing balance comment: standing to pivot to BSC unaided                             Pertinent Vitals/Pain Pain Assessment: Faces Faces Pain Scale: Hurts a little bit Pain Location: L hip with mobility Pain Descriptors / Indicators: Aching Pain Intervention(s): Monitored during session;Limited activity within patient's tolerance;Repositioned    Home Living Family/patient expects to be discharged to:: Private residence Living Arrangements: Children(son) Available Help at Discharge: Family;Available 24 hours/day Type of Home: House Home Access: Stairs to enter Entrance Stairs-Rails: Right;Left;Can reach both Entrance Stairs-Number of Steps: 2 steps onto porch Home Layout: One level Home Equipment: Cane - single point;Bedside commode;Grab bars - tub/shower;Shower seat      Prior Function Level of Independence: Needs assistance   Gait / Transfers Assistance Needed: reports using SPC, mostly household distances   ADL's / Homemaking Assistance Needed: pt reports son assists with tub transfers and daughter assists with bathing; reports she is able to dress without assist        Hand Dominance   Dominant Hand: Right    Extremity/Trunk Assessment   Upper Extremity Assessment Upper Extremity Assessment: Defer to OT evaluation    Lower Extremity Assessment Lower Extremity Assessment: Overall WFL for tasks assessed    Cervical / Trunk Assessment Cervical / Trunk Assessment: Kyphotic  Communication   Communication: No difficulties  Cognition Arousal/Alertness: Awake/alert Behavior During Therapy: WFL for tasks assessed/performed Overall Cognitive Status: No family/caregiver present to determine baseline cognitive functioning                                 General Comments: pt with hx of dementia  at baseline; cognition appearing overall WFL for functional tasks completed today and Pt A&Ox4, no family present during session       General Comments General comments (skin integrity, edema, etc.): desired to shower so RN in to saline lock and cover IV: assist to [facility] with some incontinence, then to shower in bathroom with cane,  Patient showered with dist supervision and assist for toweling off back of her.      Exercises     Assessment/Plan    PT Assessment Patient needs continued PT services  PT Problem List Decreased strength;Decreased mobility;Decreased safety awareness;Decreased activity tolerance;Decreased balance;Decreased knowledge of use of DME       PT Treatment Interventions DME instruction;Functional mobility training;Balance training;Patient/family education;Gait training;Therapeutic activities;Stair training;Therapeutic exercise    PT Goals (Current goals can be found in the Care Plan section)  Acute Rehab PT Goals Patient Stated Goal: return home  PT Goal Formulation: With patient Time For Goal Achievement: 11/06/17 Potential to Achieve Goals: Good    Frequency Min 3X/week   Barriers to discharge        Co-evaluation               AM-PAC PT "6 Clicks" Daily Activity  Outcome Measure Difficulty turning over in bed (including adjusting bedclothes, sheets and blankets)?: A Little Difficulty moving from lying on back to sitting on the side of the bed? : A Lot Difficulty sitting down on and standing up from a chair with arms (e.g., wheelchair, bedside commode, etc,.)?: A Lot Help needed moving to and from a bed to chair (including a wheelchair)?: A Little Help needed walking in hospital room?: A Little Help needed climbing 3-5 steps with a railing? : A Lot 6 Click Score: 15    End of Session   Activity Tolerance: Patient tolerated treatment well Patient left: in bed;with call bell/phone within reach;with bed alarm set   PT Visit Diagnosis:  Unsteadiness on feet (R26.81);Other abnormalities of gait and mobility (R26.89)    Time: 4163-8453 PT Time Calculation (min) (ACUTE ONLY): 43 min   Charges:   PT Evaluation $PT Eval Moderate Complexity: 1 Mod PT Treatments $Gait Training: 8-22 mins $Therapeutic Activity: 8-22 mins   PT G CodesMagda Zavala, [place] [phone] 10/23/2017   Christine Zavala 10/23/2017, 4:25 PM

## 2017-10-24 LAB — GLUCOSE, CAPILLARY
Glucose-Capillary: 113 mg/dL — ABNORMAL HIGH (ref 65–99)
Glucose-Capillary: 127 mg/dL — ABNORMAL HIGH (ref 65–99)

## 2017-10-24 LAB — CBC
HCT: 32.3 % — ABNORMAL LOW (ref 36.0–46.0)
Hemoglobin: 10.1 g/dL — ABNORMAL LOW (ref 12.0–15.0)
MCH: 30.6 pg (ref 26.0–34.0)
MCHC: 31.3 g/dL (ref 30.0–36.0)
MCV: 97.9 fL (ref 78.0–100.0)
Platelets: 179 10*3/uL (ref 150–400)
RBC: 3.3 MIL/uL — ABNORMAL LOW (ref 3.87–5.11)
RDW: 14.1 % (ref 11.5–15.5)
WBC: 8.2 10*3/uL (ref 4.0–10.5)

## 2017-10-24 LAB — BASIC METABOLIC PANEL
Anion gap: 5 (ref 5–15)
BUN: 34 mg/dL — ABNORMAL HIGH (ref 6–20)
CO2: 22 mmol/L (ref 22–32)
Calcium: 8 mg/dL — ABNORMAL LOW (ref 8.9–10.3)
Chloride: 112 mmol/L — ABNORMAL HIGH (ref 101–111)
Creatinine, Ser: 1.66 mg/dL — ABNORMAL HIGH (ref 0.44–1.00)
GFR calc Af Amer: 32 mL/min — ABNORMAL LOW (ref >60)
GFR calc non Af Amer: 28 mL/min — ABNORMAL LOW (ref >60)
Glucose, Bld: 99 mg/dL (ref 65–99)
Potassium: 4.4 mmol/L (ref 3.5–5.1)
Sodium: 139 mmol/L (ref 135–145)

## 2017-10-24 NOTE — Progress Notes (Signed)
  Date: 10/24/2017  Patient name: Christine Zavala  Medical record number: [id number]  Date of birth: 02-Aug-1933   This patient's plan of care was discussed with the house staff. Please see Dr. Fredrik Cove note for complete details. I concur with her findings.   Sid Falcon, MD 10/24/2017, 11:55 AM

## 2017-10-24 NOTE — Plan of Care (Signed)
This patient is expecting discharge today.  She shows no signs or symptoms of distress, denies pain and has a pleasant demeanor.  She refuses physical therapy at her home, although she is still weakened from her illness.

## 2017-10-24 NOTE — Progress Notes (Signed)
Christine Zavala is to be discharged today.  She denies pain, difficulty urinating, and feels safe to go home.  She is weak, but will be with her daughter tonight.  I spent considerable time educating the patient and her family on diabetes, Ha1c, and treatment.  She will be following up with her primary care provider in July.  It was a pleasure taking care of Christine Zavala.

## 2017-10-24 NOTE — Progress Notes (Signed)
Subjective: Ms. Ging is doing well this morning. She reports no problems overnight, and states that she feels well and is ready to go home. Asked patient about usage of Lasix, and she doesn't remember taking it, but she also can't remember the names of any of her medications.  Objective: Vital signs in last 24 hours: Vitals:   10/23/17 1546 10/23/17 2038 10/24/17 0419 10/24/17 0809  BP: (!) 124/53 (!) 114/56 (!) 116/50 (!) 127/57  Pulse: 63 63 72 74  Resp: 18 20 19 18   Temp: 98.2 F (36.8 C) (!) 97.5 F (36.4 C) 98.2 F (36.8 C) 98.3 F (36.8 C)  TempSrc: Oral Oral Oral Oral  SpO2: 100% 99% 98% 98%  Weight:      Height:       Weight change:   Intake/Output Summary (Last 24 hours) at 10/24/2017 0824 Last data filed at 10/24/2017 0700 Gross per 24 hour  Intake 800 ml  Output 150 ml  Net 650 ml   General appearance: Elderly woman lying comfortably in bed Eyes: conjunctivae/corneas clear. PERRL, EOM's intact. Fundi benign. Throat: lips, mucosa, and tongue normal; teeth and gums normal Lungs: clear to auscultation bilaterally Heart: regular rate and rhythm, S1, S2 normal, no murmur, click, rub or gallop Abdomen: soft, non-tender; bowel sounds normal; no masses,  no organomegaly Extremities: edema in bilateral LEs Pulses: 2+ and symmetric Neurologic: Grossly normal Lab Results: @LABTEST2 @ Micro Results: Recent Results (from the past 240 hour(s))  Urine Culture     Status: None   Collection Time: 10/21/17  4:34 PM  Result Value Ref Range Status   MICRO NUMBER: [id number]  Final   SPECIMEN QUALITY: ADEQUATE  Final   Sample Source NOT GIVEN  Final   STATUS: FINAL  Final   Result:   Final    Multiple organisms present, each less than 10,000 CFU/mL. These organisms, commonly found on external and internal genitalia, are considered to be colonizers. No further testing performed.  Urine culture     Status: None   Collection Time: 10/22/17  3:06 PM  Result Value Ref Range Status   Specimen Description URINE, RANDOM  Final   Special Requests NONE  Final   Culture   Final    NO GROWTH Performed at [facility], [address] [place], [place] [address]    Report Status 10/23/2017 FINAL  Final   Studies/Results: Ct Renal Stone Study  Result Date: 10/22/2017 CLINICAL DATA:  Urinary tract infection. Progressive decrease in kidney function. Generalized weakness. Flank pain. Stone disease suspected. EXAM: CT ABDOMEN AND PELVIS WITHOUT CONTRAST TECHNIQUE: Multidetector CT imaging of the abdomen and pelvis was performed following the standard protocol without IV contrast. COMPARISON:  CT abdomen and pelvis 07/08/2017 at [facility] specialist FINDINGS: Lower chest: The lung bases are clear without airspace disease. No significant pleural or pericardial effusion is present. Heart size is normal. Hepatobiliary: No focal liver abnormality is seen. Status post cholecystectomy. No biliary dilatation. Pancreas: Unremarkable. No pancreatic ductal dilatation or surrounding inflammatory changes. Spleen: Normal in size without focal abnormality. Adrenals/Urinary Tract: Adrenal glands are normal. The low-density lesion at the lower pole of the left kidney is stable. There is no nephrolithiasis. No obstructive uropathy is present. Ureters and urinary bladder are within normal limits. Stomach/Bowel: The stomach and duodenum are within normal limits. A loop of small bowel herniates into the supraumbilical hernia the left. Bowel proximal to the hernia is of normal caliber. Bowel distal to the hernia is partially collapsed.  Ventral Ernsberger adhesion is noted. Bowel anastomosis is intact. Terminal ileum is mostly collapsed. Ascending transverse colon are within normal limits. Descending colon is unremarkable. Diverticular changes are present in the sigmoid colon without focal inflammation to suggest diverticulitis. Vascular/Lymphatic: Atherosclerotic calcifications are present. There is no aneurysm.  Reproductive: Uterus and adnexa are within normal limits. Other: No significant free fluid is present. Ventral hernia is as described above. Musculoskeletal: Vacuum disc disease is again noted in the lumbar spine. Mild endplate changes are present. Vertebral body heights and alignment are maintained. No focal lytic blastic lesions present. Bony pelvis is intact. Hips are located and within normal limits. IMPRESSION: 1. Supraumbilical hernia contains a loop of small bowel. Small bowel is of normal caliber proximal to the hernia but decreased caliber distal. This could represent some degree of obstruction. No dilated bowel is present. 2. Previous small bowel surgery.  Anastomosis is intact. 3. Sigmoid diverticulosis without diverticulitis. 4.  Aortic Atherosclerosis (ICD10-I70.0). 5. Stable multilevel degenerative changes of the lumbar spine. Electronically Signed   By: San Morelle M.D.   On: 10/22/2017 16:35   Medications: I have reviewed the patient's current medications. Scheduled Meds: . clopidogrel  75 mg Oral Daily  . heparin  5,000 Units Subcutaneous Q8H  . insulin aspart  0-9 Units Subcutaneous TID WC  . mirabegron ER  50 mg Oral Daily  . pravastatin  80 mg Oral Daily   Continuous Infusions: . sodium chloride 100 mL/hr (10/24/17 0711)   PRN Meds:.acetaminophen **OR** acetaminophen, hydroxypropyl methylcellulose / hypromellose, ondansetron (ZOFRAN) IV **OR** ondansetron Assessment/Plan: Principal Problem:   Acute on chronic renal failure (HCC) Active Problems:   Hypertension   Type 2 diabetes mellitus with diabetic chronic kidney disease ([place])  AKI on CKD II Patient had a PCP visit on 5/30 and was found to have a Cr of 2.62 up from her baseline of 1.3-1.6, and was instructed to report to the ED. AKI appears to be pre renal w/ BUN:Cr ratio >15. Obtained a CT renal study that showed no nephrolithiasis, obstructive uropathy. Her ureters and bladder appeared normal. UA was normal.  Creatine has continued to downtrend since high of 2.78, and is 1.66 this morning. This supports a pre renal etiology for her AKI, although the cause is still unclear at this point. She has a prescription for Lasix PRN, but seems unaware of this. She reports that she manages her medications, so it seems possible she has been taking the Lasix every day, which could explain some of her urinary incontinence.  -D/C today -hold Lasix and Lisinopril until follow-up with PCP next week  Hyperkalemia: resolved Potassium was 5.9 on admission. 4.8 on repeat lab.    UTI: History of chronic overactive bladder, for which she recently started Myrbetriq, and recurrent UTIs. Her UA at the PCP was concerning for bacteriuria/pyuria, and she was started on Keflex. She took one dose before presenting to the ED. She denies any urinary symptoms. Urine culture from PCP visit was negative. Received ceftriaxone in the ED on arrival. Repeat UA was unremarkable. Urine culture grew no organisms.  Chronic Fatigue: Patient and daughter report 1 year of chronic fatigue, worse in the past week.  -PT/OT Eval and treat  T2DM:  A1C on 07/29/17: 8.1. BG 113 6/1 am. -CBG monitoring TID with meals and night -SSI-sensitive coverage  HTN:  Most recent BP 127/57 . On Lasix 40mg  daily and lisinopril 20mg  daily at home -hold Lasix and lisinopril as above  FEN:HH/CMdiet VTE KKX:FGHWEXH Code Status:  FULL  Dispo: Discharge to home today  This is a Careers information officer Note.  The care of the patient was discussed with Dr. Peyton Najjar and the assessment and plan formulated with their assistance.  Please see their attached note for official documentation of the daily encounter.   LOS: 2 days   Verdis Frederickson, Medical Student 10/24/2017, 8:24 AM

## 2017-10-24 NOTE — Progress Notes (Addendum)
   Subjective: Christine Zavala is feeling like her usual self today. She does not report any new symptoms. She is not able to recall her medication list or the name of her primary physician but says that she organizes the medications based on the instructions on the bottle and takes what is needed daily. She feels that she is ready to go home today. She lives with her son and will have 24 hour supervision at home. She has two children who live close to her. She reports good po intake, says she likes to drink a lot of water. There is PRN lasix listed on her medication list but she does not really recall ever taking this medication. She will arrange for follow up with her PCP when their office reopens early next week.   Objective:  Vital signs in last 24 hours: Vitals:   10/23/17 1135 10/23/17 1546 10/23/17 2038 10/24/17 0419  BP: (!) 120/57 (!) 124/53 (!) 114/56 (!) 116/50  Pulse: 69 63 63 72  Resp: 18 18 20 19   Temp: 98.1 F (36.7 C) 98.2 F (36.8 C) (!) 97.5 F (36.4 C) 98.2 F (36.8 C)  TempSrc: Oral Oral Oral Oral  SpO2: 100% 100% 99% 98%  Weight:      Height:       General: well appearing, no acute distress, sitting up in bed eating breakfast Cardiac: regular rate and rhythm, no murmur appreciated, no peripheral edema  Pulm: normal work of breathing, lungs clear to auscultation throughout   Assessment/Plan:  Principal Problem:   Acute on chronic renal failure (HCC) Active Problems:   Hypertension   Type 2 diabetes mellitus with diabetic chronic kidney disease ([place])  AKI on CKD II Patient noted to have Cr 2.62 at PCP office and instructed to come to ED. Baseline Cr in 1.3-1.6. CT renal study was unremarkable: no nephrolithiasis, no obstructive uropathy, Ureters and urinary bladder are within normal limits. UA unremarkable. Unclear etiology, but likely pre-renal at creatinine has improved back to baseline of 1.6 with IV and PO fluid.  - crt improved to baseline 1.6, crt was 2.4  yesterday  -Continue NS @ 100cc/hr, will encourage continued PO intake after discharge  - hold lisinopril and lasix at discharge with plans for reevaluation of these at PCP follow up in the next few days  Hyperkalemia - resolved   ?UTI Patient has a history of chronic overactive bladder (on Myrbetriq samples, that have been working for her) and recurrent UTIs. She was started on Keflex the day prior to admission for bacteruria/pyriuria without symptoms. She continue to deny urinary symptoms. Urine cx from recent outpatient visit was negative. UA was unremarkable. Received ceftriaxone in ED on arrival.   - Will hold further antibiotics - repeat urine culture 5/30 with no growth   Chronic Fatigue Patient and daughter report 1 year of chronic fatigue, worse in the past 1 week.  - PT eval: HH PT, 24 hours of supervision  OT Eval : HH OT, 24 hours of supervision   Diabetes Last A1c: 8.1 (07/29/17). BG 99 this AM.  - resume home meds at discharge   HTN Normotensive, most recent BP 127/57. On Lasix 40mg  daily and lisinopril 20mg  Daily at home. - Hold home Lisinopril and Lasix in the setting of AKI and soft BP at discharge, this should be followed up at hospital follow up   Dispo: Anticipated discharge in approximately today(s).   Ledell Noss, MD 10/24/2017, 8:08 AM Pager: [phone]

## 2017-10-24 NOTE — Discharge Instructions (Signed)
Christine Zavala, you were hospitalized for symptoms of weakness and lower back pain. The test that we ran on your urine did not show that you have a urinary tract infection but they did reveal some stress on your kidneys. Your kidneys returned back to their normal function with some IV fluids. We think that your kidneys being stressed out may have been related to some of the medications you are taking so we adjusted your medication list to reflect what you should take until you follow up with your primary care doctor.   Stop taking lisinopril and lasix until you have followed up with your primary care doctor. Your blood pressure was fine while you were in the hospital without these medications so it should be okay for you to hold off until your office visit.   You will be working with a physical therapist at home   Bring your medication bottles with you to your follow up appointment with your primary care provider and ask them what you should be taking   Please call our clinic if you have any questions or concerns, we may be able to help and keep you from a long and expensive emergency room wait. Our clinic and after hours phone number is [phone], there is always someone available. If you need medication refills please notify your pharmacy one week in advance and they will send [place] a request.

## 2017-10-27 ENCOUNTER — Telehealth: Payer: Self-pay | Admitting: *Deleted

## 2017-10-27 NOTE — Telephone Encounter (Signed)
Patient discharged from [facility] on 10/22/2017 with dx AKI. Advised to F/U with PCP. Discharge instructions: AKI - recheck creatinine, lasix and lisinopril were held at discharge - please address resuming these/ BMP  Call placed to patient daughter, [name]. [place]

## 2017-10-27 NOTE — Consult Note (Signed)
            [facility] CM Primary Care Navigator  10/27/2017  Karmyn Rittenberry 1933/07/10 [id number]   Attempt to seepatient at the bedsideto identify possible discharge needs but she was alreadydischargedover the weekend.  Patient was discharged homewith home health services.  PerMD note,patient was admitted for acute kidney injury on chronic kidney disease, UTI, hyperkalemia.  Primary care provider's office called(Dr. Brantley Persons assistant)to notify of patient's discharge andneed for post hospital follow-up and transition of care (TOC). Notified of health issues needing follow-up. Made aware to refer patient to [facility] CM if deemed necessary and appropriate for services.   For additional questions please contact:  Edwena Felty A. Dena Esperanza, BSN, RN-BC [facility] PRIMARY CARE Navigator Cell: [phone]

## 2017-11-02 NOTE — Telephone Encounter (Signed)
Patient has hospital F/U scheduled.

## 2017-11-10 ENCOUNTER — Other Ambulatory Visit: Payer: Self-pay

## 2017-11-10 ENCOUNTER — Encounter: Payer: Self-pay | Admitting: Family Medicine

## 2017-11-10 ENCOUNTER — Ambulatory Visit (INDEPENDENT_AMBULATORY_CARE_PROVIDER_SITE_OTHER): Payer: Medicare Other | Admitting: Family Medicine

## 2017-11-10 VITALS — BP 130/74 | HR 90 | Temp 97.9°F | Resp 16 | Ht 62.0 in | Wt 271.0 lb

## 2017-11-10 DIAGNOSIS — I10 Essential (primary) hypertension: Secondary | ICD-10-CM

## 2017-11-10 DIAGNOSIS — R609 Edema, unspecified: Secondary | ICD-10-CM

## 2017-11-10 DIAGNOSIS — N390 Urinary tract infection, site not specified: Secondary | ICD-10-CM

## 2017-11-10 DIAGNOSIS — N183 Chronic kidney disease, stage 3 unspecified: Secondary | ICD-10-CM

## 2017-11-10 DIAGNOSIS — E1122 Type 2 diabetes mellitus with diabetic chronic kidney disease: Secondary | ICD-10-CM | POA: Diagnosis not present

## 2017-11-10 DIAGNOSIS — N179 Acute kidney failure, unspecified: Secondary | ICD-10-CM | POA: Diagnosis not present

## 2017-11-10 LAB — MICROSCOPIC MESSAGE

## 2017-11-10 LAB — URINALYSIS, ROUTINE W REFLEX MICROSCOPIC
Bilirubin Urine: NEGATIVE
Glucose, UA: NEGATIVE
HGB URINE DIPSTICK: NEGATIVE
Ketones, ur: NEGATIVE
Nitrite: NEGATIVE
Protein, ur: NEGATIVE
RBC / HPF: NONE SEEN /HPF (ref 0–2)
Specific Gravity, Urine: 1.02 (ref 1.001–1.03)
pH: 6 (ref 5.0–8.0)

## 2017-11-10 MED ORDER — FUROSEMIDE 40 MG PO TABS: 40.0000 mg | ORAL_TABLET | Freq: Every day | ORAL | 3 refills | Status: DC | PRN

## 2017-11-10 NOTE — Assessment & Plan Note (Signed)
Will use the Lasix as needed make sure that she does keep eating well and stay hydrated before going back to daily.  She has compression hose at home but has not been consistent with wearing these.  Discussed wearing this in loop of taking a water pill every day and seeing how she does.

## 2017-11-10 NOTE — Assessment & Plan Note (Signed)
Chronic renal failure her baseline is around 1.5-1.6.  We will recheck her level as well as a potassium today.  Just the importance of staying hydrated.

## 2017-11-10 NOTE — Progress Notes (Signed)
Subjective:    Patient ID: Christine Zavala, female    DOB: July 08, 1933, 84 y.o.   MRN: [id number]  Patient presents for Hospital F/U  Pt here for hospital follow-up.  She was admitted to the hospital after she had acute on chronic renal failure in the setting of dehydration.  Initially thought that she had urinary tract infection but culture came back negative.  She was given antibiotic the first 24 hours and this was discontinued.  After given bolus of fluid her creatinine went from 2.78 down to 1.66 at discharge.  She also had elevated potassium level which was 5.9 this went down with IV fluid resuscitation as well. Her weight is actually gone down 9 pounds since she came out of the hospital as well. She states that she feels okay now.  She is eating regularly and drinking more fluids.  Her daughters help him with this.  Her lisinopril and Lasix were held in the setting of her acute renal failure.  Like to have her urine rechecked today.  She does not have any current symptoms.  She is on Myrbetriq as well as trimethoprim from urology.  DM- did not bring meter, last A1C 8.1% in March, taking amaryl   Review Of Systems:  GEN- denies fatigue, fever, weight loss,weakness, recent illness HEENT- denies eye drainage, change in vision, nasal discharge, CVS- denies chest pain, palpitations RESP- denies SOB, cough, wheeze ABD- denies N/V, change in stools, abd pain GU- denies dysuria, hematuria, dribbling, incontinence MSK- denies joint pain, muscle aches, injury Neuro- denies headache, dizziness, syncope, seizure activity       Objective:    BP 130/74   Pulse 90   Temp 97.9 F (36.6 C) (Oral)   Resp 16   Ht 5\' 2"  (1.575 m)   Wt 271 lb (122.9 kg)   SpO2 97%   BMI 49.57 kg/m  GEN- NAD, alert and oriented x3,walking with cane  HEENT- PERRL, EOMI, non injected sclera, pink conjunctiva, MMM, oropharynx clear Neck- Supple, no thyromegaly CVS- RRR, no  murmur RESP-CTAB ABD-NABS,soft,NT,ND EXT- pedal edema Pulses- Radial 2+        Assessment & Plan:      Problem List Items Addressed This Visit      Unprioritized   Acute on chronic renal failure (HCC)    Chronic renal failure her baseline is around 1.5-1.6.  We will recheck her level as well as a potassium today.  Just the importance of staying hydrated.      Relevant Orders   Basic metabolic panel   Hypertension    Continue to hold the lisinopril for now her blood pressure looks good.      Relevant Medications   furosemide (LASIX) 40 MG tablet   Peripheral edema    Will use the Lasix as needed make sure that she does keep eating well and stay hydrated before going back to daily.  She has compression hose at home but has not been consistent with wearing these.  Discussed wearing this in loop of taking a water pill every day and seeing how she does.      Type 2 diabetes mellitus with diabetic chronic kidney disease ([place])   Relevant Orders   Basic metabolic panel   CBC with Differential/Platelet   Hemoglobin A1c    Other Visit Diagnoses    Frequent UTI    -  Primary   Recheck urine culture.  Continue current medications.  Her weight loss may be multifactorial we will just  monitor for now.  She is not fluid overloaded   Relevant Medications   trimethoprim (TRIMPEX) 100 MG tablet   Other Relevant Orders   Urinalysis, Routine w reflex microscopic   Urine Culture      Note: This dictation was prepared with Dragon dictation along with smaller phrase technology. Any transcriptional errors that result from this process are unintentional.

## 2017-11-10 NOTE — Patient Instructions (Signed)
Use lasix as needed We will call with results  Stay off the lisinopril  Wear the compression hose F/U as previous

## 2017-11-10 NOTE — Assessment & Plan Note (Signed)
Continue to hold the lisinopril for now her blood pressure looks good.

## 2017-11-11 LAB — CBC WITH DIFFERENTIAL/PLATELET
BASOS PCT: 0.3 %
Basophils Absolute: 36 cells/uL (ref 0–200)
EOS ABS: 96 {cells}/uL (ref 15–500)
Eosinophils Relative: 0.8 %
HEMATOCRIT: 34.9 % — AB (ref 35.0–45.0)
Hemoglobin: 11.6 g/dL — ABNORMAL LOW (ref 11.7–15.5)
LYMPHS ABS: 1248 {cells}/uL (ref 850–3900)
MCH: 30.7 pg (ref 27.0–33.0)
MCHC: 33.2 g/dL (ref 32.0–36.0)
MCV: 92.3 fL (ref 80.0–100.0)
MPV: 11.3 fL (ref 7.5–12.5)
Monocytes Relative: 6.8 %
NEUTROS PCT: 81.7 %
Neutro Abs: 9804 cells/uL — ABNORMAL HIGH (ref 1500–7800)
Platelets: 224 10*3/uL (ref 140–400)
RBC: 3.78 10*6/uL — AB (ref 3.80–5.10)
RDW: 13.5 % (ref 11.0–15.0)
Total Lymphocyte: 10.4 %
WBC: 12 10*3/uL — AB (ref 3.8–10.8)
WBCMIX: 816 {cells}/uL (ref 200–950)

## 2017-11-11 LAB — BASIC METABOLIC PANEL
BUN / CREAT RATIO: 12 (calc) (ref 6–22)
BUN: 16 mg/dL (ref 7–25)
CO2: 24 mmol/L (ref 20–32)
CREATININE: 1.37 mg/dL — AB (ref 0.60–0.88)
Calcium: 9 mg/dL (ref 8.6–10.4)
Chloride: 106 mmol/L (ref 98–110)
Glucose, Bld: 178 mg/dL — ABNORMAL HIGH (ref 65–99)
POTASSIUM: 4.6 mmol/L (ref 3.5–5.3)
Sodium: 140 mmol/L (ref 135–146)

## 2017-11-11 LAB — HEMOGLOBIN A1C
EAG (MMOL/L): 9.7 (calc)
Hgb A1c MFr Bld: 7.7 % of total Hgb — ABNORMAL HIGH (ref <5.7)
MEAN PLASMA GLUCOSE: 174 (calc)

## 2017-11-13 ENCOUNTER — Other Ambulatory Visit: Payer: Self-pay | Admitting: *Deleted

## 2017-11-13 LAB — URINE CULTURE
MICRO NUMBER: 90728199
SPECIMEN QUALITY:: ADEQUATE

## 2017-11-13 MED ORDER — LEVOFLOXACIN 250 MG PO TABS: ORAL_TABLET | ORAL | 0 refills | Status: DC

## 2017-12-02 ENCOUNTER — Other Ambulatory Visit: Payer: Self-pay | Admitting: *Deleted

## 2017-12-02 MED ORDER — ONETOUCH ULTRASOFT LANCETS MISC: 3 refills | Status: DC

## 2017-12-07 ENCOUNTER — Other Ambulatory Visit: Payer: Self-pay

## 2017-12-07 ENCOUNTER — Encounter: Payer: Self-pay | Admitting: Family Medicine

## 2017-12-07 ENCOUNTER — Ambulatory Visit (INDEPENDENT_AMBULATORY_CARE_PROVIDER_SITE_OTHER): Payer: Medicare Other | Admitting: Family Medicine

## 2017-12-07 VITALS — BP 128/74 | HR 78 | Temp 98.1°F | Resp 18 | Ht 62.0 in | Wt 274.0 lb

## 2017-12-07 DIAGNOSIS — R609 Edema, unspecified: Secondary | ICD-10-CM | POA: Diagnosis not present

## 2017-12-07 DIAGNOSIS — E1122 Type 2 diabetes mellitus with diabetic chronic kidney disease: Secondary | ICD-10-CM

## 2017-12-07 DIAGNOSIS — R269 Unspecified abnormalities of gait and mobility: Secondary | ICD-10-CM

## 2017-12-07 DIAGNOSIS — E782 Mixed hyperlipidemia: Secondary | ICD-10-CM | POA: Diagnosis not present

## 2017-12-07 DIAGNOSIS — Z85048 Personal history of other malignant neoplasm of rectum, rectosigmoid junction, and anus: Secondary | ICD-10-CM | POA: Diagnosis not present

## 2017-12-07 DIAGNOSIS — Z8673 Personal history of transient ischemic attack (TIA), and cerebral infarction without residual deficits: Secondary | ICD-10-CM | POA: Diagnosis not present

## 2017-12-07 DIAGNOSIS — N183 Chronic kidney disease, stage 3 unspecified: Secondary | ICD-10-CM

## 2017-12-07 DIAGNOSIS — R600 Localized edema: Secondary | ICD-10-CM

## 2017-12-07 DIAGNOSIS — Z0000 Encounter for general adult medical examination without abnormal findings: Secondary | ICD-10-CM

## 2017-12-07 LAB — COMPREHENSIVE METABOLIC PANEL
AG RATIO: 1.6 (calc) (ref 1.0–2.5)
ALKALINE PHOSPHATASE (APISO): 95 U/L (ref 33–130)
ALT: 18 U/L (ref 6–29)
AST: 20 U/L (ref 10–35)
Albumin: 3.8 g/dL (ref 3.6–5.1)
BILIRUBIN TOTAL: 0.8 mg/dL (ref 0.2–1.2)
BUN/Creatinine Ratio: 10 (calc) (ref 6–22)
BUN: 12 mg/dL (ref 7–25)
CALCIUM: 9 mg/dL (ref 8.6–10.4)
CO2: 24 mmol/L (ref 20–32)
Chloride: 106 mmol/L (ref 98–110)
Creat: 1.25 mg/dL — ABNORMAL HIGH (ref 0.60–0.88)
Globulin: 2.4 g/dL (calc) (ref 1.9–3.7)
Glucose, Bld: 170 mg/dL — ABNORMAL HIGH (ref 65–99)
Potassium: 4.6 mmol/L (ref 3.5–5.3)
Sodium: 140 mmol/L (ref 135–146)
Total Protein: 6.2 g/dL (ref 6.1–8.1)

## 2017-12-07 LAB — LIPID PANEL
Cholesterol: 206 mg/dL — ABNORMAL HIGH (ref <200)
HDL: 40 mg/dL — AB (ref >50)
LDL Cholesterol (Calc): 132 mg/dL (calc) — ABNORMAL HIGH
Non-HDL Cholesterol (Calc): 166 mg/dL (calc) — ABNORMAL HIGH (ref <130)
TRIGLYCERIDES: 206 mg/dL — AB (ref <150)
Total CHOL/HDL Ratio: 5.2 (calc) — ABNORMAL HIGH (ref <5.0)

## 2017-12-07 LAB — CBC WITH DIFFERENTIAL/PLATELET
BASOS ABS: 39 {cells}/uL (ref 0–200)
Basophils Relative: 0.5 %
EOS PCT: 1.7 %
Eosinophils Absolute: 131 cells/uL (ref 15–500)
HEMATOCRIT: 35.6 % (ref 35.0–45.0)
HEMOGLOBIN: 11.5 g/dL — AB (ref 11.7–15.5)
LYMPHS ABS: 1086 {cells}/uL (ref 850–3900)
MCH: 30.3 pg (ref 27.0–33.0)
MCHC: 32.3 g/dL (ref 32.0–36.0)
MCV: 93.9 fL (ref 80.0–100.0)
MONOS PCT: 6.7 %
MPV: 11.2 fL (ref 7.5–12.5)
NEUTROS ABS: 5929 {cells}/uL (ref 1500–7800)
NEUTROS PCT: 77 %
Platelets: 231 10*3/uL (ref 140–400)
RBC: 3.79 10*6/uL — AB (ref 3.80–5.10)
RDW: 13.6 % (ref 11.0–15.0)
Total Lymphocyte: 14.1 %
WBC mixed population: 516 cells/uL (ref 200–950)
WBC: 7.7 10*3/uL (ref 3.8–10.8)

## 2017-12-07 MED ORDER — BLOOD GLUCOSE SYSTEM PAK KIT: PACK | 1 refills | Status: DC

## 2017-12-07 MED ORDER — ZOSTER VAC RECOMB ADJUVANTED 50 MCG/0.5ML IM SUSR: 0.5000 mL | Freq: Once | INTRAMUSCULAR | 0 refills | Status: AC

## 2017-12-07 MED ORDER — FUROSEMIDE 40 MG PO TABS: 40.0000 mg | ORAL_TABLET | Freq: Every day | ORAL | 3 refills | Status: DC | PRN

## 2017-12-07 MED ORDER — BLOOD GLUCOSE TEST VI STRP: ORAL_STRIP | 1 refills | Status: DC

## 2017-12-07 MED ORDER — LANCETS MISC: 1 refills | Status: DC

## 2017-12-07 NOTE — Progress Notes (Signed)
Subjective:   Patient presents for Medicare Annual/Subsequent preventive examination.    Here with son   Request walker for home, rollator too big to fit through doors   Getting more edema on legs, has not been taking lasix, compression hose are too tight to get on, request a different kind  Had 1 day , had a blood tinged spot in depends, no other vaginal bleeding, no UTI symptoms, no blood in stools, no abd pain   Medications reviewed as well as last visit from hospital follow up  Review Past Medical/Family/Social: Per EMR   Risk Factors  Current exercise habits: none Dietary issues discussed: Yes  Cardiac risk factors: Obesity (BMI >= 30 kg/m2). DM, HTN, history of CVA  Depression Screen  (Note: if answer to either of the following is "Yes", a more complete depression screening is indicated)  Over the past two weeks, have you felt down, depressed or hopeless? No Over the past two weeks, have you felt little interest or pleasure in doing things? No Have you lost interest or pleasure in daily life? No Do you often feel hopeless? No Do you cry easily over simple problems? No   Activities of Daily Living  In your present state of health, do you have any difficulty performing the following activities?:  Driving? Yes Managing money? No  Feeding yourself? No  Getting from bed to chair? No  Climbing a flight of stairs? YES  Preparing food and eating?: Yes Bathing or showering? Yes  Getting dressed: Yes  Getting to the toilet? No  Using the toilet:No  Moving around from place to place: Yes  In the past year have you fallen or had a near fall?:Yes  Are you sexually active? No  Do you have more than one partner? No   Hearing Difficulties: Yes  Do you often ask people to speak up or repeat themselves? Yes  Do you experience ringing or noises in your ears? No Do you have difficulty understanding soft or whispered voices? Yes  Do you feel that you have a problem with memory?  Yes Do you often misplace items? Yes  Do you feel safe at home? Yes  Cognitive Testing - PT HAS DEMENTIA DIAGNOSIS Alert? Yes Normal Appearance?Yes  Oriented to person? Yes Place? Yes  Time? Yes  Recall of three objects? No  Can perform simple calculations? Yes  Displays appropriate judgment?Yes  Can read the correct time from a watch face?Yes   List the Names of Other Physician/Practitioners you currently use:    Screening Tests / Date Due for shingles vaccine PNA/TDAP UTD Declines further Mammograms Due for eye visit   ROS: GEN- denies fatigue, fever, weight loss,weakness, recent illness HEENT- denies eye drainage, change in vision, nasal discharge, CVS- denies chest pain, palpitations RESP- denies SOB, cough, wheeze ABD- denies N/V, change in stools, abd pain GU- denies dysuria, hematuria, dribbling, incontinence MSK- denies joint pain, muscle aches, injury Neuro- denies headache, dizziness, syncope, seizure activity  Physical: GEN- NAD, alert and oriented x3 HEENT- PERRL, EOMI, non injected sclera, pink conjunctiva, MMM, oropharynx clear Neck- Supple, no thryomegaly, no JVD  CVS- RRR, no murmur RESP-CTAB ABD-NABS,soft,nT,ND EXT- 1+ non pitting  Edema to mid shins  Pulses- Radial, DP- 2+   Assessment:    Annual wellness medicare exam   Plan:    During the course of the visit the patient was educated and counseled about appropriate screening and preventive services including:    - Shingles vaccine sent to pharmacy -  DM- recent A1C 7.7% at goal for her, which is less than 8%, new meter sent to pharmacy  -  Hyperlipidemia- continue statin low dose, does not tolerate higher doses, check lipids  - Peripheral edema- recurrent problem, advised to take lasix for 3 days, script given for new compression hose will give 15-34mmhg  - history of stroke on plavix, BP controlled  - Daughter to schedule eye exam  - History of rectal cancer, no difficulties with bleeding,  pain, no longer on surveillance - given walker script for regular walker with 2 wheels to be used in home, she has a rollator   RTC 3 months- follow up meds, mobility, give flu shot, recheck renal function                  Patient Instructions (the written plan) was given to the patient.  Medicare Attestation  I have personally reviewed:  The patient's medical and social history  Their use of alcohol, tobacco or illicit drugs  Their current medications and supplements  The patient's functional ability including ADLs,fall risks, home safety risks, cognitive, and hearing and visual impairment  Diet and physical activities  Evidence for depression or mood disorders  The patient's weight, height, BMI, and visual acuity have been recorded in the chart. I have made referrals, counseling, and provided education to the patient based on review of the above and I have provided the patient with a written personalized care plan for preventive services.

## 2017-12-07 NOTE — Patient Instructions (Addendum)
Get the new compression hose  We will call with lab results  Take lasix once a day for next 3 days then as needed  Shingles vaccine sent to pharmacy  Check on Eye doctor appointment  Needs new meter  F/U 3 months MBD

## 2017-12-09 ENCOUNTER — Other Ambulatory Visit: Payer: Self-pay | Admitting: *Deleted

## 2017-12-09 ENCOUNTER — Encounter: Payer: Self-pay | Admitting: Family Medicine

## 2017-12-09 DIAGNOSIS — R350 Frequency of micturition: Secondary | ICD-10-CM | POA: Diagnosis not present

## 2017-12-21 ENCOUNTER — Encounter: Payer: Self-pay | Admitting: *Deleted

## 2018-01-19 ENCOUNTER — Other Ambulatory Visit: Payer: Self-pay | Admitting: Family Medicine

## 2018-01-19 ENCOUNTER — Other Ambulatory Visit: Payer: Self-pay | Admitting: *Deleted

## 2018-01-19 DIAGNOSIS — N3281 Overactive bladder: Secondary | ICD-10-CM

## 2018-01-19 MED ORDER — MYRBETRIQ 50 MG PO TB24: 50.0000 mg | ORAL_TABLET | Freq: Every day | ORAL | 3 refills | Status: DC

## 2018-01-19 MED ORDER — TRIMETHOPRIM 100 MG PO TABS: 100.0000 mg | ORAL_TABLET | Freq: Every day | ORAL | 1 refills | Status: DC

## 2018-02-01 DIAGNOSIS — J029 Acute pharyngitis, unspecified: Secondary | ICD-10-CM | POA: Diagnosis not present

## 2018-02-03 ENCOUNTER — Ambulatory Visit: Payer: Medicare Other | Admitting: Physician Assistant

## 2018-02-10 ENCOUNTER — Ambulatory Visit: Payer: Self-pay | Admitting: Family Medicine

## 2018-02-16 ENCOUNTER — Encounter: Payer: Self-pay | Admitting: Family Medicine

## 2018-04-20 ENCOUNTER — Telehealth: Payer: Self-pay | Admitting: Family Medicine

## 2018-04-20 DIAGNOSIS — N3281 Overactive bladder: Secondary | ICD-10-CM

## 2018-04-20 MED ORDER — MYRBETRIQ 50 MG PO TB24: 50.0000 mg | ORAL_TABLET | Freq: Every day | ORAL | 3 refills | Status: DC

## 2018-04-20 NOTE — Telephone Encounter (Signed)
Patient is not on Oxybutynin.   Myrbetriq refilled.

## 2018-04-20 NOTE — Telephone Encounter (Signed)
Pt needs refill oxybutynin [facility].

## 2018-05-03 ENCOUNTER — Ambulatory Visit (INDEPENDENT_AMBULATORY_CARE_PROVIDER_SITE_OTHER): Payer: Medicare Other | Admitting: Family Medicine

## 2018-05-03 ENCOUNTER — Other Ambulatory Visit: Payer: Self-pay

## 2018-05-03 ENCOUNTER — Encounter: Payer: Self-pay | Admitting: Family Medicine

## 2018-05-03 VITALS — BP 138/72 | HR 84 | Temp 98.3°F | Resp 16 | Ht 62.0 in | Wt 268.0 lb

## 2018-05-03 DIAGNOSIS — R609 Edema, unspecified: Secondary | ICD-10-CM | POA: Diagnosis not present

## 2018-05-03 DIAGNOSIS — N183 Chronic kidney disease, stage 3 unspecified: Secondary | ICD-10-CM

## 2018-05-03 DIAGNOSIS — E1122 Type 2 diabetes mellitus with diabetic chronic kidney disease: Secondary | ICD-10-CM | POA: Diagnosis not present

## 2018-05-03 DIAGNOSIS — I10 Essential (primary) hypertension: Secondary | ICD-10-CM

## 2018-05-03 DIAGNOSIS — R269 Unspecified abnormalities of gait and mobility: Secondary | ICD-10-CM

## 2018-05-03 DIAGNOSIS — G6289 Other specified polyneuropathies: Secondary | ICD-10-CM | POA: Diagnosis not present

## 2018-05-03 DIAGNOSIS — F0150 Vascular dementia without behavioral disturbance: Secondary | ICD-10-CM | POA: Diagnosis not present

## 2018-05-03 MED ORDER — BLOOD GLUCOSE TEST VI STRP: ORAL_STRIP | 1 refills | Status: DC

## 2018-05-03 MED ORDER — BLOOD GLUCOSE SYSTEM PAK KIT: PACK | 1 refills | Status: AC

## 2018-05-03 MED ORDER — MEMANTINE HCL ER 7 MG PO CP24: 7.0000 mg | ORAL_CAPSULE | Freq: Every day | ORAL | 1 refills | Status: DC

## 2018-05-03 MED ORDER — LANCETS MISC: 1 refills | Status: DC

## 2018-05-03 NOTE — Patient Instructions (Addendum)
She can take the Namenda ( Memory)  and pravastatin ( CHolesterol) at bedtime  Take your blood sugar once a day  We will call with results Wear your compression hose Use the Walker F/U 4 months

## 2018-05-03 NOTE — Progress Notes (Signed)
Subjective:    Patient ID: Christine Zavala, female    DOB: 07/06/1933, 84 y.o.   MRN: [id number]  Patient presents for Follow-up (S/P fall on knees) Pt here s/p Fall   She was trying to go around her Rollator which parked in the hallway and she fell in superman position onto the floor, Friday 29th. She had vomiting after laying on the floor for 26minutes, she unsure if she hit head, does not remember any loss of conciousness. She called her son and they came to get her out of the floor. She had bruising on knees, felt sore the next day. She did not take any pain medication Says she feels fine now.  She does not have any particular complaints.   DM- hasnt been checking her CBG-        She does not ave a meter  Has been taking her medicine as prescribed    She has lost 6lbs since July-states that she has been cutting back her portion sizes.  Daughter ask about her worsening memory.  She has known vascular dementia which may have some underlying Alzheimer's as well.  She tried Aricept in the past but had side effects from this medication.  She did not want to try any other medications before.  They are now interested in trying the Namenda to see if this will help with the decline.  She just cannot remember the simplest things.    Review Of Systems:  GEN- denies fatigue, fever, weight loss,weakness, recent illness HEENT- denies eye drainage, change in vision, nasal discharge, CVS- denies chest pain, palpitations RESP- denies SOB, cough, wheeze ABD- denies N/V, change in stools, abd pain GU- denies dysuria, hematuria, dribbling, incontinence MSK- denies joint pain, muscle aches, injury Neuro- denies headache, dizziness, syncope, seizure activity       Objective:    BP 138/72   Pulse 84   Temp 98.3 F (36.8 C) (Oral)   Resp 16   Ht 5\' 2"  (1.575 m)   Wt 268 lb (121.6 kg)   SpO2 98%   BMI 49.02 kg/m  GEN- NAD, alert and oriented x3,obese ,sitting in chair HEENT- PERRL, EOMI, non  injected sclera, pink conjunctiva, MMM, oropharynx clear Neck- Supple, no thryomegaly, no JVD ,fair ROM  CVS- RRR, no murmur RESP-CTAB Psych- normal affect and mood  ABD-NABS,soft,nT,ND EXT- 1+ non pitting  Pedal just above ankles  MSK- Spine NT, decreaed ROM upper and lower ext, Hip NT, knee no effusion, decreased ROM knees/hips Pulses- Radial 2+, DP- diminished      Assessment & Plan:    No appreciable injury from the fall , unclear if vomiting was post concussive or due to trauma of hitting her abdomen directly on the floor Again reiterated life alert bracelet    Problem List Items Addressed This Visit      Unprioritized   Abnormality of gait    Discussed using her walker and removing any barriers that may cause her to trip.      CKD stage 3 due to type 2 diabetes mellitus (HCC)   Dementia (HCC)    Try her on the extended release of the Namenda 7 mg to start with make sure she does not have any side effects.  We will slowly titrate up.      Relevant Medications   memantine (NAMENDA XR) 7 MG CP24 24 hr capsule   Hypertension    Blood pressures well controlled.  Continue the Lasix she is also to use  compression hose which she has at home to help with her lower extremity edema.      Peripheral edema - Primary   Peripheral neuropathy    There is been no significant change in her neuropathy.      Relevant Medications   memantine (NAMENDA XR) 7 MG CP24 24 hr capsule   Type 2 diabetes mellitus with diabetic chronic kidney disease ([place])    Diabetes has been fair controlled.  Would like to keep her A1c less than 8% without any hypoglycemia she is currently on the glimepiride.  We have sent another meter to the pharmacy for them to pick up.      Relevant Orders   CBC with Differential/Platelet (Completed)   Comprehensive metabolic panel (Completed)   Hemoglobin A1c (Completed)      Note: This dictation was prepared with Dragon dictation along with smaller phrase  technology. Any transcriptional errors that result from this process are unintentional.

## 2018-05-04 ENCOUNTER — Encounter: Payer: Self-pay | Admitting: Family Medicine

## 2018-05-04 DIAGNOSIS — N183 Chronic kidney disease, stage 3 unspecified: Secondary | ICD-10-CM | POA: Insufficient documentation

## 2018-05-04 DIAGNOSIS — E1122 Type 2 diabetes mellitus with diabetic chronic kidney disease: Secondary | ICD-10-CM | POA: Insufficient documentation

## 2018-05-04 LAB — CBC WITH DIFFERENTIAL/PLATELET
BASOS ABS: 30 {cells}/uL (ref 0–200)
Basophils Relative: 0.3 %
EOS PCT: 1.8 %
Eosinophils Absolute: 180 cells/uL (ref 15–500)
HCT: 37.1 % (ref 35.0–45.0)
Hemoglobin: 12.4 g/dL (ref 11.7–15.5)
Lymphs Abs: 1550 cells/uL (ref 850–3900)
MCH: 30.8 pg (ref 27.0–33.0)
MCHC: 33.4 g/dL (ref 32.0–36.0)
MCV: 92.1 fL (ref 80.0–100.0)
MPV: 11.4 fL (ref 7.5–12.5)
Monocytes Relative: 5.4 %
Neutro Abs: 7700 cells/uL (ref 1500–7800)
Neutrophils Relative %: 77 %
Platelets: 249 10*3/uL (ref 140–400)
RBC: 4.03 10*6/uL (ref 3.80–5.10)
RDW: 13.8 % (ref 11.0–15.0)
Total Lymphocyte: 15.5 %
WBC mixed population: 540 cells/uL (ref 200–950)
WBC: 10 10*3/uL (ref 3.8–10.8)

## 2018-05-04 LAB — HEMOGLOBIN A1C
Hgb A1c MFr Bld: 8.3 % of total Hgb — ABNORMAL HIGH (ref <5.7)
Mean Plasma Glucose: 192 (calc)
eAG (mmol/L): 10.6 (calc)

## 2018-05-04 LAB — COMPREHENSIVE METABOLIC PANEL
AG Ratio: 1.3 (calc) (ref 1.0–2.5)
ALT: 15 U/L (ref 6–29)
AST: 16 U/L (ref 10–35)
Albumin: 3.7 g/dL (ref 3.6–5.1)
Alkaline phosphatase (APISO): 115 U/L (ref 33–130)
BILIRUBIN TOTAL: 0.8 mg/dL (ref 0.2–1.2)
BUN/Creatinine Ratio: 19 (calc) (ref 6–22)
BUN: 28 mg/dL — ABNORMAL HIGH (ref 7–25)
CALCIUM: 8.7 mg/dL (ref 8.6–10.4)
CO2: 28 mmol/L (ref 20–32)
Chloride: 104 mmol/L (ref 98–110)
Creat: 1.47 mg/dL — ABNORMAL HIGH (ref 0.60–0.88)
Globulin: 2.8 g/dL (calc) (ref 1.9–3.7)
Glucose, Bld: 174 mg/dL — ABNORMAL HIGH (ref 65–99)
Potassium: 4.1 mmol/L (ref 3.5–5.3)
Sodium: 141 mmol/L (ref 135–146)
Total Protein: 6.5 g/dL (ref 6.1–8.1)

## 2018-05-04 NOTE — Assessment & Plan Note (Signed)
Diabetes has been fair controlled.  Would like to keep her A1c less than 8% without any hypoglycemia she is currently on the glimepiride.  We have sent another meter to the pharmacy for them to pick up.

## 2018-05-04 NOTE — Assessment & Plan Note (Signed)
Blood pressures well controlled.  Continue the Lasix she is also to use compression hose which she has at home to help with her lower extremity edema.

## 2018-05-04 NOTE — Assessment & Plan Note (Signed)
There is been no significant change in her neuropathy.

## 2018-05-04 NOTE — Assessment & Plan Note (Signed)
Discussed using her walker and removing any barriers that may cause her to trip.

## 2018-05-04 NOTE — Assessment & Plan Note (Signed)
Try her on the extended release of the Namenda 7 mg to start with make sure she does not have any side effects.  We will slowly titrate up.

## 2018-05-27 ENCOUNTER — Encounter: Payer: Self-pay | Admitting: *Deleted

## 2018-05-27 MED ORDER — GLIMEPIRIDE 1 MG PO TABS: 1.5000 mg | ORAL_TABLET | Freq: Every day | ORAL | 0 refills | Status: DC

## 2018-05-30 ENCOUNTER — Other Ambulatory Visit: Payer: Self-pay | Admitting: Family Medicine

## 2018-06-11 ENCOUNTER — Telehealth: Payer: Self-pay | Admitting: Family Medicine

## 2018-06-11 NOTE — Telephone Encounter (Signed)
Pt needs refill on glimepiride, furosemide, trimethoprim, pravastatin, clopidogrel, and mobic to [facility].

## 2018-06-14 MED ORDER — TRIMETHOPRIM 100 MG PO TABS: 100.0000 mg | ORAL_TABLET | Freq: Every day | ORAL | 1 refills | Status: DC

## 2018-06-14 MED ORDER — CLOPIDOGREL BISULFATE 75 MG PO TABS: 75.0000 mg | ORAL_TABLET | Freq: Every day | ORAL | 2 refills | Status: DC

## 2018-06-14 MED ORDER — GLIMEPIRIDE 1 MG PO TABS: 1.5000 mg | ORAL_TABLET | Freq: Every day | ORAL | 0 refills | Status: DC

## 2018-06-14 MED ORDER — FUROSEMIDE 40 MG PO TABS: 40.0000 mg | ORAL_TABLET | Freq: Every day | ORAL | 3 refills | Status: DC | PRN

## 2018-06-14 MED ORDER — PRAVASTATIN SODIUM 80 MG PO TABS: 80.0000 mg | ORAL_TABLET | Freq: Every day | ORAL | 3 refills | Status: DC

## 2018-06-14 NOTE — Telephone Encounter (Signed)
Pt needs refill on glimepiride, furosemide, trimethoprim, pravastatin, clopidogrel, Pt needs refill on glimepiride, furosemide, trimethoprim, pravastatin, clopidogrel, and mobic to [facility]. and mobic to [facility].

## 2018-06-14 NOTE — Telephone Encounter (Signed)
Prescription sent to pharmacy.   Patient is currently not on Mobic.

## 2018-06-29 ENCOUNTER — Ambulatory Visit (INDEPENDENT_AMBULATORY_CARE_PROVIDER_SITE_OTHER): Payer: Medicare Other | Admitting: Family Medicine

## 2018-06-29 VITALS — BP 128/82 | HR 106 | Temp 97.5°F | Ht 62.0 in | Wt 262.0 lb

## 2018-06-29 DIAGNOSIS — R300 Dysuria: Secondary | ICD-10-CM

## 2018-06-29 LAB — URINALYSIS, ROUTINE W REFLEX MICROSCOPIC
BILIRUBIN URINE: NEGATIVE
Glucose, UA: NEGATIVE
Hgb urine dipstick: NEGATIVE
Ketones, ur: NEGATIVE
Leukocytes, UA: NEGATIVE
Nitrite: NEGATIVE
Protein, ur: NEGATIVE
Specific Gravity, Urine: 1.015 (ref 1.001–1.03)
pH: 7 (ref 5.0–8.0)

## 2018-06-29 MED ORDER — CEPHALEXIN 500 MG PO CAPS: 500.0000 mg | ORAL_CAPSULE | Freq: Three times a day (TID) | ORAL | 0 refills | Status: AC

## 2018-06-29 NOTE — Progress Notes (Signed)
Patient ID: Christine Zavala, female    DOB: [birth year], 85 y.o.   MRN: [id number]  PCP: Alycia Rossetti, MD  Chief Complaint  Patient presents with  . Urinary Tract Infection    Patient in today with urinary hestiancy, low back pain, painful urination    Subjective:   Christine Zavala is a 85 y.o. female, presents to clinic with CC of hematuria, dysuria, urinary frequency and low abd discomfort, onset 3-4 days ago.  Pt is here with her daughter - she helps give some history.  She believes her mother is slightly more off balance than normal and a little bit more confused than normal.  Although patient states she is drinking a lot patient daughter is concerned that she may be a little bit dry as well.  She has history of some mild incontinence, that has not changed from her baseline but the patient did tell her daughter that on Saturday few days ago there was blood in the depends, and she seems to have increased urgency, decreased frequency and pain which she points to her suprapubic and left lower quadrant area.  She denies any fever chills sweats change in appetite.  Denies any back pain, nausea, vomiting, diarrhea. Last UTI was about 6 months ago.   Patient Active Problem List   Diagnosis Date Noted  . CKD stage 3 due to type 2 diabetes mellitus ([place]) 05/04/2018  . Acute on chronic renal failure ([place]) 10/22/2017  . Generalized weakness 05/16/2016  . Vertigo 06/27/2015  . Abnormality of gait 04/12/2015  . Constipation 08/21/2014  . Itchy scalp 08/21/2014  . Osteoarthritis 03/06/2014  . Peripheral neuropathy 03/06/2014  . Varicose veins 03/06/2014  . DDD (degenerative disc disease), lumbar 11/29/2013  . Renal cyst 11/29/2013  . OAB (overactive bladder) 08/24/2013  . Osteoporosis, unspecified   . Peripheral edema 11/15/2012  . History of rectal cancer   . Hyperlipidemia   . Dementia ([place])   . History of CVA (cerebrovascular accident)   . Hypertension   . Type 2 diabetes mellitus with  diabetic chronic kidney disease ([place])      Prior to Admission medications   Medication Sig Start Date End Date Taking? Authorizing Provider  acetaminophen (TYLENOL) 500 MG tablet Take 1,000 mg by mouth as needed for mild pain.   Yes [provider]  Blood Glucose Monitoring Suppl (BLOOD GLUCOSE SYSTEM PAK) KIT Please dispense based on patient and insurance preference. Use as directed to monitor FSBS 2x daily. Dx: E11.9. 05/03/18  Yes Whitten, Modena Nunnery, MD  clopidogrel (PLAVIX) 75 MG tablet Take 1 tablet (75 mg total) by mouth daily. 06/14/18  Yes [place], Modena Nunnery, MD  furosemide (LASIX) 40 MG tablet Take 1 tablet (40 mg total) by mouth daily as needed. 06/14/18  Yes [place], Modena Nunnery, MD  glimepiride (AMARYL) 1 MG tablet Take 1.5 tablets (1.5 mg total) by mouth daily with breakfast. 06/14/18  Yes [place], Modena Nunnery, MD  Glucose Blood (BLOOD GLUCOSE TEST STRIPS) STRP Please dispense based on patient and insurance preference. Use as directed to monitor FSBS 2x daily. Dx: E11.9. 05/03/18  Yes [place], Modena Nunnery, MD  Lancets MISC Please dispense based on patient and insurance preference. Use as directed to monitor FSBS 2x daily. Dx: E11.9. 05/03/18  Yes Alycia Rossetti, MD  memantine (NAMENDA XR) 7 MG CP24 24 hr capsule TAKE 1 CAPSULE BY MOUTH AT BEDTIME 05/31/18  Yes Hadley, Modena Nunnery, MD  MYRBETRIQ 50 MG TB24 tablet Take 1 tablet (  50 mg total) by mouth daily. 04/20/18  Yes Campbell, Kawanta F, MD  pravastatin (PRAVACHOL) 80 MG tablet Take 1 tablet (80 mg total) by mouth daily. 06/14/18  Yes Ewing, Kawanta F, MD  trimethoprim (TRIMPEX) 100 MG tablet Take 1 tablet (100 mg total) by mouth daily. Prophylaxis 06/14/18  Yes [place], Kawanta F, MD     Allergies  Allergen Reactions  . Aspirin Nausea And Vomiting  . Calcium-Containing Compounds     Legs burn  . Codeine Sulfate Nausea And Vomiting  . Lipitor [Atorvastatin]     myalgias  . Zocor [Simvastatin]     pruritis  . Zyrtec [Cetirizine]       Family History  Problem Relation Age of Onset  . Cancer Mother        Pancreatic  . COPD Mother   . Dementia Father      Social History   Socioeconomic History  . Marital status: Widowed    Spouse name: Not on file  . Number of children: 2  . Years of education: 12  . Highest education level: Not on file  Occupational History  . Occupation: retired    Employer: RETIRED  Social Needs  . Financial resource strain: Not on file  . Food insecurity:    Worry: Not on file    Inability: Not on file  . Transportation needs:    Medical: Not on file    Non-medical: Not on file  Tobacco Use  . Smoking status: Former Smoker  . Smokeless tobacco: Never Used  . Tobacco comment: quit 1970's  Substance and Sexual Activity  . Alcohol use: No  . Drug use: No  . Sexual activity: Not on file  Lifestyle  . Physical activity:    Days per week: Not on file    Minutes per session: Not on file  . Stress: Not on file  Relationships  . Social connections:    Talks on phone: Not on file    Gets together: Not on file    Attends religious service: Not on file    Active member of club or organization: Not on file    Attends meetings of clubs or organizations: Not on file    Relationship status: Not on file  . Intimate partner violence:    Fear of current or ex partner: Not on file    Emotionally abused: Not on file    Physically abused: Not on file    Forced sexual activity: Not on file  Other Topics Concern  . Not on file  Social History Narrative   Patient lives at home with her Son (Tracy Wheeler). Patient is a widowed. Patient is retired. Right handed.   Caffeine - one cup daily     Review of Systems  Constitutional: Negative.   HENT: Negative.   Eyes: Negative.   Respiratory: Negative.   Cardiovascular: Negative.   Gastrointestinal: Negative.   Endocrine: Negative.   Genitourinary: Negative.   Musculoskeletal: Negative.   Skin: Negative.   Allergic/Immunologic:  Negative.   Neurological: Negative.   Hematological: Negative.   Psychiatric/Behavioral: Negative.   All other systems reviewed and are negative.      Objective:    Vitals:   06/29/18 1222  BP: 128/82  Pulse: (!) 106  Temp: (!) 97.5 F (36.4 C)  TempSrc: Oral  SpO2: 97%  Weight: 262 lb (118.8 kg)  Height: 5' 2" (1.575 m)    HR rechecked at time of exam  - HR 88    Physical Exam Vitals signs and nursing note reviewed.  Constitutional:      General: She is not in acute distress.    Appearance: She is well-developed. She is obese. She is not toxic-appearing.     Comments: Elderly female, non-toxic appearing  HENT:     Head: Normocephalic and atraumatic.     Right Ear: External ear normal.     Left Ear: External ear normal.     Nose: Nose normal. No rhinorrhea.     Mouth/Throat:     Mouth: Mucous membranes are dry.     Pharynx: Oropharynx is clear. No oropharyngeal exudate or posterior oropharyngeal erythema.  Eyes:     General:        Right eye: No discharge.        Left eye: No discharge.     Conjunctiva/sclera: Conjunctivae normal.  Neck:     Musculoskeletal: Normal range of motion.     Trachea: No tracheal deviation.  Cardiovascular:     Rate and Rhythm: Normal rate and regular rhythm.     Pulses: Normal pulses.     Heart sounds: Normal heart sounds. No murmur. No friction rub. No gallop.   Pulmonary:     Effort: Pulmonary effort is normal. No respiratory distress.     Breath sounds: Normal breath sounds. No stridor.  Abdominal:     General: Bowel sounds are normal. There is no distension.     Tenderness: There is no abdominal tenderness. There is no right CVA tenderness, left CVA tenderness, guarding or rebound.     Comments: Abdomen obese, soft, no ttp  Skin:    General: Skin is warm and dry.     Coloration: Skin is not pale.     Findings: No rash.  Neurological:     Mental Status: She is alert.     Motor: No weakness or abnormal muscle tone.      Coordination: Coordination normal.     Gait: Gait abnormal (walking with cane).     Comments: A&O to person and place, some confusion with time  Psychiatric:        Mood and Affect: Mood normal.        Behavior: Behavior normal.           Assessment & Plan:      ICD-10-CM   1. Dysuria R30.0 Urinalysis, Routine w reflex microscopic    Urine Culture    cephALEXin (KEFLEX) 500 MG capsule    Elderly female with hematuria and dysuria with urgency and mild abd discomfort.  Hx of UTI, last noted per chart review was about 6 months ago.  Pt is a little more confused than her baseline and gait is a little more unsteady than baseline - but vital signs are stable (HR rechecked and normal when resting in exam room) she is well-appearing her daughter is with here she is not having fever, nausea vomiting decreased appetite.  On exam no abdominal tenderness to palpation.  Will cover for UTI, pt lives with son and daughter will be checking on her, encouraged to push fluids, f/up immediately if any worsening or if not tolerating meds PO.  Urine culture pending.     Delsa Grana, PA-C 06/29/18 12:34 PM

## 2018-06-30 LAB — URINE CULTURE
MICRO NUMBER:: 148020
SPECIMEN QUALITY:: ADEQUATE

## 2018-08-11 ENCOUNTER — Other Ambulatory Visit: Payer: Self-pay | Admitting: Family Medicine

## 2018-09-06 ENCOUNTER — Encounter: Payer: Self-pay | Admitting: Family Medicine

## 2018-09-06 ENCOUNTER — Ambulatory Visit (INDEPENDENT_AMBULATORY_CARE_PROVIDER_SITE_OTHER): Payer: Medicare Other | Admitting: Family Medicine

## 2018-09-06 ENCOUNTER — Other Ambulatory Visit: Payer: Self-pay

## 2018-09-06 ENCOUNTER — Ambulatory Visit: Payer: Medicare Other | Admitting: Family Medicine

## 2018-09-06 DIAGNOSIS — R609 Edema, unspecified: Secondary | ICD-10-CM

## 2018-09-06 DIAGNOSIS — I129 Hypertensive chronic kidney disease with stage 1 through stage 4 chronic kidney disease, or unspecified chronic kidney disease: Secondary | ICD-10-CM

## 2018-09-06 DIAGNOSIS — Z8673 Personal history of transient ischemic attack (TIA), and cerebral infarction without residual deficits: Secondary | ICD-10-CM | POA: Diagnosis not present

## 2018-09-06 DIAGNOSIS — E1122 Type 2 diabetes mellitus with diabetic chronic kidney disease: Secondary | ICD-10-CM

## 2018-09-06 DIAGNOSIS — I10 Essential (primary) hypertension: Secondary | ICD-10-CM

## 2018-09-06 DIAGNOSIS — F0150 Vascular dementia without behavioral disturbance: Secondary | ICD-10-CM | POA: Diagnosis not present

## 2018-09-06 DIAGNOSIS — N183 Chronic kidney disease, stage 3 unspecified: Secondary | ICD-10-CM

## 2018-09-06 NOTE — Progress Notes (Signed)
Virtual Visit via Telephone Note  I connected with Christine Zavala on 09/06/18 at 1:08  by telephone and verified that I am speaking with the correct person using two identifiers.  Pt location: at home  Physician location: in office   On Phone Call- I spoke with Daughter [name] Carty/ Patient on 2nd part of call call    I discussed the limitations, risks, security and privacy concerns of performing an evaluation and management service by telephone and the availability of in person appointments. I also discussed with the patient that there may be a patient responsible charge related to this service. The patient expressed understanding and agreed to proceed.   History of Present Illness:  Dementia- taking Nameda as prescribed but they continue to see decline.  She also asked the same question over and over again.  She has not been verbally abusive for physical but she often will refuse to have her blood sugar taken and she requires help with ADLs such as cooking cleaning and taking a bath.  She has not had any recent falls but she prefers to use her cane instead of the walker which we have discussed before.  Peripheral edema her legs overall look good right now.  She is trying to elevate them.  Diabetes she is not checking her blood sugars per above.  But her daughter feels like she has been losing some weight she has been cutting back on her portion size and not eating as much sugar since she cannot get out of the house.  She is however drinking a lot of diet Pepsi  With patient see states she is doing well.  She does not have any particular concerns.  She admits that she has been drinking a lot of soda but otherwise doing good.  He has been staying within her home she does not have any respiratory symptoms   Observations/Objective: Telephone visit  Assessment and Plan: Diabetes mellitus-per check her blood sugar her son who lives with her can also check it.  Goal is to keep her fastings at least  less than 150 or A1c less than 8% without any hypoglycemia symptoms.  Going to continue the glimepiride 1.5 mg daily for now. Discussed getting off of the Pepsi she can drink ginger ale every now and then. Dementia continue with the Namenda  Recurrent urinary tract infection and incontinence continue with Myrbetriq and trimethoprim   History of coronary artery disease carotid artery stenosis continue with Plavix as well as statin drug pravastatin  No blood pressure readings taken at home but no change in medication  Her edema is down continue to elevate legs wear compression hose  Follow Up Instructions: F/U Friday with blood sugar readings    I discussed the assessment and treatment plan with the patient. The patient was provided an opportunity to ask questions and all were answered. The patient agreed with the plan and demonstrated an understanding of the instructions.   The patient was advised to call back or seek an in-person evaluation if the symptoms worsen or if the condition fails to improve as anticipated.  I provided 14 minutes of non-face-to-face time during this encounter. End Time: 1:22pm  Vic Blackbird, MD

## 2018-09-07 ENCOUNTER — Telehealth: Payer: Self-pay | Admitting: *Deleted

## 2018-09-07 MED ORDER — LORATADINE 10 MG PO TABS: 10.0000 mg | ORAL_TABLET | Freq: Every day | ORAL | 11 refills | Status: DC

## 2018-09-07 NOTE — Telephone Encounter (Signed)
Loratadine 10mg  OTC

## 2018-09-07 NOTE — Telephone Encounter (Signed)
Received call from patient daughter [name] [phone] telephone.   Reports that patient awoke today with C/O scratchy throat. No other Sx noted. States that she feels patient is having issues with allergies.   Inquired as to what patient can safely take in conjunction with her medications. MD please advise.

## 2018-09-07 NOTE — Telephone Encounter (Signed)
Call placed to patient and patient daughter [name] made aware.   Medication list updated.

## 2018-09-08 ENCOUNTER — Other Ambulatory Visit: Payer: Self-pay | Admitting: Family Medicine

## 2018-09-15 ENCOUNTER — Other Ambulatory Visit: Payer: Self-pay | Admitting: *Deleted

## 2018-09-15 ENCOUNTER — Telehealth: Payer: Self-pay | Admitting: *Deleted

## 2018-09-15 DIAGNOSIS — N183 Chronic kidney disease, stage 3 (moderate): Principal | ICD-10-CM

## 2018-09-15 DIAGNOSIS — E1122 Type 2 diabetes mellitus with diabetic chronic kidney disease: Secondary | ICD-10-CM

## 2018-09-15 MED ORDER — GLIMEPIRIDE 1 MG PO TABS: 2.0000 mg | ORAL_TABLET | Freq: Every day | ORAL | 0 refills | Status: DC

## 2018-09-15 NOTE — Telephone Encounter (Signed)
Call placed to patient and patient daughter made aware per VM.  Future labs ordered.

## 2018-09-15 NOTE — Telephone Encounter (Signed)
Received call from patient daughter, [name]. [phone] telephone.   Reports that patient CBG noted at 390 this AM.   Call placed to obtain more information. [place].

## 2018-09-15 NOTE — Telephone Encounter (Signed)
She is eating too many sweets and carbs Increase water She should have taken her amaryl for this morning 1.5 tabs Give her 1 more tablet Keep checking blood sugars fasting, and before meals and bedtime   Starting tomorrow, give her 2 tablets in the morning She needs to come in tomorrow and get labs- A1C, BMET,CBC  I am concerns her blood A1C is extremely high , no OV, just labs

## 2018-09-15 NOTE — Telephone Encounter (Signed)
Patient daughter [name] returned call.   States that FSBS was fasting this AM.   Reports that patient is generally lethargic and thirsty.   States that patient had [place] dinner, chocolate ice cream, and a fish sandwich on a whole wheat bun yesterday.

## 2018-09-16 ENCOUNTER — Other Ambulatory Visit: Payer: Self-pay

## 2018-09-16 ENCOUNTER — Other Ambulatory Visit: Payer: Medicare Other

## 2018-09-16 DIAGNOSIS — N183 Chronic kidney disease, stage 3 (moderate): Secondary | ICD-10-CM | POA: Diagnosis not present

## 2018-09-16 DIAGNOSIS — E1122 Type 2 diabetes mellitus with diabetic chronic kidney disease: Secondary | ICD-10-CM | POA: Diagnosis not present

## 2018-09-17 ENCOUNTER — Other Ambulatory Visit: Payer: Self-pay | Admitting: *Deleted

## 2018-09-17 LAB — CBC WITH DIFFERENTIAL/PLATELET
Absolute Monocytes: 585 cells/uL (ref 200–950)
Basophils Absolute: 36 cells/uL (ref 0–200)
Basophils Relative: 0.4 %
Eosinophils Absolute: 243 cells/uL (ref 15–500)
Eosinophils Relative: 2.7 %
HCT: 40.3 % (ref 35.0–45.0)
Hemoglobin: 13.1 g/dL (ref 11.7–15.5)
Lymphs Abs: 1611 cells/uL (ref 850–3900)
MCH: 30 pg (ref 27.0–33.0)
MCHC: 32.5 g/dL (ref 32.0–36.0)
MCV: 92.2 fL (ref 80.0–100.0)
MPV: 11.9 fL (ref 7.5–12.5)
Monocytes Relative: 6.5 %
Neutro Abs: 6525 cells/uL (ref 1500–7800)
Neutrophils Relative %: 72.5 %
Platelets: 243 10*3/uL (ref 140–400)
RBC: 4.37 10*6/uL (ref 3.80–5.10)
RDW: 13.3 % (ref 11.0–15.0)
Total Lymphocyte: 17.9 %
WBC: 9 10*3/uL (ref 3.8–10.8)

## 2018-09-17 LAB — BASIC METABOLIC PANEL
BUN/Creatinine Ratio: 13 (calc) (ref 6–22)
BUN: 20 mg/dL (ref 7–25)
CO2: 25 mmol/L (ref 20–32)
Calcium: 9.2 mg/dL (ref 8.6–10.4)
Chloride: 101 mmol/L (ref 98–110)
Creat: 1.56 mg/dL — ABNORMAL HIGH (ref 0.60–0.88)
Glucose, Bld: 306 mg/dL — ABNORMAL HIGH (ref 65–99)
Potassium: 3.7 mmol/L (ref 3.5–5.3)
Sodium: 139 mmol/L (ref 135–146)

## 2018-09-17 LAB — HEMOGLOBIN A1C
Hgb A1c MFr Bld: 11.3 % of total Hgb — ABNORMAL HIGH (ref <5.7)
Mean Plasma Glucose: 278 (calc)
eAG (mmol/L): 15.4 (calc)

## 2018-09-17 MED ORDER — GLIMEPIRIDE 1 MG PO TABS: 2.0000 mg | ORAL_TABLET | Freq: Every day | ORAL | 0 refills | Status: DC

## 2018-09-17 MED ORDER — SITAGLIPTIN PHOSPHATE 25 MG PO TABS: 25.0000 mg | ORAL_TABLET | Freq: Every day | ORAL | 1 refills | Status: DC

## 2018-09-20 ENCOUNTER — Other Ambulatory Visit: Payer: Self-pay | Admitting: *Deleted

## 2018-09-20 ENCOUNTER — Telehealth: Payer: Self-pay | Admitting: *Deleted

## 2018-09-20 ENCOUNTER — Other Ambulatory Visit: Payer: Self-pay

## 2018-09-20 DIAGNOSIS — R8290 Unspecified abnormal findings in urine: Secondary | ICD-10-CM

## 2018-09-20 DIAGNOSIS — R350 Frequency of micturition: Secondary | ICD-10-CM

## 2018-09-20 MED ORDER — CLOPIDOGREL BISULFATE 75 MG PO TABS: 75.0000 mg | ORAL_TABLET | Freq: Every day | ORAL | 2 refills | Status: DC

## 2018-09-20 NOTE — Telephone Encounter (Signed)
noted 

## 2018-09-20 NOTE — Telephone Encounter (Signed)
Received call from patient daughter, [name] [phone] telephone.   Reports that she has noted patient is fatigued, urinating frequently, and urine has very dark color and foul odor. States that she thought at first it may be related to uncontrolled DM, but Sx have worsened.   Advised to bring urine sample to office for UA.   Of note, FSBS this AM noted at 209.

## 2018-09-23 ENCOUNTER — Other Ambulatory Visit: Payer: Self-pay | Admitting: *Deleted

## 2018-09-23 MED ORDER — SITAGLIPTIN PHOSPHATE 25 MG PO TABS: 25.0000 mg | ORAL_TABLET | Freq: Every day | ORAL | 1 refills | Status: DC

## 2018-09-24 ENCOUNTER — Other Ambulatory Visit: Payer: Medicare Other

## 2018-09-24 ENCOUNTER — Other Ambulatory Visit: Payer: Self-pay | Admitting: Family Medicine

## 2018-09-24 ENCOUNTER — Other Ambulatory Visit: Payer: Self-pay

## 2018-09-24 DIAGNOSIS — R350 Frequency of micturition: Secondary | ICD-10-CM

## 2018-09-24 DIAGNOSIS — R8290 Unspecified abnormal findings in urine: Secondary | ICD-10-CM

## 2018-09-24 LAB — URINALYSIS, ROUTINE W REFLEX MICROSCOPIC
Bilirubin Urine: NEGATIVE
Glucose, UA: NEGATIVE
Hgb urine dipstick: NEGATIVE
Hyaline Cast: NONE SEEN /LPF
Ketones, ur: NEGATIVE
Nitrite: POSITIVE — AB
Protein, ur: NEGATIVE
RBC / HPF: NONE SEEN /HPF (ref 0–2)
Specific Gravity, Urine: 1.02 (ref 1.001–1.03)
Squamous Epithelial / HPF: NONE SEEN /HPF (ref <=5)
pH: 6 (ref 5.0–8.0)

## 2018-09-24 LAB — MICROSCOPIC MESSAGE

## 2018-09-24 MED ORDER — CEPHALEXIN 500 MG PO CAPS: 500.0000 mg | ORAL_CAPSULE | Freq: Three times a day (TID) | ORAL | 0 refills | Status: AC

## 2018-09-26 LAB — URINE CULTURE
MICRO NUMBER:: 438703
SPECIMEN QUALITY:: ADEQUATE

## 2018-10-13 ENCOUNTER — Other Ambulatory Visit: Payer: Self-pay | Admitting: Family Medicine

## 2018-10-14 ENCOUNTER — Ambulatory Visit
Admission: RE | Admit: 2018-10-14 | Discharge: 2018-10-14 | Disposition: A | Discharge status: Home / Self Care | Payer: Medicare Other | Source: Ambulatory Visit | Attending: Family Medicine | Admitting: Family Medicine

## 2018-10-14 ENCOUNTER — Encounter: Payer: Self-pay | Admitting: Family Medicine

## 2018-10-14 ENCOUNTER — Ambulatory Visit (INDEPENDENT_AMBULATORY_CARE_PROVIDER_SITE_OTHER): Payer: Medicare Other | Admitting: Family Medicine

## 2018-10-14 ENCOUNTER — Other Ambulatory Visit: Payer: Self-pay

## 2018-10-14 VITALS — BP 120/68 | HR 82 | Temp 98.7°F | Resp 18 | Ht 62.0 in

## 2018-10-14 DIAGNOSIS — R410 Disorientation, unspecified: Secondary | ICD-10-CM | POA: Diagnosis not present

## 2018-10-14 DIAGNOSIS — M79672 Pain in left foot: Secondary | ICD-10-CM | POA: Diagnosis not present

## 2018-10-14 DIAGNOSIS — N390 Urinary tract infection, site not specified: Secondary | ICD-10-CM | POA: Diagnosis not present

## 2018-10-14 LAB — URINALYSIS, ROUTINE W REFLEX MICROSCOPIC
Bilirubin Urine: NEGATIVE
Glucose, UA: NEGATIVE
Hgb urine dipstick: NEGATIVE
Ketones, ur: NEGATIVE
Leukocytes,Ua: NEGATIVE
Nitrite: NEGATIVE
Protein, ur: NEGATIVE
Specific Gravity, Urine: 1.02 (ref 1.001–1.03)
pH: 6 (ref 5.0–8.0)

## 2018-10-14 NOTE — Progress Notes (Signed)
Patient ID: Arnetha Silverthorne, female    DOB: [birth year], 85 y.o.   MRN: [id number]  PCP: Alycia Rossetti, MD  Chief Complaint  Patient presents with  . Foot Pain    xL foot 3 days, pt states glucose high  . Edema    L foot    Subjective:   [place] Sarinana is a 85 y.o. female, presents to clinic with CC of left foot with pain and a little swelling, feels like someone ran over it, top lateral side of foot, but no known trauma.  It "hurts" throbs and is worse with palpation or any weight bearing (pt does walk with cane or walker) she is in clinic in a wheelchair.  She does have a history of bilateral lower extremity edema but her swelling has been fairly well-controlled recently other than a little bit of swelling over the dorsal left foot there is no other areas of swelling in her feet ankles or shins bilaterally nothing acutely different from her baseline.  No bruising or redness, no pallor no numbness tingling.   She is a poorly controlled diabetic but she is here with her daughter and state that they have been working on getting her sugars under control the used to on average be around 300 now they are lower but still high recently have been around 180.  The patient did eat today and has not taken her sugar this morning.   Patient's daughter notes that she is "little more confused than usual" she also did have a UTI about 3 weeks ago that was treated with antibiotics.  The daughter states that her confusion has been generally worse than what it was and that has been gradual and no sudden changes over the last 3 months.  It did get a little bit better when she was treated with antibiotics for UTI.  The patient denies any dysuria, hematuria, urinary frequency, urgency, abdominal pain, flank pain, and decreased appetite, fever chills sweats nausea vomiting.  When the patient and her daughter discussed what she is confused about its things that they laugh about in the exam room such as the buttons on a new  remote control to a TV or buttons that control chair.    Patient Active Problem List   Diagnosis Date Noted  . CKD stage 3 due to type 2 diabetes mellitus ([place]) 05/04/2018  . Acute on chronic renal failure ([place]) 10/22/2017  . Generalized weakness 05/16/2016  . Vertigo 06/27/2015  . Abnormality of gait 04/12/2015  . Constipation 08/21/2014  . Itchy scalp 08/21/2014  . Osteoarthritis 03/06/2014  . Peripheral neuropathy 03/06/2014  . Varicose veins 03/06/2014  . DDD (degenerative disc disease), lumbar 11/29/2013  . Renal cyst 11/29/2013  . OAB (overactive bladder) 08/24/2013  . Osteoporosis, unspecified   . Peripheral edema 11/15/2012  . History of rectal cancer   . Hyperlipidemia   . Dementia ([place])   . History of CVA (cerebrovascular accident)   . Hypertension   . Type 2 diabetes mellitus with diabetic chronic kidney disease ([place])      Prior to Admission medications   Medication Sig Start Date End Date Taking? Authorizing Provider  acetaminophen (TYLENOL) 500 MG tablet Take 1,000 mg by mouth as needed for mild pain.   Yes [provider]  Blood Glucose Monitoring Suppl (BLOOD GLUCOSE SYSTEM PAK) KIT Please dispense based on patient and insurance preference. Use as directed to monitor FSBS 2x daily. Dx: E11.9. 05/03/18  Yes Alycia Rossetti, MD  clopidogrel (PLAVIX) 75 MG tablet Take 1 tablet (75 mg total) by mouth daily. 09/20/18  Yes [place], Modena Nunnery, MD  furosemide (LASIX) 40 MG tablet Take 1 tablet (40 mg total) by mouth daily as needed. 06/14/18  Yes [place], Modena Nunnery, MD  glimepiride (AMARYL) 1 MG tablet Take 2 tablets (2 mg total) by mouth daily with breakfast. 09/17/18  Yes [place], Modena Nunnery, MD  Glucose Blood (BLOOD GLUCOSE TEST STRIPS) STRP Please dispense based on patient and insurance preference. Use as directed to monitor FSBS 2x daily. Dx: E11.9. 05/03/18  Yes [place], Modena Nunnery, MD  JANUVIA 25 MG tablet TAKE 1 TABLET BY MOUTH EVERY DAY 10/13/18  Yes [place],  Modena Nunnery, MD  Lancets MISC Please dispense based on patient and insurance preference. Use as directed to monitor FSBS 2x daily. Dx: E11.9. 05/03/18  Yes [place], Modena Nunnery, MD  loratadine (CLARITIN) 10 MG tablet Take 1 tablet (10 mg total) by mouth daily. 09/07/18  Yes [place], Modena Nunnery, MD  memantine (NAMENDA XR) 7 MG CP24 24 hr capsule TAKE 1 CAPSULE BY MOUTH AT BEDTIME 05/31/18  Yes [place], Modena Nunnery, MD  MYRBETRIQ 50 MG TB24 tablet Take 1 tablet (50 mg total) by mouth daily. 04/20/18  Yes [place], Modena Nunnery, MD  pravastatin (PRAVACHOL) 80 MG tablet Take 1 tablet (80 mg total) by mouth daily. 06/14/18  Yes [place], Modena Nunnery, MD  trimethoprim (TRIMPEX) 100 MG tablet Take 1 tablet (100 mg total) by mouth daily. Prophylaxis 06/14/18  Yes , Modena Nunnery, MD     Allergies  Allergen Reactions  . Aspirin Nausea And Vomiting  . Calcium-Containing Compounds     Legs burn  . Codeine Sulfate Nausea And Vomiting  . Lipitor [Atorvastatin]     myalgias  . Zocor [Simvastatin]     pruritis  . Zyrtec [Cetirizine]      Family History  Problem Relation Age of Onset  . Cancer Mother        Pancreatic  . COPD Mother   . Dementia Father      Social History   Socioeconomic History  . Marital status: Widowed    Spouse name: Not on file  . Number of children: 2  . Years of education: 38  . Highest education level: Not on file  Occupational History  . Occupation: retired    Fish farm manager: RETIRED  Social Needs  . Financial resource strain: Not on file  . Food insecurity:    Worry: Not on file    Inability: Not on file  . Transportation needs:    Medical: Not on file    Non-medical: Not on file  Tobacco Use  . Smoking status: Former Research scientist (life sciences)  . Smokeless tobacco: Never Used  . Tobacco comment: quit 1970's  Substance and Sexual Activity  . Alcohol use: No  . Drug use: No  . Sexual activity: Not on file  Lifestyle  . Physical activity:    Days per week: Not on file    Minutes per session:  Not on file  . Stress: Not on file  Relationships  . Social connections:    Talks on phone: Not on file    Gets together: Not on file    Attends religious service: Not on file    Active member of club or organization: Not on file    Attends meetings of clubs or organizations: Not on file    Relationship status: Not on file  . Intimate partner violence:    Fear  of current or ex partner: Not on file    Emotionally abused: Not on file    Physically abused: Not on file    Forced sexual activity: Not on file  Other Topics Concern  . Not on file  Social History Narrative   Patient lives at home with her Son Sheliah Plane). Patient is a widowed. Patient is retired. Right handed.   Caffeine - one cup daily     Review of Systems  Constitutional: Negative.   HENT: Negative.   Eyes: Negative.   Respiratory: Negative.   Cardiovascular: Negative.   Gastrointestinal: Negative.   Endocrine: Negative.   Genitourinary: Negative.   Musculoskeletal: Negative.   Skin: Negative.   Allergic/Immunologic: Negative.   Neurological: Negative.   Hematological: Negative.   Psychiatric/Behavioral: Negative.   All other systems reviewed and are negative.      Objective:    Vitals:   10/14/18 1121  BP: 120/68  Pulse: 82  Resp: 18  Temp: 98.7 F (37.1 C)  SpO2: 96%  Height: _0  (1.575 m)      Physical Exam Vitals signs and nursing note reviewed.  Constitutional:      Appearance: She is well-developed.  HENT:     Head: Normocephalic and atraumatic.     Nose: Nose normal.  Eyes:     General:        Right eye: No discharge.        Left eye: No discharge.     Conjunctiva/sclera: Conjunctivae normal.  Neck:     Trachea: No tracheal deviation.  Cardiovascular:     Rate and Rhythm: Normal rate and regular rhythm.     Pulses: Normal pulses.     Comments: Mild b/l LE edema vs obesity? Non-pitting 2+ b/l PT and DP pulses, skin warm to the touch, normal capillary refill Pulmonary:      Effort: Pulmonary effort is normal. No respiratory distress.     Breath sounds: No stridor.  Musculoskeletal:     Left ankle: She exhibits normal range of motion, no swelling, no ecchymosis, no deformity, no laceration and normal pulse. No tenderness. No lateral malleolus, no medial malleolus, no AITFL, no CF ligament, no posterior TFL, no head of 5th metatarsal and no proximal fibula tenderness found. Achilles tendon normal.     Left lower leg: Normal. She exhibits no tenderness, no bony tenderness and no swelling. No edema.     Left foot: Normal capillary refill. Tenderness, bony tenderness and swelling present. No crepitus, deformity or laceration.       Feet:     Comments: Circled area on the left dorsal foot is area of tenderness and mild edema with skin color normal no pallor, erythema bruising or ecchymosis Tenderness to palpation Normal active and passive range of motion but visible grimace with doing strength testing with dorsiflexion and plantarflexion, in wheelchair, did not assess gait  Skin:    General: Skin is warm and dry.     Findings: No rash.  Neurological:     Mental Status: She is alert.     Motor: No abnormal muscle tone.     Coordination: Coordination normal.     Comments: Normal sensation to light touch to feet and all toes b/l 5/5 strength b/l with dorsiflexion and plantarflexion  Psychiatric:        Behavior: Behavior normal.           Assessment & Plan:      ICD-10-CM   1. Left foot  pain M79.672 DG Foot Complete Left  2. Confusion R41.0 Urinalysis, Routine w reflex microscopic    Urine Culture   r/o UTI, otherwise chronic cognitive decline, f/up pcp  3. Recurrent UTI N39.0     Left dorsal foot pain mild swelling unclear etiology, skin and toenail health appears excellent she is good sensation good strength, no known injury but will obtain x-ray to rule out any osseous abnormality.  Suspect any vascular issues and it does not seem consistent with  any type of peripheral neuropathy secondary to diabetes, there is also no other associated edema or joint pain or swelling  The daughter is here with the patient she started to discuss her confusion but then also did state that has not changed in several months only very minor changes it did improve a little bit after treatment for UTI 3 weeks ago.  Sounds like her blood sugars are improving overall, will recheck her urine today but otherwise diabetes management and her cognitive function is currently being managed by her PCP and have recommended her to continue to follow with her PCP  Pt and daughter left to get xrays, will call her with UA results and urine culture added.  Last microscopy was reviewed was about 3 weeks ago was for Klebsiella pneumonia patient was treated with Keflex  Delsa Grana, PA-C 10/14/18 11:42 AM

## 2018-10-15 LAB — URINE CULTURE
MICRO NUMBER:: 496249
SPECIMEN QUALITY:: ADEQUATE

## 2018-10-20 ENCOUNTER — Other Ambulatory Visit: Payer: Self-pay

## 2018-10-20 ENCOUNTER — Other Ambulatory Visit: Payer: Medicare Other

## 2018-10-20 DIAGNOSIS — C541 Malignant neoplasm of endometrium: Secondary | ICD-10-CM

## 2018-10-27 ENCOUNTER — Other Ambulatory Visit: Payer: Self-pay | Admitting: Family Medicine

## 2018-10-27 MED ORDER — SITAGLIPTIN PHOSPHATE 25 MG PO TABS: 25.0000 mg | ORAL_TABLET | Freq: Every day | ORAL | 0 refills | Status: DC

## 2018-11-02 ENCOUNTER — Other Ambulatory Visit: Payer: Self-pay | Admitting: *Deleted

## 2018-11-02 ENCOUNTER — Telehealth: Payer: Self-pay | Admitting: *Deleted

## 2018-11-02 DIAGNOSIS — N3281 Overactive bladder: Secondary | ICD-10-CM

## 2018-11-02 MED ORDER — TRIMETHOPRIM 100 MG PO TABS: 100.0000 mg | ORAL_TABLET | Freq: Every day | ORAL | 1 refills | Status: AC

## 2018-11-02 MED ORDER — MIRABEGRON ER 50 MG PO TB24: 50.0000 mg | ORAL_TABLET | Freq: Every day | ORAL | 3 refills | Status: AC

## 2018-11-02 NOTE — Telephone Encounter (Signed)
Call placed to patient and patient daughter Christine Zavala made aware.

## 2018-11-02 NOTE — Telephone Encounter (Signed)
These look good, no changes F/u in office in 2 months for repeat labs

## 2018-11-02 NOTE — Telephone Encounter (Signed)
Received call from patient daughter [name] [phone] telephone.   Reports that patient FSBS is ranging 140- 150 in AM fasting.   Patient continues Glimepiride 2mg  daily, and Januvia 25mg  daily.

## 2018-11-03 ENCOUNTER — Other Ambulatory Visit: Payer: Self-pay | Admitting: Family Medicine

## 2018-11-24 ENCOUNTER — Telehealth: Payer: Medicare Other | Admitting: Nurse Practitioner

## 2018-11-24 DIAGNOSIS — N3000 Acute cystitis without hematuria: Secondary | ICD-10-CM | POA: Diagnosis not present

## 2018-11-24 MED ORDER — CEPHALEXIN 500 MG PO CAPS: 500.0000 mg | ORAL_CAPSULE | Freq: Two times a day (BID) | ORAL | 0 refills | Status: DC

## 2018-11-24 NOTE — Progress Notes (Signed)

## 2018-11-30 ENCOUNTER — Ambulatory Visit: Payer: Medicare Other | Admitting: Family Medicine

## 2018-12-01 DIAGNOSIS — D225 Melanocytic nevi of trunk: Secondary | ICD-10-CM | POA: Diagnosis not present

## 2018-12-01 DIAGNOSIS — D485 Neoplasm of uncertain behavior of skin: Secondary | ICD-10-CM | POA: Diagnosis not present

## 2018-12-01 DIAGNOSIS — L821 Other seborrheic keratosis: Secondary | ICD-10-CM | POA: Diagnosis not present

## 2018-12-01 DIAGNOSIS — R233 Spontaneous ecchymoses: Secondary | ICD-10-CM | POA: Diagnosis not present

## 2018-12-13 ENCOUNTER — Other Ambulatory Visit: Payer: Self-pay | Admitting: Family Medicine

## 2018-12-16 ENCOUNTER — Other Ambulatory Visit: Payer: Self-pay | Admitting: Family Medicine

## 2018-12-17 DIAGNOSIS — N950 Postmenopausal bleeding: Secondary | ICD-10-CM | POA: Diagnosis not present

## 2018-12-22 ENCOUNTER — Telehealth: Payer: Self-pay | Admitting: *Deleted

## 2018-12-22 NOTE — Telephone Encounter (Signed)
Talked with the daughter and she can come to the appt

## 2018-12-22 NOTE — Telephone Encounter (Signed)
Called and spoke with the patient's daughter regarding a new appt. Appt scheduled for 8/10. Gave the daughter the appt date/time and instructions for the appt.

## 2019-01-03 ENCOUNTER — Ambulatory Visit: Payer: Medicare Other | Admitting: Gynecologic Oncology

## 2019-01-03 ENCOUNTER — Telehealth: Payer: Self-pay | Admitting: *Deleted

## 2019-01-03 NOTE — Telephone Encounter (Signed)
Patient's daughter called and rescheduled the appt for today. Patient is sick

## 2019-01-24 ENCOUNTER — Encounter: Payer: Self-pay | Admitting: Gynecologic Oncology

## 2019-01-24 ENCOUNTER — Inpatient Hospital Stay: Payer: Medicare Other | Attending: Gynecologic Oncology | Admitting: Gynecologic Oncology

## 2019-01-24 ENCOUNTER — Other Ambulatory Visit: Payer: Self-pay

## 2019-01-24 VITALS — BP 120/58 | HR 72 | Temp 98.3°F | Resp 18 | Ht 63.0 in | Wt 260.5 lb

## 2019-01-24 DIAGNOSIS — F0150 Vascular dementia without behavioral disturbance: Secondary | ICD-10-CM | POA: Insufficient documentation

## 2019-01-24 DIAGNOSIS — I10 Essential (primary) hypertension: Secondary | ICD-10-CM | POA: Diagnosis not present

## 2019-01-24 DIAGNOSIS — Z79899 Other long term (current) drug therapy: Secondary | ICD-10-CM | POA: Diagnosis not present

## 2019-01-24 DIAGNOSIS — N939 Abnormal uterine and vaginal bleeding, unspecified: Secondary | ICD-10-CM | POA: Insufficient documentation

## 2019-01-24 DIAGNOSIS — E119 Type 2 diabetes mellitus without complications: Secondary | ICD-10-CM | POA: Diagnosis not present

## 2019-01-24 DIAGNOSIS — Z6841 Body Mass Index (BMI) 40.0 and over, adult: Secondary | ICD-10-CM | POA: Diagnosis not present

## 2019-01-24 DIAGNOSIS — E785 Hyperlipidemia, unspecified: Secondary | ICD-10-CM | POA: Insufficient documentation

## 2019-01-24 DIAGNOSIS — I69319 Unspecified symptoms and signs involving cognitive functions following cerebral infarction: Secondary | ICD-10-CM | POA: Diagnosis not present

## 2019-01-24 DIAGNOSIS — Z85038 Personal history of other malignant neoplasm of large intestine: Secondary | ICD-10-CM | POA: Diagnosis not present

## 2019-01-24 DIAGNOSIS — I679 Cerebrovascular disease, unspecified: Secondary | ICD-10-CM | POA: Insufficient documentation

## 2019-01-24 DIAGNOSIS — E559 Vitamin D deficiency, unspecified: Secondary | ICD-10-CM | POA: Insufficient documentation

## 2019-01-24 DIAGNOSIS — Z8673 Personal history of transient ischemic attack (TIA), and cerebral infarction without residual deficits: Secondary | ICD-10-CM | POA: Diagnosis not present

## 2019-01-24 DIAGNOSIS — Z86718 Personal history of other venous thrombosis and embolism: Secondary | ICD-10-CM | POA: Insufficient documentation

## 2019-01-24 DIAGNOSIS — M810 Age-related osteoporosis without current pathological fracture: Secondary | ICD-10-CM | POA: Insufficient documentation

## 2019-01-24 DIAGNOSIS — E7800 Pure hypercholesterolemia, unspecified: Secondary | ICD-10-CM | POA: Diagnosis not present

## 2019-01-24 DIAGNOSIS — Z7901 Long term (current) use of anticoagulants: Secondary | ICD-10-CM | POA: Diagnosis not present

## 2019-01-24 DIAGNOSIS — E1122 Type 2 diabetes mellitus with diabetic chronic kidney disease: Secondary | ICD-10-CM

## 2019-01-24 DIAGNOSIS — C541 Malignant neoplasm of endometrium: Secondary | ICD-10-CM | POA: Diagnosis not present

## 2019-01-24 NOTE — Patient Instructions (Addendum)
Dr Denman George is recommending either:  1/ attempted hysterectomy with a laparoscopy to see how badly the abdominal adhesions are and if it is safe to proceed with robotic/laparoscopic hysterectomy we would proceed with removing the uterus, cervix and tubes and ovaries. There would be a 1 night hospital stay. If the scar tissue was too bad to be able to visualize the uterus, we would abort the procedure, and instead perform a scraping of the uterine cavity and placement of an IUD which releases a medical therapy for the cancer.  2/ proceed with D&C and IUD placement (scraping procedure with the medical treatment). This is an outpatient procedure (same day discharge).   Dr Serita Grit office can be reached at [phone].  Preparing for your Surgery  Plan for surgery with Dr. Everitt Amber at [facility] will be scheduled for a dilation and curettage of the uterus with IUD placement.   We will let you know what the copay is for the IUD.  Pre-operative Testing -You will receive a phone call from presurgical testing at [facility] if you have not received a call already to arrange for a pre-operative testing appointment before your surgery.  This appointment normally occurs one to two weeks before your scheduled surgery.   -Bring your insurance card, copy of an advanced directive if applicable, medication list   -We will contact you about your plavix.  -Do not take supplements such as fish oil (omega 3), red yeast rice, tumeric before your surgery.  Day Before Surgery at [facility] will be advised to have nothing to eat or drink after midnight the evening before or a different time.    Your role in recovery Your role is to become active as soon as directed by your doctor, while still giving yourself time to heal.  Rest when you feel tired. You will be asked to do the following in order to speed your recovery:  - Cough and breathe deeply. This helps toclear and expand your lungs  and can prevent pneumonia. You may be given a spirometer to practice deep breathing. A staff member will show you how to use the spirometer. - Do mild physical activity. Walking or moving your legs help your circulation and body functions return to normal. A staff member will help you when you try to walk and will provide you with simple exercises. Do not try to get up or walk alone the first time. - Actively manage your pain. Managing your pain lets you move in comfort. We will ask you to rate your pain on a scale of zero to 10. It is your responsibility to tell your doctor or nurse where and how much you hurt so your pain can be treated.  Special Considerations -If you are diabetic, you may be placed on insulin after surgery to have closer control over your blood sugars to promote healing and recovery.  This does not mean that you will be discharged on insulin.  If applicable, your oral antidiabetics will be resumed when you are tolerating a solid diet.  -Your final pathology results from surgery should be available around one week after surgery and the results will be relayed to you when available.  -Dr. Lahoma Crocker is the surgeon that assists your GYN Oncologist with surgery.  If you end up staying the night, the next day after your surgery you will either see Dr. Denman George or Dr. Lahoma Crocker.  -FMLA forms can be faxed to [phone] and please  allow 5-7 business days for completion.   Intrauterine Device Insertion An intrauterine device (IUD) is a medical device that gets inserted into the uterus to prevent pregnancy. It is a small, T-shaped device that has one or two nylon strings hanging down from it. The strings hang out of the lower part of the uterus (cervix) to allow for future IUD removal. There are two types of IUDs available:  Copper IUD. This type of IUD has copper wire wrapped around it. Copper makes the uterus and fallopian tubes produce a fluid that kills sperm. A copper  IUD may last up to 10 years.  Hormone IUD. This type of IUD is made of plastic and contains the hormone progestin (synthetic progesterone). The hormone thickens mucus in the cervix and prevents sperm from entering the uterus. It also thins the uterine lining to prevent implantation of a fertilized egg. The hormone can weaken or kill the sperm that get into the uterus. A hormone IUD may last 3-5 years. Tell a health care provider about:  Any allergies you have.  All medicines you are taking, including vitamins, herbs, eye drops, creams, and over-the-counter medicines.  Any problems you or family members have had with anesthetic medicines.  Any blood disorders you have.  Any surgeries you have had.  Any medical conditions you have, including any STIs (sexually transmitted infections) you may have.  Whether you are pregnant or may be pregnant. What are the risks? Generally, this is a safe procedure. However, problems may occur, including:  Infection.  Bleeding.  Allergic reactions to medicines.  Accidental puncture (perforation) of the uterus, or damage to other structures or organs.  Accidental placement of the IUD either in the muscle layer of the uterus (myometrium) or outside the uterus.  The IUD falling out of the uterus (expulsion). This is more common among women who have recently had a child.  Pregnancy that happens in the fallopian tube (ectopic pregnancy).  Infection of the uterus and fallopian tubes (pelvic inflammatory disease). What happens before the procedure?  Schedule the IUD insertion for when you will have your menstrual period or right after, to make sure you are not pregnant. Placement of the IUD is better tolerated shortly after a menstrual cycle.  Follow instructions from your health care provider about eating or drinking restrictions.  Ask your health care provider about changing or stopping your regular medicines. This is especially important if you are  taking diabetes medicines or blood thinners.  You may get a pain reliever to take before the procedure.  You may have tests for:  Pregnancy. A pregnancy test involves having a urine sample taken.  STIs. Placing an IUD in someone who has an STI can make the infection worse.  Cervical cancer. You may have a Pap test to check for this type of cancer. This means collecting cells from your cervix to be examined under a microscope.  You may have a physical exam to determine the size and position of your uterus. The procedure may vary among health care providers and hospitals. What happens during the procedure?  A tool (speculum) will be placed in your vagina and widened so that your health care provider can see your cervix.  Medicine may be applied to your cervix to help lower your risk of infection (antiseptic medicine).  You may be given an anesthetic medicine to numb each side of your cervix (intracervical block or paracervical block). This medicine is usually given by an injection into the cervix.  A tool (uterine sound) will be inserted into your uterus to determine the length of your uterus and the direction that your uterus may be tilted.  A slim instrument or tube (IUD inserter) that holds the IUD will be inserted into your vagina, through your cervical canal, and into your uterus.  The IUD will be placed in the uterus, and the IUD inserter will be removed.  The strings that are attached to the IUD will be trimmed so that they lie just below the cervix. The procedure may vary among health care providers and hospitals. What happens after the procedure?  You may have bleeding after the procedure. This is normal. It varies from light bleeding (spotting) for a few days to menstrual-like bleeding.  You may have cramping and pain.  You may feel dizzy or light-headed.  You may have lower back pain. Summary  An intrauterine device (IUD) is a small, T-shaped device that has one or  two nylon strings hanging down from it.  Two types of IUDs are available. You may have a copper IUD or a hormone IUD.  Schedule the IUD insertion for when you will have your menstrual period or right after, to make sure you are not pregnant. Placement of the IUD is better tolerated shortly after a menstrual cycle.  You may have bleeding after the procedure. This is normal. It varies from light spotting for a few days to menstrual-like bleeding. This information is not intended to replace advice given to you by your health care provider. Make sure you discuss any questions you have with your health care provider. Document Released: 01/08/2011 Document Revised: 04/02/2016 Document Reviewed: 04/02/2016 Elsevier Interactive Patient Education  2017 [employer].  Levonorgestrel intrauterine device (IUD) What is this medicine? LEVONORGESTREL IUD (LEE voe nor jes trel) is a contraceptive (birth control) device. The device is placed inside the uterus by a healthcare professional. It is used to prevent pregnancy. This device can also be used to treat heavy bleeding that occurs during your period. This medicine may be used for other purposes; ask your health care provider or pharmacist if you have questions. COMMON BRAND NAME(S): Minette Headland What should I tell my health care provider before I take this medicine? They need to know if you have any of these conditions: -abnormal Pap smear -cancer of the breast, uterus, or cervix -diabetes -endometritis -genital or pelvic infection now or in the past -have more than one sexual partner or your partner has more than one partner -heart disease -history of an ectopic or tubal pregnancy -immune system problems -IUD in place -liver disease or tumor -problems with blood clots or take blood-thinners -seizures -use intravenous drugs -uterus of unusual shape -vaginal bleeding that has not been explained -an unusual or allergic reaction  to levonorgestrel, other hormones, silicone, or polyethylene, medicines, foods, dyes, or preservatives -pregnant or trying to get pregnant -breast-feeding How should I use this medicine? This device is placed inside the uterus by a health care professional. Talk to your pediatrician regarding the use of this medicine in children. Special care may be needed. Overdosage: If you think you have taken too much of this medicine contact a poison control center or emergency room at once. NOTE: This medicine is only for you. Do not share this medicine with others. What if I miss a dose? This does not apply. Depending on the brand of device you have inserted, the device will need to be replaced every 3 to 5 years if  you wish to continue using this type of birth control. What may interact with this medicine? Do not take this medicine with any of the following medications: -amprenavir -bosentan -fosamprenavir This medicine may also interact with the following medications: -aprepitant -armodafinil -barbiturate medicines for inducing sleep or treating seizures -bexarotene -boceprevir -griseofulvin -medicines to treat seizures like carbamazepine, ethotoin, felbamate, oxcarbazepine, phenytoin, topiramate -modafinil -pioglitazone -rifabutin -rifampin -rifapentine -some medicines to treat HIV infection like atazanavir, efavirenz, indinavir, lopinavir, nelfinavir, tipranavir, ritonavir -St. John's wort -warfarin This list may not describe all possible interactions. Give your health care provider a list of all the medicines, herbs, non-prescription drugs, or dietary supplements you use. Also tell them if you smoke, drink alcohol, or use illegal drugs. Some items may interact with your medicine. What should I watch for while using this medicine? Visit your doctor or health care professional for regular check ups. See your doctor if you or your partner has sexual contact with others, becomes HIV positive,  or gets a sexual transmitted disease. This product does not protect you against HIV infection (AIDS) or other sexually transmitted diseases. You can check the placement of the IUD yourself by reaching up to the top of your vagina with clean fingers to feel the threads. Do not pull on the threads. It is a good habit to check placement after each menstrual period. Call your doctor right away if you feel more of the IUD than just the threads or if you cannot feel the threads at all. The IUD may come out by itself. You may become pregnant if the device comes out. If you notice that the IUD has come out use a backup birth control method like condoms and call your health care provider. Using tampons will not change the position of the IUD and are okay to use during your period. This IUD can be safely scanned with magnetic resonance imaging (MRI) only under specific conditions. Before you have an MRI, tell your healthcare provider that you have an IUD in place, and which type of IUD you have in place. What side effects may I notice from receiving this medicine? Side effects that you should report to your doctor or health care professional as soon as possible: -allergic reactions like skin rash, itching or hives, swelling of the face, lips, or tongue -fever, flu-like symptoms -genital sores -high blood pressure -no menstrual period for 6 weeks during use -pain, swelling, warmth in the leg -pelvic pain or tenderness -severe or sudden headache -signs of pregnancy -stomach cramping -sudden shortness of breath -trouble with balance, talking, or walking -unusual vaginal bleeding, discharge -yellowing of the eyes or skin Side effects that usually do not require medical attention (report to your doctor or health care professional if they continue or are bothersome): -acne -breast pain -change in sex drive or performance -changes in weight -cramping, dizziness, or faintness while the device is being  inserted -headache -irregular menstrual bleeding within first 3 to 6 months of use -nausea This list may not describe all possible side effects. Call your doctor for medical advice about side effects. You may report side effects to FDA at 1-800-FDA-1088. Where should I keep my medicine? This does not apply. NOTE: This sheet is a summary. It may not cover all possible information. If you have questions about this medicine, talk to your doctor, pharmacist, or health care provider.  2018 Elsevier/Gold Standard (2016-02-22 14:14:56)

## 2019-01-24 NOTE — Progress Notes (Signed)
Consult Note: Gyn-Onc  Consult was requested by Dr. Lynnette Caffey for the evaluation of Christine Zavala 85 y.o. female  CC:  Chief Complaint  Patient presents with  . Endometrial adenocarcinoma [facility])    Assessment/Plan:  Christine Zavala  is a 85 y.o.  year old with grade 2 endometrioid endometrial cancer. She has significant medical comorbidities including multi-infarct dementia, morbid obesity with a BMI of 46 kg per metered squared, cerebrovascular disease, diabetes mellitus, a complex abdominal surgical history including prior colon resection, takedown colostomy, and hernia repair.  Given her poor performance status at baseline (ECOG 2-3), advanced age (age 39) and multiple medical comorbidities, and high risk for surgical complication, I have concerns about the morbidity of a hysterectomy in this patient.  I discussed this with the patient and her daughter.  I explained that surgery could result in bowel injury, laparotomy, and worsening quality of life.  I explained that an alternative to hysterectomy might be D&C and IUD placement.  The patient and her daughter are interested in a less radical approach given her underlying health.  They have agreed to a D&C Mirena IUD placement.  We will perform this in the operating room.  She will not need to hold her Plavix prior to this procedure.  Given her history of dual primaries of endometrial and colon cancer I am recommending follow-up with genetics for consultation for genetic testing as she may be a carrier of Lynch syndrome.  HPI: Christine Zavala is a 85 year old P2 who is seen in consultation at the request of Dr. Linda Hedges for a grade 2 endometrial cancer.  The patient reports a one-month history of bleeding which began in July 2020.  Her family immediately sought evaluation with Dr. Lynnette Caffey who performed a transvaginal ultrasound scan on December 17, 2018.  This revealed a uterus measuring 8.1 x 4.5 x 5.1 cm.  The endometrial thickness was increased at  3.2 cm.  The left ovary was not visualized, she had had a history of a prior right salpingo-oophorectomy.  In follow-up to the ultrasound findings she had an endometrial Pipelle biopsy performed on the same day which revealed a FIGO grade 2 endometrioid adenocarcinoma.  The patient's medical history is significant for colon cancer diagnosed in [place] in 2003.  This was treated with surgery which included a left colectomy (low anterior resection) with temporary colostomy.  Was then followed by chemotherapy and radiation.  The patient and her daughter are unable to recount the staging of her cancer.,  Followed by reversal of the colostomy.  Preceding that she had a prior abdominal hernia repair with mesh.  A cholecystectomy was performed at the time of 3 for colon cancer surgeries.  She had remained free of disease since that time.  Medical history is also significant for morbid obesity, with a BMI 46 kg/m.  She has diabetes mellitus for which she takes metformin.  And other oral therapies.  She had a DVT after 1 of her colon cancer surgeries in 2003 and was treated temporarily with oral anticoagulant therapy.  She has had multiple TIAs and has known carotid artery disease and takes Plavix.  She has a history of dementia which is progressive, is felt to be from her mini strokes.  Her family history significant with a mother who died from pancreatic cancer.  Her past surgical history includes as stated above.  She has a history of a low anterior resection with temporary colostomy bag formation in 2003, reversal of colostomy and reanastomosis  of colon, hernia repair with mesh, cholecystectomy, right salpingo-oophorectomy.  The patient lives with her daughter and son and grandchildren.  She has had 2 prior vaginal births.   Current Meds:  Outpatient Encounter Medications as of 01/24/2019  Medication Sig  . acetaminophen (TYLENOL) 500 MG tablet Take 1,000 mg by mouth as needed for mild pain.  .  Blood Glucose Monitoring Suppl (BLOOD GLUCOSE SYSTEM PAK) KIT Please dispense based on patient and insurance preference. Use as directed to monitor FSBS 2x daily. Dx: E11.9.  [name] clopidogrel (PLAVIX) 75 MG tablet Take 1 tablet (75 mg total) by mouth daily.  . furosemide (LASIX) 40 MG tablet Take 1 tablet (40 mg total) by mouth daily as needed.  [name] glimepiride (AMARYL) 1 MG tablet TAKE 2 TABLETS (2 MG TOTAL) BY MOUTH DAILY WITH BREAKFAST.  [name] Lancets MISC Please dispense based on patient and insurance preference. Use as directed to monitor FSBS 2x daily. Dx: E11.9.  . loratadine (CLARITIN) 10 MG tablet Take 1 tablet (10 mg total) by mouth daily.  . memantine (NAMENDA XR) 7 MG CP24 24 hr capsule TAKE 1 CAPSULE BY MOUTH EVERYDAY AT BEDTIME  . mirabegron ER (MYRBETRIQ) 50 MG TB24 tablet Take 1 tablet (50 mg total) by mouth daily.  Glory Rosebush ULTRA test strip TEST AS DIRECTED 2 X DAILY  . trimethoprim (TRIMPEX) 100 MG tablet Take 1 tablet (100 mg total) by mouth daily. Prophylaxis  . [DISCONTINUED] pravastatin (PRAVACHOL) 80 MG tablet Take 1 tablet (80 mg total) by mouth daily.  . sitaGLIPtin (JANUVIA) 25 MG tablet Take 1 tablet (25 mg total) by mouth daily. (Patient not taking: Reported on 01/24/2019)  . [DISCONTINUED] cephALEXin (KEFLEX) 500 MG capsule Take 1 capsule (500 mg total) by mouth 2 (two) times daily. (Patient not taking: Reported on 01/24/2019)   No facility-administered encounter medications on file as of 01/24/2019.     Allergy:  Allergies  Allergen Reactions  . Aspirin Nausea And Vomiting  . Calcium-Containing Compounds     Legs burn  . Codeine Sulfate Nausea And Vomiting  . Lipitor [Atorvastatin]     myalgias  . Zocor [Simvastatin]     pruritis  . Zyrtec [Cetirizine]     Social Hx:   Social History   Socioeconomic History  . Marital status: Widowed    Spouse name: Not on file  . Number of children: 2  . Years of education: 22  . Highest education level: Not on file   Occupational History  . Occupation: retired    Fish farm manager: RETIRED  Social Needs  . Financial resource strain: Not on file  . Food insecurity    Worry: Not on file    Inability: Not on file  . Transportation needs    Medical: Not on file    Non-medical: Not on file  Tobacco Use  . Smoking status: Former Research scientist (life sciences)  . Smokeless tobacco: Never Used  . Tobacco comment: quit 1970's  Substance and Sexual Activity  . Alcohol use: No  . Drug use: No  . Sexual activity: Not on file  Lifestyle  . Physical activity    Days per week: Not on file    Minutes per session: Not on file  . Stress: Not on file  Relationships  . Social Herbalist on phone: Not on file    Gets together: Not on file    Attends religious service: Not on file    Active member of club or organization: Not on file  Attends meetings of clubs or organizations: Not on file    Relationship status: Not on file  . Intimate partner violence    Fear of current or ex partner: Not on file    Emotionally abused: Not on file    Physically abused: Not on file    Forced sexual activity: Not on file  Other Topics Concern  . Not on file  Social History Narrative   Patient lives at home with her Son Sheliah Plane). Patient is a widowed. Patient is retired. Right handed.   Caffeine - one cup daily    Past Surgical Hx:  Past Surgical History:  Procedure Laterality Date  . CATARACT EXTRACTION, BILATERAL     2001  . COLON SURGERY     2004  . GALLBLADDER SURGERY     2001  . HERNIA REPAIR     2004  . ovary removed     2002    Past Medical Hx:  Past Medical History:  Diagnosis Date  . Cancer (HCC)    Rectal  . Cataract   . Dementia ([place])   . Diabetes mellitus without complication ([place])   . DVT (deep venous thrombosis) ([place])   . H/O vitamin D deficiency   . High cholesterol   . Hyperlipidemia   . Hypertension   . Osteoporosis   . Stroke [facility])     Past Gynecological History:  See HPI.  No LMP  recorded. Patient is postmenopausal.  Family Hx:  Family History  Problem Relation Age of Onset  . Cancer Mother        Pancreatic  . COPD Mother   . Dementia Father     Review of Systems:  Constitutional  Feels well,    ENT Normal appearing ears and nares bilaterally Skin/Breast  No rash, sores, jaundice, itching, dryness Cardiovascular  No chest pain, shortness of breath, or edema  Pulmonary  No cough or wheeze.  Gastro Intestinal  No nausea, vomitting, or diarrhoea. No bright red blood per rectum, no abdominal pain, change in bowel movement, or constipation.  Genito Urinary  No frequency, urgency, dysuria, + bleeding Musculo Skeletal  No myalgia, arthralgia, joint swelling or pain  Neurologic  No weakness, numbness, change in gait,  Psychology  No depression, anxiety, insomnia.   Vitals:  Blood pressure (!) 120/58, pulse 72, temperature 98.3 F (36.8 C), temperature source Temporal, resp. rate 18, height '5\' 3"'  (1.6 m), weight 260 lb 8 oz (118.2 kg), SpO2 99 %.  Physical Exam: WD in NAD Neck  Supple NROM, without any enlargements.  Lymph Node Survey No cervical supraclavicular or inguinal adenopathy Cardiovascular  Pulse normal rate, regularity and rhythm. S1 and S2 normal.  Lungs  Clear to auscultation bilateraly, without wheezes/crackles/rhonchi. Good air movement.  Skin  No rash/lesions/breakdown  Psychiatry  Alert and oriented to person, place, and time  Abdomen  Normoactive bowel sounds, abdomen soft, non-tender and obese with possible evidence of hernia (midline diastasis in upper abdomen in midline).  Back No CVA tenderness Genito Urinary  Vulva/vagina: Normal external female genitalia.  No lesions. No discharge or bleeding.  Bladder/urethra:  No lesions or masses, well supported bladder  Vagina: normal  Cervix: Normal appearing, no lesions.Flush with vaginal fornix  Uterus: Small, mobile, no parametrial involvement or nodularity.  Adnexa: no  palpable masses. Rectal  deferred Extremities  No bilateral cyanosis, clubbing or edema.   Thereasa Solo, MD  01/24/2019, 5:03 PM

## 2019-01-24 NOTE — H&P (View-Only) (Signed)
Consult Note: Gyn-Onc  Consult was requested by Dr. Lynnette Caffey for the evaluation of Christine Zavala 85 y.o. female  CC:  Chief Complaint  Patient presents with  . Endometrial adenocarcinoma [facility])    Assessment/Plan:  Christine. Christine Zavala  is a 85 y.o.  year old with grade 2 endometrioid endometrial cancer. She has significant medical comorbidities including multi-infarct dementia, morbid obesity with a BMI of 46 kg per metered squared, cerebrovascular disease, diabetes mellitus, a complex abdominal surgical history including prior colon resection, takedown colostomy, and hernia repair.  Given her poor performance status at baseline (ECOG 2-3), advanced age (age 41) and multiple medical comorbidities, and high risk for surgical complication, I have concerns about the morbidity of a hysterectomy in this patient.  I discussed this with the patient and her daughter.  I explained that surgery could result in bowel injury, laparotomy, and worsening quality of life.  I explained that an alternative to hysterectomy might be D&C and IUD placement.  The patient and her daughter are interested in a less radical approach given her underlying health.  They have agreed to a D&C Mirena IUD placement.  We will perform this in the operating room.  She will not need to hold her Plavix prior to this procedure.  Given her history of dual primaries of endometrial and colon cancer I am recommending follow-up with genetics for consultation for genetic testing as she may be a carrier of Lynch syndrome.  HPI: Christine Zavala is a 85 year old P2 who is seen in consultation at the request of Dr. Linda Hedges for a grade 2 endometrial cancer.  The patient reports a one-month history of bleeding which began in July 2020.  Her family immediately sought evaluation with Dr. Lynnette Caffey who performed a transvaginal ultrasound scan on December 17, 2018.  This revealed a uterus measuring 8.1 x 4.5 x 5.1 cm.  The endometrial thickness was increased at  3.2 cm.  The left ovary was not visualized, she had had a history of a prior right salpingo-oophorectomy.  In follow-up to the ultrasound findings she had an endometrial Pipelle biopsy performed on the same day which revealed a FIGO grade 2 endometrioid adenocarcinoma.  The patient's medical history is significant for colon cancer diagnosed in [place] in 2003.  This was treated with surgery which included a left colectomy (low anterior resection) with temporary colostomy.  Was then followed by chemotherapy and radiation.  The patient and her daughter are unable to recount the staging of her cancer.,  Followed by reversal of the colostomy.  Preceding that she had a prior abdominal hernia repair with mesh.  A cholecystectomy was performed at the time of 3 for colon cancer surgeries.  She had remained free of disease since that time.  Medical history is also significant for morbid obesity, with a BMI 46 kg/m.  She has diabetes mellitus for which she takes metformin.  And other oral therapies.  She had a DVT after 1 of her colon cancer surgeries in 2003 and was treated temporarily with oral anticoagulant therapy.  She has had multiple TIAs and has known carotid artery disease and takes Plavix.  She has a history of dementia which is progressive, is felt to be from her mini strokes.  Her family history significant with a mother who died from pancreatic cancer.  Her past surgical history includes as stated above.  She has a history of a low anterior resection with temporary colostomy bag formation in 2003, reversal of colostomy and reanastomosis  of colon, hernia repair with mesh, cholecystectomy, right salpingo-oophorectomy.  The patient lives with her daughter and son and grandchildren.  She has had 2 prior vaginal births.   Current Meds:  Outpatient Encounter Medications as of 01/24/2019  Medication Sig  . acetaminophen (TYLENOL) 500 MG tablet Take 1,000 mg by mouth as needed for mild pain.  .  Blood Glucose Monitoring Suppl (BLOOD GLUCOSE SYSTEM PAK) KIT Please dispense based on patient and insurance preference. Use as directed to monitor FSBS 2x daily. Dx: E11.9.  [name] clopidogrel (PLAVIX) 75 MG tablet Take 1 tablet (75 mg total) by mouth daily.  . furosemide (LASIX) 40 MG tablet Take 1 tablet (40 mg total) by mouth daily as needed.  [name] glimepiride (AMARYL) 1 MG tablet TAKE 2 TABLETS (2 MG TOTAL) BY MOUTH DAILY WITH BREAKFAST.  [name] Lancets MISC Please dispense based on patient and insurance preference. Use as directed to monitor FSBS 2x daily. Dx: E11.9.  . loratadine (CLARITIN) 10 MG tablet Take 1 tablet (10 mg total) by mouth daily.  . memantine (NAMENDA XR) 7 MG CP24 24 hr capsule TAKE 1 CAPSULE BY MOUTH EVERYDAY AT BEDTIME  . mirabegron ER (MYRBETRIQ) 50 MG TB24 tablet Take 1 tablet (50 mg total) by mouth daily.  Glory Rosebush ULTRA test strip TEST AS DIRECTED 2 X DAILY  . trimethoprim (TRIMPEX) 100 MG tablet Take 1 tablet (100 mg total) by mouth daily. Prophylaxis  . [DISCONTINUED] pravastatin (PRAVACHOL) 80 MG tablet Take 1 tablet (80 mg total) by mouth daily.  . sitaGLIPtin (JANUVIA) 25 MG tablet Take 1 tablet (25 mg total) by mouth daily. (Patient not taking: Reported on 01/24/2019)  . [DISCONTINUED] cephALEXin (KEFLEX) 500 MG capsule Take 1 capsule (500 mg total) by mouth 2 (two) times daily. (Patient not taking: Reported on 01/24/2019)   No facility-administered encounter medications on file as of 01/24/2019.     Allergy:  Allergies  Allergen Reactions  . Aspirin Nausea And Vomiting  . Calcium-Containing Compounds     Legs burn  . Codeine Sulfate Nausea And Vomiting  . Lipitor [Atorvastatin]     myalgias  . Zocor [Simvastatin]     pruritis  . Zyrtec [Cetirizine]     Social Hx:   Social History   Socioeconomic History  . Marital status: Widowed    Spouse name: Not on file  . Number of children: 2  . Years of education: 96  . Highest education level: Not on file   Occupational History  . Occupation: retired    Fish farm manager: RETIRED  Social Needs  . Financial resource strain: Not on file  . Food insecurity    Worry: Not on file    Inability: Not on file  . Transportation needs    Medical: Not on file    Non-medical: Not on file  Tobacco Use  . Smoking status: Former Research scientist (life sciences)  . Smokeless tobacco: Never Used  . Tobacco comment: quit 1970's  Substance and Sexual Activity  . Alcohol use: No  . Drug use: No  . Sexual activity: Not on file  Lifestyle  . Physical activity    Days per week: Not on file    Minutes per session: Not on file  . Stress: Not on file  Relationships  . Social Herbalist on phone: Not on file    Gets together: Not on file    Attends religious service: Not on file    Active member of club or organization: Not on file  Attends meetings of clubs or organizations: Not on file    Relationship status: Not on file  . Intimate partner violence    Fear of current or ex partner: Not on file    Emotionally abused: Not on file    Physically abused: Not on file    Forced sexual activity: Not on file  Other Topics Concern  . Not on file  Social History Narrative   Patient lives at home with her Son Sheliah Plane). Patient is a widowed. Patient is retired. Right handed.   Caffeine - one cup daily    Past Surgical Hx:  Past Surgical History:  Procedure Laterality Date  . CATARACT EXTRACTION, BILATERAL     2001  . COLON SURGERY     2004  . GALLBLADDER SURGERY     2001  . HERNIA REPAIR     2004  . ovary removed     2002    Past Medical Hx:  Past Medical History:  Diagnosis Date  . Cancer (HCC)    Rectal  . Cataract   . Dementia ([place])   . Diabetes mellitus without complication ([place])   . DVT (deep venous thrombosis) ([place])   . H/O vitamin D deficiency   . High cholesterol   . Hyperlipidemia   . Hypertension   . Osteoporosis   . Stroke [facility])     Past Gynecological History:  See HPI.  No LMP  recorded. Patient is postmenopausal.  Family Hx:  Family History  Problem Relation Age of Onset  . Cancer Mother        Pancreatic  . COPD Mother   . Dementia Father     Review of Systems:  Constitutional  Feels well,    ENT Normal appearing ears and nares bilaterally Skin/Breast  No rash, sores, jaundice, itching, dryness Cardiovascular  No chest pain, shortness of breath, or edema  Pulmonary  No cough or wheeze.  Gastro Intestinal  No nausea, vomitting, or diarrhoea. No bright red blood per rectum, no abdominal pain, change in bowel movement, or constipation.  Genito Urinary  No frequency, urgency, dysuria, + bleeding Musculo Skeletal  No myalgia, arthralgia, joint swelling or pain  Neurologic  No weakness, numbness, change in gait,  Psychology  No depression, anxiety, insomnia.   Vitals:  Blood pressure (!) 120/58, pulse 72, temperature 98.3 F (36.8 C), temperature source Temporal, resp. rate 18, height '5\' 3"'  (1.6 m), weight 260 lb 8 oz (118.2 kg), SpO2 99 %.  Physical Exam: WD in NAD Neck  Supple NROM, without any enlargements.  Lymph Node Survey No cervical supraclavicular or inguinal adenopathy Cardiovascular  Pulse normal rate, regularity and rhythm. S1 and S2 normal.  Lungs  Clear to auscultation bilateraly, without wheezes/crackles/rhonchi. Good air movement.  Skin  No rash/lesions/breakdown  Psychiatry  Alert and oriented to person, place, and time  Abdomen  Normoactive bowel sounds, abdomen soft, non-tender and obese with possible evidence of hernia (midline diastasis in upper abdomen in midline).  Back No CVA tenderness Genito Urinary  Vulva/vagina: Normal external female genitalia.  No lesions. No discharge or bleeding.  Bladder/urethra:  No lesions or masses, well supported bladder  Vagina: normal  Cervix: Normal appearing, no lesions.Flush with vaginal fornix  Uterus: Small, mobile, no parametrial involvement or nodularity.  Adnexa: no  palpable masses. Rectal  deferred Extremities  No bilateral cyanosis, clubbing or edema.   Thereasa Solo, MD  01/24/2019, 5:03 PM

## 2019-01-26 ENCOUNTER — Telehealth: Payer: Self-pay | Admitting: Family Medicine

## 2019-01-26 NOTE — Telephone Encounter (Signed)
Patients daughter calling to see if new handicap placard can be filled out for her [phone]

## 2019-01-28 ENCOUNTER — Other Ambulatory Visit: Payer: Self-pay | Admitting: Gynecologic Oncology

## 2019-01-28 ENCOUNTER — Telehealth: Payer: Self-pay | Admitting: *Deleted

## 2019-01-28 ENCOUNTER — Telehealth: Payer: Self-pay

## 2019-01-28 DIAGNOSIS — C541 Malignant neoplasm of endometrium: Secondary | ICD-10-CM

## 2019-01-28 MED ORDER — LEVONORGESTREL 20 MCG/24HR IU IUD: 1.0000 | INTRAUTERINE_SYSTEM | Freq: Once | INTRAUTERINE | 0 refills | Status: AC

## 2019-01-28 MED FILL — MIRENA SYSTEM: 20 | 90 days supply | Qty: 1 | Fill #0

## 2019-01-28 NOTE — Telephone Encounter (Signed)
Received a call from [employer] that the IUD was approved and a letter was being sent out

## 2019-01-28 NOTE — Telephone Encounter (Signed)
I spoke with [name], Ms [facility] daughter.  I told her the copay for the IUD was $8.95 paid to [facility] outpatient pharmacy.  I gave her Sandi's phone number [phone] to facilitate payment.  I told her that the surgery for her mother has been scheduled for 02/22/2019 and That preadmission testing dept will contact them for an appointment.  [name] verbalized understanding.

## 2019-02-01 NOTE — Telephone Encounter (Signed)
Form completed and sent to PCP for signature.

## 2019-02-02 NOTE — Telephone Encounter (Signed)
Call placed to patient and patient daughter Christine Zavala made aware.   Form mailed to patient.

## 2019-02-10 ENCOUNTER — Telehealth: Payer: Self-pay

## 2019-02-10 NOTE — Telephone Encounter (Signed)
[name] daughter is agreeable to r/s surgery to 02-17-19. Told her that the pre op appointment may change from 02-16-19.  Pre op may be calling her in the next few days to reschedule. Daughter [name] verbalized understanding.

## 2019-02-14 ENCOUNTER — Other Ambulatory Visit (HOSPITAL_COMMUNITY)
Admission: RE | Admit: 2019-02-14 | Discharge: 2019-02-14 | Disposition: A | Discharge status: Home / Self Care | Payer: Medicare Other | Source: Ambulatory Visit | Attending: Gynecologic Oncology | Admitting: Gynecologic Oncology

## 2019-02-14 DIAGNOSIS — Z01812 Encounter for preprocedural laboratory examination: Secondary | ICD-10-CM | POA: Insufficient documentation

## 2019-02-14 DIAGNOSIS — Z20828 Contact with and (suspected) exposure to other viral communicable diseases: Secondary | ICD-10-CM | POA: Diagnosis not present

## 2019-02-15 NOTE — Patient Instructions (Addendum)
DUE TO COVID-19 ONLY ONE VISITOR IS ALLOWED TO COME WITH YOU AND STAY IN THE WAITING ROOM ONLY DURING PRE OP AND PROCEDURE DAY OF SURGERY. THE 1 VISITOR MAY VISIT WITH YOU AFTER SURGERY IN YOUR PRIVATE ROOM DURING VISITING HOURS ONLY!  YOU NEED TO HAVE A COVID 19 TEST ON 02-14-19.  PLEASE CONTINUE THE QUARANTINE INSTRUCTIONS AS OUTLINED IN YOUR HANDOUT.                Christine Zavala  02/15/2019   Your procedure is scheduled on: 02-17-19   Report to [facility] Main  Entrance    Report to Admitting at 8:45 AM     Call this number if you have problems the morning of surgery [phone]    Remember: Do not eat food or drink liquids :After Midnight.      Take these medicines the morning of surgery with A SIP OF WATER: None   BRUSH YOUR TEETH MORNING OF SURGERY AND RINSE YOUR MOUTH OUT, NO CHEWING GUM CANDY OR MINTS.  DO NOT TAKE ANY DIABETIC MEDICATIONS DAY OF YOUR SURGERY                               You may not have any metal on your body including hair pins and              piercings     Do not wear jewelry, make-up, lotions, powders or perfumes, deodorant              Do not wear nail polish.  Do not shave  48 hours prior to surgery.     Do not bring valuables to the hospital. [place].  Contacts, dentures or bridgework may not be worn into surgery.      Patients discharged the day of surgery will not be allowed to drive home. IF YOU ARE HAVING SURGERY AND GOING HOME THE SAME DAY, YOU MUST HAVE AN ADULT TO DRIVE YOU HOME AND BE WITH YOU FOR 24 HOURS. YOU MAY GO HOME BY TAXI OR UBER OR ORTHERWISE, BUT AN ADULT MUST ACCOMPANY YOU HOME AND STAY WITH YOU FOR 24 HOURS.  Name and phone number of your driver: Caleb Popp  Special Instructions: N/A              Please read over the following fact sheets you were given: _____________________________________________________________________  How to Manage Your Diabetes Before  and After Surgery  Why is it important to control my blood sugar before and after surgery? . Improving blood sugar levels before and after surgery helps healing and can limit problems. . A way of improving blood sugar control is eating a healthy diet by: o  Eating less sugar and carbohydrates o  Increasing activity/exercise o  Talking with your doctor about reaching your blood sugar goals . High blood sugars (greater than 180 mg/dL) can raise your risk of infections and slow your recovery, so you will need to focus on controlling your diabetes during the weeks before surgery. . Make sure that the doctor who takes care of your diabetes knows about your planned surgery including the date and location.  How do I manage my blood sugar before surgery? . Check your blood sugar at least 4 times a day, starting 2 days before surgery, to make sure that  the level is not too high or low. o Check your blood sugar the morning of your surgery when you wake up and every 2 hours until you get to the Short Stay unit. . If your blood sugar is less than 70 mg/dL, you will need to treat for low blood sugar: o Do not take insulin. o Treat a low blood sugar (less than 70 mg/dL) with  cup of clear juice (cranberry or apple), 4 glucose tablets, OR glucose gel. o Recheck blood sugar in 15 minutes after treatment (to make sure it is greater than 70 mg/dL). If your blood sugar is not greater than 70 mg/dL on recheck, call [phone] for further instructions. . Report your blood sugar to the short stay nurse when you get to Short Stay.  . If you are admitted to the hospital after surgery: o Your blood sugar will be checked by the staff and you will probably be given insulin after surgery (instead of oral diabetes medicines) to make sure you have good blood sugar levels. o The goal for blood sugar control after surgery is 80-180 mg/dL.   WHAT DO I DO ABOUT MY DIABETES MEDICATION?  [name] Do not take oral diabetes  medicines (pills) the morning of surgery.  THE DAY BEFORE SURGERY, take your usual dose of Januvia. However, only take your morning dose of Glimepiride (Amaryl)     Reviewed and Endorsed by [facility] Patient Education Committee, August 2015           [facility] - Preparing for Surgery Before surgery, you can play an important role.  Because skin is not sterile, your skin needs to be as free of germs as possible.  You can reduce the number of germs on your skin by washing with CHG (chlorahexidine gluconate) soap before surgery.  CHG is an antiseptic cleaner which kills germs and bonds with the skin to continue killing germs even after washing. Please DO NOT use if you have an allergy to CHG or antibacterial soaps.  If your skin becomes reddened/irritated stop using the CHG and inform your nurse when you arrive at Short Stay. Do not shave (including legs and underarms) for at least 48 hours prior to the first CHG shower.  You may shave your face/neck. Please follow these instructions carefully:  1.  Shower with CHG Soap the night before surgery and the  morning of Surgery.  2.  If you choose to wash your hair, wash your hair first as usual with your  normal  shampoo.  3.  After you shampoo, rinse your hair and body thoroughly to remove the  shampoo.                           4.  Use CHG as you would any other liquid soap.  You can apply chg directly  to the skin and wash                       Gently with a scrungie or clean washcloth.  5.  Apply the CHG Soap to your body ONLY FROM THE NECK DOWN.   Do not use on face/ open                           Wound or open sores. Avoid contact with eyes, ears mouth and genitals (private parts).  Wash face,  Genitals (private parts) with your normal soap.             6.  Wash thoroughly, paying special attention to the area where your surgery  will be performed.  7.  Thoroughly rinse your body with warm water from the neck down.  8.  DO  NOT shower/wash with your normal soap after using and rinsing off  the CHG Soap.                9.  Pat yourself dry with a clean towel.            10.  Wear clean pajamas.            11.  Place clean sheets on your bed the night of your first shower and do not  sleep with pets. Day of Surgery : Do not apply any lotions/deodorants the morning of surgery.  Please wear clean clothes to the hospital/surgery center.  FAILURE TO FOLLOW THESE INSTRUCTIONS MAY RESULT IN THE CANCELLATION OF YOUR SURGERY PATIENT SIGNATURE_________________________________  NURSE SIGNATURE__________________________________  ________________________________________________________________________

## 2019-02-16 ENCOUNTER — Encounter: Payer: Self-pay | Admitting: *Deleted

## 2019-02-16 ENCOUNTER — Other Ambulatory Visit: Payer: Self-pay

## 2019-02-16 ENCOUNTER — Encounter (HOSPITAL_COMMUNITY)
Admission: RE | Admit: 2019-02-16 | Discharge: 2019-02-16 | Disposition: A | Discharge status: Home / Self Care | Payer: Medicare Other | Source: Ambulatory Visit | Attending: Gynecologic Oncology | Admitting: Gynecologic Oncology

## 2019-02-16 ENCOUNTER — Telehealth: Payer: Self-pay | Admitting: *Deleted

## 2019-02-16 ENCOUNTER — Encounter (HOSPITAL_COMMUNITY): Payer: Self-pay

## 2019-02-16 DIAGNOSIS — Z01818 Encounter for other preprocedural examination: Secondary | ICD-10-CM | POA: Diagnosis not present

## 2019-02-16 DIAGNOSIS — C541 Malignant neoplasm of endometrium: Secondary | ICD-10-CM | POA: Insufficient documentation

## 2019-02-16 LAB — COMPREHENSIVE METABOLIC PANEL
ALT: 15 U/L (ref 0–44)
AST: 14 U/L — ABNORMAL LOW (ref 15–41)
Albumin: 3.5 g/dL (ref 3.5–5.0)
Alkaline Phosphatase: 96 U/L (ref 38–126)
Anion gap: 8 (ref 5–15)
BUN: 21 mg/dL (ref 8–23)
CO2: 28 mmol/L (ref 22–32)
Calcium: 8.5 mg/dL — ABNORMAL LOW (ref 8.9–10.3)
Chloride: 104 mmol/L (ref 98–111)
Creatinine, Ser: 1.17 mg/dL — ABNORMAL HIGH (ref 0.44–1.00)
GFR calc Af Amer: 50 mL/min — ABNORMAL LOW (ref >60)
GFR calc non Af Amer: 43 mL/min — ABNORMAL LOW (ref >60)
Glucose, Bld: 172 mg/dL — ABNORMAL HIGH (ref 70–99)
Potassium: 3.9 mmol/L (ref 3.5–5.1)
Sodium: 140 mmol/L (ref 135–145)
Total Bilirubin: 1.1 mg/dL (ref 0.3–1.2)
Total Protein: 6.8 g/dL (ref 6.5–8.1)

## 2019-02-16 LAB — CBC
HCT: 43.4 % (ref 36.0–46.0)
Hemoglobin: 12.9 g/dL (ref 12.0–15.0)
MCH: 29.2 pg (ref 26.0–34.0)
MCHC: 29.7 g/dL — ABNORMAL LOW (ref 30.0–36.0)
MCV: 98.2 fL (ref 80.0–100.0)
Platelets: 215 10*3/uL (ref 150–400)
RBC: 4.42 MIL/uL (ref 3.87–5.11)
RDW: 13.8 % (ref 11.5–15.5)
WBC: 9.9 10*3/uL (ref 4.0–10.5)
nRBC: 0 % (ref 0.0–0.2)

## 2019-02-16 LAB — URINALYSIS, ROUTINE W REFLEX MICROSCOPIC
Bilirubin Urine: NEGATIVE
Glucose, UA: NEGATIVE mg/dL
Hgb urine dipstick: NEGATIVE
Ketones, ur: NEGATIVE mg/dL
Nitrite: NEGATIVE
Protein, ur: NEGATIVE mg/dL
Specific Gravity, Urine: 1.02 (ref 1.005–1.030)
pH: 6 (ref 5.0–8.0)

## 2019-02-16 LAB — GLUCOSE, CAPILLARY: Glucose-Capillary: 170 mg/dL — ABNORMAL HIGH (ref 70–99)

## 2019-02-16 LAB — NOVEL CORONAVIRUS, NAA (HOSP ORDER, SEND-OUT TO REF LAB; TAT 18-24 HRS): SARS-CoV-2, NAA: NOT DETECTED

## 2019-02-16 NOTE — Telephone Encounter (Signed)
Fax office note to referring office  

## 2019-02-16 NOTE — Progress Notes (Signed)
Per Janett Billow Zanetto's discussion with Joylene John, PA, pt for okay to continue taking Plavix. Urine sample can be also be obtained the day of surgery, if pt is not able to provide at PAT appt.

## 2019-02-17 ENCOUNTER — Encounter (HOSPITAL_COMMUNITY)
Admission: RE | Disposition: A | Discharge status: Home / Self Care | Payer: Self-pay | Source: Home / Self Care | Attending: Gynecologic Oncology

## 2019-02-17 ENCOUNTER — Ambulatory Visit (HOSPITAL_COMMUNITY): Payer: Medicare Other

## 2019-02-17 ENCOUNTER — Encounter (HOSPITAL_COMMUNITY): Payer: Self-pay

## 2019-02-17 ENCOUNTER — Ambulatory Visit (HOSPITAL_COMMUNITY)
Admission: RE | Admit: 2019-02-17 | Discharge: 2019-02-17 | Disposition: A | Discharge status: Home / Self Care | Payer: Medicare Other | Attending: Gynecologic Oncology | Admitting: Gynecologic Oncology

## 2019-02-17 ENCOUNTER — Other Ambulatory Visit: Payer: Self-pay

## 2019-02-17 DIAGNOSIS — I10 Essential (primary) hypertension: Secondary | ICD-10-CM | POA: Insufficient documentation

## 2019-02-17 DIAGNOSIS — Z9221 Personal history of antineoplastic chemotherapy: Secondary | ICD-10-CM | POA: Diagnosis not present

## 2019-02-17 DIAGNOSIS — F0150 Vascular dementia without behavioral disturbance: Secondary | ICD-10-CM | POA: Diagnosis not present

## 2019-02-17 DIAGNOSIS — E119 Type 2 diabetes mellitus without complications: Secondary | ICD-10-CM | POA: Diagnosis not present

## 2019-02-17 DIAGNOSIS — C541 Malignant neoplasm of endometrium: Secondary | ICD-10-CM | POA: Diagnosis not present

## 2019-02-17 DIAGNOSIS — Z8673 Personal history of transient ischemic attack (TIA), and cerebral infarction without residual deficits: Secondary | ICD-10-CM | POA: Diagnosis not present

## 2019-02-17 DIAGNOSIS — Z7902 Long term (current) use of antithrombotics/antiplatelets: Secondary | ICD-10-CM | POA: Diagnosis not present

## 2019-02-17 DIAGNOSIS — Z923 Personal history of irradiation: Secondary | ICD-10-CM | POA: Diagnosis not present

## 2019-02-17 DIAGNOSIS — Z7984 Long term (current) use of oral hypoglycemic drugs: Secondary | ICD-10-CM | POA: Insufficient documentation

## 2019-02-17 DIAGNOSIS — Z79899 Other long term (current) drug therapy: Secondary | ICD-10-CM | POA: Insufficient documentation

## 2019-02-17 DIAGNOSIS — Z30430 Encounter for insertion of intrauterine contraceptive device: Secondary | ICD-10-CM | POA: Diagnosis not present

## 2019-02-17 DIAGNOSIS — Z800 Family history of malignant neoplasm of digestive organs: Secondary | ICD-10-CM | POA: Diagnosis not present

## 2019-02-17 DIAGNOSIS — Z85038 Personal history of other malignant neoplasm of large intestine: Secondary | ICD-10-CM | POA: Diagnosis not present

## 2019-02-17 DIAGNOSIS — Z86718 Personal history of other venous thrombosis and embolism: Secondary | ICD-10-CM | POA: Diagnosis not present

## 2019-02-17 DIAGNOSIS — Z87891 Personal history of nicotine dependence: Secondary | ICD-10-CM | POA: Diagnosis not present

## 2019-02-17 DIAGNOSIS — Z6841 Body Mass Index (BMI) 40.0 and over, adult: Secondary | ICD-10-CM | POA: Diagnosis not present

## 2019-02-17 DIAGNOSIS — E785 Hyperlipidemia, unspecified: Secondary | ICD-10-CM | POA: Diagnosis not present

## 2019-02-17 HISTORY — PX: INTRAUTERINE DEVICE (IUD) INSERTION: SHX5877

## 2019-02-17 HISTORY — PX: DILATION AND CURETTAGE OF UTERUS: SHX78

## 2019-02-17 LAB — GLUCOSE, CAPILLARY
Glucose-Capillary: 174 mg/dL — ABNORMAL HIGH (ref 70–99)
Glucose-Capillary: 161 mg/dL — ABNORMAL HIGH (ref 70–99)

## 2019-02-17 LAB — HEMOGLOBIN A1C
Hgb A1c MFr Bld: 8.2 % — ABNORMAL HIGH (ref 4.8–5.6)
Mean Plasma Glucose: 189 mg/dL

## 2019-02-17 SURGERY — DILATION AND CURETTAGE
Anesthesia: General

## 2019-02-17 MED ORDER — BUPIVACAINE-EPINEPHRINE 0.5% -1:200000 IJ SOLN
INTRAMUSCULAR | Status: AC
  Filled 2019-02-17: qty 1

## 2019-02-17 MED ORDER — FENTANYL CITRATE (PF) 100 MCG/2ML IJ SOLN
INTRAMUSCULAR | Status: DC | PRN
  Administered 2019-02-17 (×2): 25 ug via INTRAVENOUS

## 2019-02-17 MED ORDER — LEVONORGESTREL 19.5 MCG/DAY IU IUD
INTRAUTERINE_SYSTEM | INTRAUTERINE | Status: DC | PRN
  Administered 2019-02-17: 52 mg via INTRAUTERINE

## 2019-02-17 MED ORDER — ONDANSETRON HCL 4 MG/2ML IJ SOLN
INTRAMUSCULAR | Status: DC | PRN
  Administered 2019-02-17: 4 mg via INTRAVENOUS

## 2019-02-17 MED ORDER — LIDOCAINE 2% (20 MG/ML) 5 ML SYRINGE
INTRAMUSCULAR | Status: DC | PRN
  Administered 2019-02-17: 60 mg via INTRAVENOUS

## 2019-02-17 MED ORDER — FENTANYL CITRATE (PF) 100 MCG/2ML IJ SOLN: 25.0000 ug | INTRAMUSCULAR | Status: DC | PRN

## 2019-02-17 MED ORDER — PROPOFOL 10 MG/ML IV BOLUS
INTRAVENOUS | Status: DC | PRN
  Administered 2019-02-17: 200 mg via INTRAVENOUS

## 2019-02-17 MED ORDER — PROPOFOL 10 MG/ML IV BOLUS
INTRAVENOUS | Status: AC
  Filled 2019-02-17: qty 20

## 2019-02-17 MED ORDER — FENTANYL CITRATE (PF) 100 MCG/2ML IJ SOLN
INTRAMUSCULAR | Status: AC
  Filled 2019-02-17: qty 2

## 2019-02-17 MED ORDER — BUPIVACAINE-EPINEPHRINE 0.5% -1:200000 IJ SOLN
INTRAMUSCULAR | Status: DC | PRN
  Administered 2019-02-17: 10 mL

## 2019-02-17 MED ORDER — ONDANSETRON HCL 4 MG/2ML IJ SOLN: 4.0000 mg | Freq: Once | INTRAMUSCULAR | Status: DC | PRN

## 2019-02-17 MED ORDER — 0.9 % SODIUM CHLORIDE (POUR BTL) OPTIME
TOPICAL | Status: DC | PRN
  Administered 2019-02-17: 1000 mL

## 2019-02-17 MED ORDER — LACTATED RINGERS IV SOLN
INTRAVENOUS | Status: DC | PRN
  Administered 2019-02-17: 09:00:00 via INTRAVENOUS

## 2019-02-17 MED ORDER — ONDANSETRON HCL 4 MG/2ML IJ SOLN
INTRAMUSCULAR | Status: AC
  Filled 2019-02-17: qty 2

## 2019-02-17 MED ORDER — LIDOCAINE 2% (20 MG/ML) 5 ML SYRINGE
INTRAMUSCULAR | Status: AC
  Filled 2019-02-17: qty 5

## 2019-02-17 MED ORDER — ACETAMINOPHEN 500 MG PO TABS
ORAL_TABLET | ORAL | Status: AC
  Administered 2019-02-17: 09:00:00 1000 mg via ORAL
  Filled 2019-02-17: qty 2

## 2019-02-17 MED ORDER — ACETAMINOPHEN 500 MG PO TABS
1000.0000 mg | ORAL_TABLET | Freq: Once | ORAL | Status: AC
  Administered 2019-02-17: 09:00:00 1000 mg via ORAL

## 2019-02-17 SURGICAL SUPPLY — 23 items
CATH ROBINSON RED A/P 16FR (CATHETERS) ×2 IMPLANT
COVER WAND RF STERILE (DRAPES) IMPLANT
DRAPE SHEET LG 3/4 BI-LAMINATE (DRAPES) ×2 IMPLANT
DRAPE UNDERBUTTOCKS STRL (DISPOSABLE) ×2 IMPLANT
DRSG TELFA 3X8 NADH (GAUZE/BANDAGES/DRESSINGS) ×2 IMPLANT
GAUZE 4X4 16PLY RFD (DISPOSABLE) ×2 IMPLANT
GLOVE BIO SURGEON STRL SZ 6 (GLOVE) ×4 IMPLANT
GOWN STRL REUS W/ TWL LRG LVL3 (GOWN DISPOSABLE) ×2 IMPLANT
GOWN STRL REUS W/TWL LRG LVL3 (GOWN DISPOSABLE) ×4
KIT BASIN OR (CUSTOM PROCEDURE TRAY) ×2 IMPLANT
KIT TURNOVER KIT A (KITS) IMPLANT
Mirena IUD ×2 IMPLANT
NEEDLE SPNL 22GX3.5 QUINCKE BK (NEEDLE) ×2 IMPLANT
NS IRRIG 1000ML POUR BTL (IV SOLUTION) ×2 IMPLANT
PACK LITHOTOMY IV (CUSTOM PROCEDURE TRAY) ×2 IMPLANT
PAD OB MATERNITY 4.3X12.25 (PERSONAL CARE ITEMS) ×2 IMPLANT
SURGILUBE 2OZ TUBE FLIPTOP (MISCELLANEOUS) ×2 IMPLANT
SUT VIC AB 0 CT1 27 (SUTURE) ×2
SUT VIC AB 0 CT1 27XBRD ANTBC (SUTURE) ×2 IMPLANT
SYR BULB IRRIGATION 50ML (SYRINGE) ×2 IMPLANT
TOWEL OR 17X26 10 PK STRL BLUE (TOWEL DISPOSABLE) ×2 IMPLANT
UNDERPAD 30X36 HEAVY ABSORB (UNDERPADS AND DIAPERS) ×4 IMPLANT
YANKAUER SUCT BULB TIP 10FT TU (MISCELLANEOUS) ×2 IMPLANT

## 2019-02-17 NOTE — Transfer of Care (Signed)
Immediate Anesthesia Transfer of Care Note  Patient: Christine Zavala  Procedure(s) Performed: DILATATION AND CURETTAGE (N/A ) INTRAUTERINE DEVICE (IUD) INSERTION- MIRENA (N/A )  Patient Location: PACU  Anesthesia Type:General  Level of Consciousness: awake, alert  and oriented  Airway & Oxygen Therapy: Patient Spontanous Breathing and Patient connected to face mask oxygen  Post-op Assessment: Report given to RN and Post -op Vital signs reviewed and stable  Post vital signs: Reviewed and stable  Last Vitals:  Vitals Value Taken Time  BP 146/58 02/17/19 1200  Temp    Pulse 76 02/17/19 1203  Resp 16 02/17/19 1203  SpO2 100 % 02/17/19 1203  Vitals shown include unvalidated device data.  Last Pain:  Vitals:   02/17/19 0943  TempSrc: Oral         Complications: No apparent anesthesia complications

## 2019-02-17 NOTE — Discharge Instructions (Signed)
D&C, Care After ACTIVITY  Rest as much as possible the first two days after discharge.  You do not have weight restrictions on lifting  Avoid strenuous working out such as running or lifting weights for 24 hours  You can climb stairs and drive a car NUTRITION  You may resume your normal diet.  Drink 6 to 8 glasses of fluids a day.  Eat a healthy, balanced diet including portions of food from the meat (protein), milk, fruit, vegetable, and bread groups.  Your caregiver may recommend you take a multivitamin with iron.  ELIMINATION   If constipation occurs, drink more liquids, and add more fruits, vegetables, and bran to your diet. You may take a mild laxative, such as Milk of Magnesia, Metamucil, or a stool softener such as Colace, with permission from your caregiver.  HYGIENE  You may shower and wash your hair.  Avoid tub baths for 4 weeks  Do not add any bath oils or chemicals to your bath water, after you have permission to take baths.  Avoid placing anything in the vagina for 4 weeks.  It is normal to pass a brown-flecked discharge from the vagina for several weeks after your procedure.  HOME CARE INSTRUCTIONS   Take your temperature twice a day and record it, especially if you feel feverish or have chills.  Follow your caregiver's instructions about medicines, activity, and follow-up appointments after surgery.  Do not drink alcohol while taking pain medicine.  You may take over-the-counter medicine for pain, recommended by your caregiver.  If your pain is not relieved with medicine, call your caregiver.  Do not take aspirin because it can cause bleeding.  Do not douche or use tampons (use a nonperfumed sanitary pad).  Do not have sexual intercourse for 6 weeks postoperatively. Hugging, kissing, and playful sexual activity is fine with your caregiver's permission.  Take showers instead of baths, until your caregiver gives you permission to take baths.  You  may take a mild medicine for constipation, recommended by your caregiver. Bran foods and drinking a lot of fluids will help with constipation.  Make sure your family understands everything about your operation and recovery.  SEEK MEDICAL CARE IF:    You notice a foul smell coming from the vagina.  You have painful or bloody urination.  You develop nausea and vomiting.  You develop diarrhea.  You develop a rash.  You have a reaction or allergy from the medicine.  You feel dizzy or light-headed.  You need stronger pain medicine.   SEEK IMMEDIATE MEDICAL CARE IF:   You develop a temperature of 102 F (38.9 C) or higher.  You pass out.  You develop leg or chest pain.  You develop abdominal pain.  You develop shortness of breath.  You bleed heavier than a period (soaking through 2 or more pads per hour for 2 hours in a row).  You see pus in the wound area.  MAKE SURE YOU:   Understand these instructions.  Will watch your condition.  Will get help right away if you are not doing well or get worse. Document Released: 12/25/2003 Document Revised: 09/26/2013 Document Reviewed: 04/13/2009 [facility] Patient Information 2015 [place], [place]. This information is not intended to replace advice given to you by your health care provider. Make sure you discuss any questions you have with your health care provider.

## 2019-02-17 NOTE — Anesthesia Postprocedure Evaluation (Signed)
Anesthesia Post Note  Patient: Christine Zavala  Procedure(s) Performed: DILATATION AND CURETTAGE (N/A ) INTRAUTERINE DEVICE (IUD) INSERTION- MIRENA (N/A )     Patient location during evaluation: PACU Anesthesia Type: General Level of consciousness: awake Pain management: pain level controlled Vital Signs Assessment: post-procedure vital signs reviewed and stable Respiratory status: spontaneous breathing, nonlabored ventilation, respiratory function stable and patient connected to nasal cannula oxygen Cardiovascular status: blood pressure returned to baseline and stable Postop Assessment: no apparent nausea or vomiting Anesthetic complications: no    Last Vitals:  Vitals:   02/17/19 1230 02/17/19 1316  BP: (!) 144/69 (!) 145/91  Pulse: 68 69  Resp: 18 18  Temp: 36.7 C 36.6 C  SpO2: 97% 96%    Last Pain:  Vitals:   02/17/19 1316  TempSrc: Oral  PainSc: 0-No pain                 Envi Eagleson P Bernedette Auston

## 2019-02-17 NOTE — Anesthesia Preprocedure Evaluation (Addendum)
Anesthesia Evaluation  Patient identified by MRN, date of birth, ID band Patient awake    Reviewed: Allergy & Precautions, NPO status , Patient's Chart, lab work & pertinent test results  Airway Mallampati: II  TM Distance: >3 FB Neck ROM: Full    Dental  (+) Edentulous Upper, Missing   Pulmonary former smoker,    Pulmonary exam normal breath sounds clear to auscultation       Cardiovascular hypertension, + DVT  Normal cardiovascular exam Rhythm:Regular Rate:Normal  ECG: rate 84. Normal sinus rhythm Left ventricular hypertrophy with QRS widening   Neuro/Psych PSYCHIATRIC DISORDERS Dementia    GI/Hepatic negative GI ROS, Neg liver ROS,   Endo/Other  diabetes, Oral Hypoglycemic AgentsMorbid obesity  Renal/GU negative Renal ROS     Musculoskeletal negative musculoskeletal ROS (+)   Abdominal (+) + obese,   Peds  Hematology HLD   Anesthesia Other Findings ENDOMETRIAL CANCER  Reproductive/Obstetrics                            Anesthesia Physical Anesthesia Plan  ASA: III  Anesthesia Plan: General   Post-op Pain Management:    Induction: Intravenous  PONV Risk Score and Plan: 3 and Dexamethasone, Ondansetron and Treatment may vary due to age or medical condition  Airway Management Planned: LMA  Additional Equipment:   Intra-op Plan:   Post-operative Plan: Extubation in OR  Informed Consent: I have reviewed the patients History and Physical, chart, labs and discussed the procedure including the risks, benefits and alternatives for the proposed anesthesia with the patient or authorized representative who has indicated his/her understanding and acceptance.     Dental advisory given  Plan Discussed with: CRNA  Anesthesia Plan Comments:       Anesthesia Quick Evaluation

## 2019-02-17 NOTE — Op Note (Signed)
OPERATIVE NOTE  PATIENT: Bennetta Laos DATE: 02/17/19  Preop Diagnosis: grade 2 edometrial cancer, extreme morbid obesity, dementia.  Postoperative Diagnosis: same  Surgery: D&C (dilation and curettage), placement of IUD (progestin releasing)  Surgeons:  Donaciano Eva, MD Assistant: none  Anesthesia: General   Estimated blood loss: <10 ml  IVF:  21ml   Urine output: 20 ml   Complications: None   Pathology: endometrial curettings  Operative findings: small cervix flush with vagina. No gross lesions. Uterus sounded to 8cm. Large amount of tissue removed from uterus.   Procedure: The patient was identified in the preoperative holding area. Informed consent was signed on the chart. Patient was seen history was reviewed and exam was performed.   The patient was then taken to the operating room and placed in the supine position with SCD hose on. General anesthesia was then induced without difficulty. She was then placed in the dorsolithotomy position. The perineum was prepped with Betadine. The vagina was prepped with Betadine. The patient was then draped after the prep was dried. A red rubber to empty the bladder was performed under aseptic conditions.  Timeout was performed the patient, procedure, antibiotic, allergy, and length of procedure.   The weighted speculum was placed in the posterior vagina. The single tooth tenaculum was placed on the anterior lip of the cervix. The uterine sound was placed in the cervix and advanced to the fundus. The cervix was successively dilated using pratts dilators to 33. A sharp curette was advanced to the fundus and a comprehensive curette of the endometrial cavity took place until a gritty feel was appreciated. The specimen was collected on a telfa and sent for permanent pathology.  The Mirena IUD was deployed to the fundus to 8cm.  Strings were cut to 3-4inches.  The tenaculum was removed and hemostasis was observed.   The  vagina was irrigated.  All instrument, suture, laparotomy, Ray-Tec, and needle counts were correct x2. The patient tolerated the procedure well and was taken recovery room in stable condition. This is Everitt Amber dictating an operative

## 2019-02-17 NOTE — Interval H&P Note (Signed)
History and Physical Interval Note:  02/17/2019 10:58 AM  Christine Zavala  has presented today for surgery, with the diagnosis of ENDOMETRIAL CANCER.  The various methods of treatment have been discussed with the patient and family. After consideration of risks, benefits and other options for treatment, the patient has consented to  Procedure(s): DILATATION AND CURETTAGE (N/A) INTRAUTERINE DEVICE (IUD) INSERTION- MIRENA (N/A) as a surgical intervention.  The patient's history has been reviewed, patient examined, no change in status, stable for surgery.  I have reviewed the patient's chart and labs.  Questions were answered to the patient's satisfaction.     Thereasa Solo

## 2019-02-17 NOTE — Anesthesia Procedure Notes (Signed)
Procedure Name: LMA Insertion Date/Time: 02/17/2019 11:24 AM Performed by: Maxwell Caul, CRNA Pre-anesthesia Checklist: Patient identified, Emergency Drugs available, Suction available, Patient being monitored and Timeout performed Patient Re-evaluated:Patient Re-evaluated prior to induction Oxygen Delivery Method: Circle system utilized Preoxygenation: Pre-oxygenation with 100% oxygen Induction Type: IV induction LMA: LMA inserted and LMA with gastric port inserted LMA Size: 4.0 Number of attempts: 2 Placement Confirmation: positive ETCO2 and breath sounds checked- equal and bilateral Tube secured with: Tape Dental Injury: Bloody posterior oropharynx  Comments: LMA #5 placed with poor seating, removed and small amount of blood noted in oropharynx. LMA#4 placed without complicaitons.

## 2019-02-18 ENCOUNTER — Encounter (HOSPITAL_COMMUNITY): Payer: Self-pay | Admitting: Gynecologic Oncology

## 2019-02-18 ENCOUNTER — Telehealth: Payer: Self-pay

## 2019-02-18 NOTE — Telephone Encounter (Signed)
Christine Zavala states that she is doing very well. She is eating,drinking, and urinating well. No abdominal pain. Pt states her throat is sore. Told her that was from the tube in her throat to help her breath during the surgery.  She could gargle with warm salt water.  The throat discomfort should improve over the next couple of days. Daughter robin aware of post op appointment on 03-22-19 with Dr. Denman George.

## 2019-03-14 ENCOUNTER — Other Ambulatory Visit: Payer: Self-pay | Admitting: Family Medicine

## 2019-03-22 ENCOUNTER — Encounter: Payer: Self-pay | Admitting: Gynecologic Oncology

## 2019-03-22 ENCOUNTER — Other Ambulatory Visit: Payer: Self-pay

## 2019-03-22 ENCOUNTER — Inpatient Hospital Stay: Payer: Medicare Other | Attending: Gynecologic Oncology | Admitting: Gynecologic Oncology

## 2019-03-22 VITALS — BP 107/53 | HR 73 | Temp 98.5°F | Resp 20 | Ht 63.0 in | Wt 260.5 lb

## 2019-03-22 DIAGNOSIS — Z9049 Acquired absence of other specified parts of digestive tract: Secondary | ICD-10-CM | POA: Diagnosis not present

## 2019-03-22 DIAGNOSIS — Z8673 Personal history of transient ischemic attack (TIA), and cerebral infarction without residual deficits: Secondary | ICD-10-CM | POA: Insufficient documentation

## 2019-03-22 DIAGNOSIS — Z79899 Other long term (current) drug therapy: Secondary | ICD-10-CM | POA: Insufficient documentation

## 2019-03-22 DIAGNOSIS — C541 Malignant neoplasm of endometrium: Secondary | ICD-10-CM | POA: Diagnosis not present

## 2019-03-22 DIAGNOSIS — E7800 Pure hypercholesterolemia, unspecified: Secondary | ICD-10-CM | POA: Diagnosis not present

## 2019-03-22 DIAGNOSIS — Z86718 Personal history of other venous thrombosis and embolism: Secondary | ICD-10-CM | POA: Insufficient documentation

## 2019-03-22 DIAGNOSIS — E785 Hyperlipidemia, unspecified: Secondary | ICD-10-CM | POA: Insufficient documentation

## 2019-03-22 DIAGNOSIS — E1136 Type 2 diabetes mellitus with diabetic cataract: Secondary | ICD-10-CM | POA: Insufficient documentation

## 2019-03-22 DIAGNOSIS — M810 Age-related osteoporosis without current pathological fracture: Secondary | ICD-10-CM | POA: Insufficient documentation

## 2019-03-22 DIAGNOSIS — Z6841 Body Mass Index (BMI) 40.0 and over, adult: Secondary | ICD-10-CM | POA: Diagnosis not present

## 2019-03-22 DIAGNOSIS — Z7984 Long term (current) use of oral hypoglycemic drugs: Secondary | ICD-10-CM | POA: Insufficient documentation

## 2019-03-22 DIAGNOSIS — Z7902 Long term (current) use of antithrombotics/antiplatelets: Secondary | ICD-10-CM | POA: Diagnosis not present

## 2019-03-22 DIAGNOSIS — E559 Vitamin D deficiency, unspecified: Secondary | ICD-10-CM | POA: Insufficient documentation

## 2019-03-22 DIAGNOSIS — I10 Essential (primary) hypertension: Secondary | ICD-10-CM | POA: Diagnosis not present

## 2019-03-22 DIAGNOSIS — F0150 Vascular dementia without behavioral disturbance: Secondary | ICD-10-CM | POA: Insufficient documentation

## 2019-03-22 NOTE — Progress Notes (Signed)
Consult Note: Gyn-Onc  Consult was requested by Dr. Lynnette Caffey for the evaluation of Christine Zavala 85 y.o. female  CC:  Chief Complaint  Patient presents with  . Endometrial cancer ([place])  . Post-op Follow-up    Assessment/Plan:  Ms. Christine Zavala  is a 85 y.o.  year old with grade 2 endometrioid endometrial cancer, non-operative candidate due to significant medical comorbidities including multi-infarct dementia, morbid obesity with a BMI of 46 kg per metered squared, cerebrovascular disease, diabetes mellitus, a complex abdominal surgical history including prior colon resection, takedown colostomy, and hernia repair. Progestin releasing IUD placement on 01/24/19.  She will return in February for follow-up with endometrial biopsy.   HPI: Ms Christine Zavala is a 85 year old P2 who is seen in consultation at the request of Dr. Linda Hedges for a grade 2 endometrial cancer.  The patient reports a one-month history of bleeding which began in July 2020.  Her family immediately sought evaluation with Dr. Lynnette Caffey who performed a transvaginal ultrasound scan on December 17, 2018.  This revealed a uterus measuring 8.1 x 4.5 x 5.1 cm.  The endometrial thickness was increased at 3.2 cm.  The left ovary was not visualized, she had had a history of a prior right salpingo-oophorectomy.  In follow-up to the ultrasound findings she had an endometrial [place] biopsy performed on the same day which revealed a FIGO grade 2 endometrioid adenocarcinoma.  The patient's medical history is significant for colon cancer diagnosed in [place] in 2003.  This was treated with surgery which included a left colectomy (low anterior resection) with temporary colostomy.  Was then followed by chemotherapy and radiation.  The patient and her daughter are unable to recount the staging of her cancer.,  Followed by reversal of the colostomy.  Preceding that she had a prior abdominal hernia repair with mesh.  A cholecystectomy was performed at the time  of 3 for colon cancer surgeries.  She had remained free of disease since that time.  Medical history is also significant for morbid obesity, with a BMI 46 kg/m.  She has diabetes mellitus for which she takes metformin.  And other oral therapies.  She had a DVT after 1 of her colon cancer surgeries in 2003 and was treated temporarily with oral anticoagulant therapy.  She has had multiple TIAs and has known carotid artery disease and takes Plavix.  She has a history of dementia which is progressive, is felt to be from her mini strokes.  Her family history significant with a mother who died from pancreatic cancer.  Her past surgical history includes as stated above.  She has a history of a low anterior resection with temporary colostomy bag formation in 2003, reversal of colostomy and reanastomosis of colon, hernia repair with mesh, cholecystectomy, right salpingo-oophorectomy.  The patient lives with her daughter and son and grandchildren.  She has had 2 prior vaginal births.  Interval Hx:  D&C IUD placement (progestin releasing) was performed on February 17, 2019.  Curettage specimen revealed FIGO grade 2 endometrioid adenocarcinoma.  She has no further bleeding since placement of the Mirena IUD.   Current Meds:  Outpatient Encounter Medications as of 03/22/2019  Medication Sig  . acetaminophen (TYLENOL) 500 MG tablet Take 1,000 mg by mouth every 8 (eight) hours as needed for mild pain.   . Blood Glucose Monitoring Suppl (BLOOD GLUCOSE SYSTEM PAK) KIT Please dispense based on patient and insurance preference. Use as directed to monitor FSBS 2x daily. Dx: E11.9.  [name] clopidogrel (PLAVIX)  75 MG tablet Take 1 tablet (75 mg total) by mouth daily.  . furosemide (LASIX) 40 MG tablet Take 1 tablet (40 mg total) by mouth daily as needed.  [name] glimepiride (AMARYL) 1 MG tablet TAKE 2 TABLETS (2 MG TOTAL) BY MOUTH DAILY WITH BREAKFAST. (Patient taking differently: Take 1 mg by mouth daily with  breakfast. )  . JANUVIA 25 MG tablet TAKE 1 TABLET BY MOUTH  DAILY  . Lancets MISC Please dispense based on patient and insurance preference. Use as directed to monitor FSBS 2x daily. Dx: E11.9.  . levonorgestrel (MIRENA) 20 MCG/24HR IUD 1 Intra Uterine Device (1 each total) by Intrauterine route once for 1 dose. To be inserted in the OR  . loratadine (CLARITIN) 10 MG tablet Take 1 tablet (10 mg total) by mouth daily. (Patient not taking: Reported on 02/14/2019)  . memantine (NAMENDA XR) 7 MG CP24 24 hr capsule TAKE 1 CAPSULE BY MOUTH EVERYDAY AT BEDTIME (Patient taking differently: Take 7 mg by mouth at bedtime. )  . mirabegron ER (MYRBETRIQ) 50 MG TB24 tablet Take 1 tablet (50 mg total) by mouth daily.  Glory Rosebush ULTRA test strip TEST AS DIRECTED 2 X DAILY  . pravastatin (PRAVACHOL) 80 MG tablet Take 80 mg by mouth daily.  [name] trimethoprim (TRIMPEX) 100 MG tablet Take 1 tablet (100 mg total) by mouth daily. Prophylaxis   No facility-administered encounter medications on file as of 03/22/2019.     Allergy:  Allergies  Allergen Reactions  . Aspirin Nausea And Vomiting  . Calcium-Containing Compounds     Legs burn  . Codeine Sulfate Nausea And Vomiting  . Lipitor [Atorvastatin]     myalgias  . Zocor [Simvastatin]     pruritis  . Zyrtec [Cetirizine]     Social Hx:   Social History   Socioeconomic History  . Marital status: Widowed    Spouse name: Not on file  . Number of children: 2  . Years of education: 97  . Highest education level: Not on file  Occupational History  . Occupation: retired    Fish farm manager: RETIRED  Social Needs  . Financial resource strain: Not on file  . Food insecurity    Worry: Not on file    Inability: Not on file  . Transportation needs    Medical: Not on file    Non-medical: Not on file  Tobacco Use  . Smoking status: Former Research scientist (life sciences)  . Smokeless tobacco: Never Used  . Tobacco comment: quit 1970's  Substance and Sexual Activity  . Alcohol use: No   . Drug use: No  . Sexual activity: Not on file  Lifestyle  . Physical activity    Days per week: Not on file    Minutes per session: Not on file  . Stress: Not on file  Relationships  . Social Herbalist on phone: Not on file    Gets together: Not on file    Attends religious service: Not on file    Active member of club or organization: Not on file    Attends meetings of clubs or organizations: Not on file    Relationship status: Not on file  . Intimate partner violence    Fear of current or ex partner: Not on file    Emotionally abused: Not on file    Physically abused: Not on file    Forced sexual activity: Not on file  Other Topics Concern  . Not on file  Social History Narrative  Patient lives at home with her Son Sheliah Plane). Patient is a widowed. Patient is retired. Right handed.   Caffeine - one cup daily    Past Surgical Hx:  Past Surgical History:  Procedure Laterality Date  . CATARACT EXTRACTION, BILATERAL     2001  . COLON SURGERY     2004  . DILATION AND CURETTAGE OF UTERUS N/A 02/17/2019   Procedure: DILATATION AND CURETTAGE;  Surgeon: Everitt Amber, MD;  Location: [facility];  Service: Gynecology;  Laterality: N/A;  . GALLBLADDER SURGERY     2001  . HERNIA REPAIR     2004  . INTRAUTERINE DEVICE (IUD) INSERTION N/A 02/17/2019   Procedure: INTRAUTERINE DEVICE (IUD) INSERTION- MIRENA;  Surgeon: Everitt Amber, MD;  Location: [facility];  Service: Gynecology;  Laterality: N/A;  . ovary removed     2002    Past Medical Hx:  Past Medical History:  Diagnosis Date  . Cancer (HCC)    Rectal  . Cataract   . Dementia ([place])   . Diabetes mellitus without complication ([place])   . DVT (deep venous thrombosis) ([place])   . H/O vitamin D deficiency   . High cholesterol   . Hyperlipidemia   . Hypertension   . Osteoporosis   . Stroke [facility])     Past Gynecological History:  See HPI.  No LMP recorded. Patient is postmenopausal.  Family Hx:  Family History   Problem Relation Age of Onset  . Cancer Mother        Pancreatic  . COPD Mother   . Dementia Father     Review of Systems:  Constitutional  Feels well,    ENT Normal appearing ears and nares bilaterally Skin/Breast  No rash, sores, jaundice, itching, dryness Cardiovascular  No chest pain, shortness of breath, or edema  Pulmonary  No cough or wheeze.  Gastro Intestinal  No nausea, vomitting, or diarrhoea. No bright red blood per rectum, no abdominal pain, change in bowel movement, or constipation.  Genito Urinary  No frequency, urgency, dysuria, + bleeding Musculo Skeletal  No myalgia, arthralgia, joint swelling or pain  Neurologic  No weakness, numbness, change in gait,  Psychology  No depression, anxiety, insomnia.   Vitals:  Blood pressure (!) 107/53, pulse 73, temperature 98.5 F (36.9 C), temperature source Temporal, resp. rate 20, height _0  (1.6 m), weight 260 lb 8 oz (118.2 kg), SpO2 95 %.  Physical Exam: WD in NAD Neck  Supple NROM, without any enlargements.  Lymph Node Survey No cervical supraclavicular or inguinal adenopathy Cardiovascular  Pulse normal rate, regularity and rhythm. S1 and S2 normal.  Lungs  Clear to auscultation bilateraly, without wheezes/crackles/rhonchi. Good air movement.  Skin  No rash/lesions/breakdown  Psychiatry  Alert and oriented to person, place, and time  Abdomen  Normoactive bowel sounds, abdomen soft, non-tender and obese with possible evidence of hernia (midline diastasis in upper abdomen in midline).  Back No CVA tenderness Genito Urinary  Vulva/vagina: Normal external female genitalia.  No lesions. No discharge or bleeding.  Bladder/urethra:  No lesions or masses, well supported bladder  Vagina: normal  Cervix: Normal appearing, no lesions.Flush with vaginal fornix  Uterus: Small, mobile, no parametrial involvement or nodularity. Strings eminating from cervix.   Adnexa: no palpable masses. Rectal   deferred Extremities  No bilateral cyanosis, clubbing or edema.   Thereasa Solo, MD  03/22/2019, 2:01 PM

## 2019-03-22 NOTE — Patient Instructions (Signed)
Please notify Dr Denman George at phone number [phone] if you notice vaginal bleeding, new pelvic or abdominal pains, bloating, feeling full easy, or a change in bladder or bowel function.   Please contact Dr Serita Grit office (at [phone]) in November/December to request an appointment with her for February, 2021.

## 2019-04-18 ENCOUNTER — Other Ambulatory Visit: Payer: Self-pay | Admitting: *Deleted

## 2019-04-18 MED ORDER — MEMANTINE HCL ER 7 MG PO CP24: ORAL_CAPSULE | ORAL | 1 refills | Status: DC

## 2019-05-19 ENCOUNTER — Other Ambulatory Visit: Payer: Self-pay | Admitting: Family Medicine

## 2019-06-14 ENCOUNTER — Ambulatory Visit: Payer: Medicare Other | Admitting: Family Medicine

## 2019-08-02 ENCOUNTER — Other Ambulatory Visit: Payer: Self-pay

## 2019-08-02 ENCOUNTER — Encounter: Payer: Self-pay | Admitting: Family Medicine

## 2019-08-02 ENCOUNTER — Ambulatory Visit (INDEPENDENT_AMBULATORY_CARE_PROVIDER_SITE_OTHER): Payer: Medicare HMO | Admitting: Family Medicine

## 2019-08-02 VITALS — BP 110/62 | HR 100 | Temp 99.2°F | Resp 14 | Ht 63.0 in | Wt 243.0 lb

## 2019-08-02 DIAGNOSIS — G6289 Other specified polyneuropathies: Secondary | ICD-10-CM | POA: Diagnosis not present

## 2019-08-02 DIAGNOSIS — N390 Urinary tract infection, site not specified: Secondary | ICD-10-CM

## 2019-08-02 DIAGNOSIS — N183 Chronic kidney disease, stage 3 unspecified: Secondary | ICD-10-CM

## 2019-08-02 DIAGNOSIS — Z8673 Personal history of transient ischemic attack (TIA), and cerebral infarction without residual deficits: Secondary | ICD-10-CM | POA: Diagnosis not present

## 2019-08-02 DIAGNOSIS — M5136 Other intervertebral disc degeneration, lumbar region: Secondary | ICD-10-CM

## 2019-08-02 DIAGNOSIS — I10 Essential (primary) hypertension: Secondary | ICD-10-CM | POA: Diagnosis not present

## 2019-08-02 DIAGNOSIS — N1832 Chronic kidney disease, stage 3b: Secondary | ICD-10-CM

## 2019-08-02 DIAGNOSIS — R300 Dysuria: Secondary | ICD-10-CM | POA: Diagnosis not present

## 2019-08-02 DIAGNOSIS — E1122 Type 2 diabetes mellitus with diabetic chronic kidney disease: Secondary | ICD-10-CM

## 2019-08-02 DIAGNOSIS — R269 Unspecified abnormalities of gait and mobility: Secondary | ICD-10-CM

## 2019-08-02 DIAGNOSIS — E1121 Type 2 diabetes mellitus with diabetic nephropathy: Secondary | ICD-10-CM | POA: Diagnosis not present

## 2019-08-02 DIAGNOSIS — F0150 Vascular dementia without behavioral disturbance: Secondary | ICD-10-CM

## 2019-08-02 LAB — URINALYSIS, ROUTINE W REFLEX MICROSCOPIC
Bilirubin Urine: NEGATIVE
Glucose, UA: NEGATIVE
Ketones, ur: NEGATIVE
Nitrite: NEGATIVE
Specific Gravity, Urine: 1.02 (ref 1.001–1.03)
pH: 6 (ref 5.0–8.0)

## 2019-08-02 LAB — MICROSCOPIC MESSAGE

## 2019-08-02 NOTE — Patient Instructions (Addendum)
Wheelchair to be ordered Referral to physical therapy  Referral to dementia program  F/u 4 months

## 2019-08-02 NOTE — Progress Notes (Signed)
Subjective:    Patient ID: Christine Zavala, female    DOB: 1934/03/05, 86 y.o.   MRN: [id number]  Patient presents for Weakness (fatigue, lethargy, dysuria) and Memory Issues (confusion/ short term memor issues)  Pt here with daughter, her memory has continued to worsen.. She is not aggressive or agitated  But doesn't recall many events, gets confused with her meds and day to day activities. She was tried on aricept and Namenda in the past and had side effects with both. Now taking low dose of Namenda 7mg  XR  She had a fall in the yard a month ago and it too 2 people to get her up.  The family is moving into a new home and needs a caregiver to help assist with mom. She is not able to prepare meals herself, needs help with ADL's. She seems weaker and weaker every day, now barely takes a few steps with her walker, sits in chair all the time and when in public she is in wheelchair. They request a wheelchair for the car to help transport her  History of UTI- would like to check urine, has odor to urine and darker , no complaint of abd pain    DM- last week was over 300, last A1C 8.2%, in Sept  2020 this AM 235 They did not bring her meds or meter today  She is taking amaryl 1mg  once a day and [place] 25mg  once a day   For UTI prophylaxiss on trimpex 100mg  daily  She has known grade 2 endometrial cancer, but due to risk factors and age, hysterctomy not recommended, she instead had D& C with Mirena placement in Sept     Review Of Systems: per daughter   GEN- denies fatigue, fever, weight loss,+weakness, recent illness HEENT- denies eye drainage, change in vision, nasal discharge, CVS- denies chest pain, palpitations RESP- denies SOB, cough, wheeze ABD- denies N/V, change in stools, abd pain GU- denies dysuria, hematuria, dribbling, incontinence MSK- denies joint pain, muscle aches, injury Neuro- denies headache, dizziness, syncope, seizure activity       Objective:    BP 110/62   Pulse  100   Temp 99.2 F (37.3 C) (Temporal)   Resp 14   Ht 5\' 3"  (1.6 m)   Wt 243 lb (110.2 kg)   SpO2 97%   BMI 43.05 kg/m  GEN- NAD, alert and oriented xperson, sitting in wheelchair  HEENT- PERRL, EOMI, non injected sclera, pink conjunctiva, MMM, oropharynx clear Neck- Supple, no thyromegaly CVS- RRR, no murmur RESP-CTAB ABD-NABS,soft,NT,ND, no CVA tenderness Neuro- answered  1928 to year, could not tell me she was in doctors office, very pleasant, could not recall meds, or sugars  MSK- Decreased ROM upper and lower ext, weakness noted 4/5 LE  EXT- chronic non pitting edema - just above ankles  Pulses- Radial  2+        Assessment & Plan:      Problem List Items Addressed This Visit      Unprioritized   Abnormality of gait   Relevant Orders   Ambulatory referral to Home Health   CKD stage 3 due to type 2 diabetes mellitus ([place])   DDD (degenerative disc disease), lumbar   Relevant Orders   Ambulatory referral to Home Health   Dementia [facility])    Mixed dementia which is progressive She requires 24 hour care Will see if she qualifies for the local dementia HH program Increase Namenda to 14mg  XR see if she tolerates PT  needed for strengthing and gait  Wheelchair needed to help with mobility within the home and in the community      Relevant Medications   memantine (NAMENDA XR) 14 MG CP24 24 hr capsule   Other Relevant Orders   Ambulatory referral to [place]   History of CVA (cerebrovascular accident)   Relevant Orders   Lipid panel (Completed)   Ambulatory referral to [place]   Hypertension    Controlled no changes       Relevant Orders   CBC with Differential/Platelet (Completed)   Comprehensive metabolic panel (Completed)   TSH (Completed)   Peripheral neuropathy   Relevant Medications   memantine (NAMENDA XR) 14 MG CP24 24 hr capsule   Other Relevant Orders   Ambulatory referral to [place]   Type 2 diabetes mellitus with diabetic chronic  kidney disease ([place])    Uncontrolled, increase amaryl to 2mg  daily Continue januvia  Pt refuses to check sugar regulary in setting of dementia       Relevant Orders   Hemoglobin A1c (Completed)   Lipid panel (Completed)    Other Visit Diagnoses    Recurrent UTI    -  Primary   Treat with Cipro 250mg  BID for days, culture shows contamination but WBC elevated and no other obvious source of infection   Relevant Orders   Urinalysis, Routine w reflex microscopic (Completed)   Urine Culture (Completed)      Note: This dictation was prepared with Dragon dictation along with smaller phrase technology. Any transcriptional errors that result from this process are unintentional.

## 2019-08-03 ENCOUNTER — Encounter: Payer: Self-pay | Admitting: Family Medicine

## 2019-08-03 LAB — CBC WITH DIFFERENTIAL/PLATELET
Absolute Monocytes: 1018 cells/uL — ABNORMAL HIGH (ref 200–950)
Basophils Absolute: 40 cells/uL (ref 0–200)
Basophils Relative: 0.3 %
Eosinophils Absolute: 40 cells/uL (ref 15–500)
Eosinophils Relative: 0.3 %
HCT: 40.5 % (ref 35.0–45.0)
Hemoglobin: 13.1 g/dL (ref 11.7–15.5)
Lymphs Abs: 1286 cells/uL (ref 850–3900)
MCH: 29.6 pg (ref 27.0–33.0)
MCHC: 32.3 g/dL (ref 32.0–36.0)
MCV: 91.6 fL (ref 80.0–100.0)
MPV: 11.3 fL (ref 7.5–12.5)
Monocytes Relative: 7.6 %
Neutro Abs: 11015 cells/uL — ABNORMAL HIGH (ref 1500–7800)
Neutrophils Relative %: 82.2 %
Platelets: 249 10*3/uL (ref 140–400)
RBC: 4.42 10*6/uL (ref 3.80–5.10)
RDW: 13.8 % (ref 11.0–15.0)
Total Lymphocyte: 9.6 %
WBC: 13.4 10*3/uL — ABNORMAL HIGH (ref 3.8–10.8)

## 2019-08-03 LAB — COMPREHENSIVE METABOLIC PANEL
AG Ratio: 1.2 (calc) (ref 1.0–2.5)
ALT: 11 U/L (ref 6–29)
AST: 21 U/L (ref 10–35)
Albumin: 3.7 g/dL (ref 3.6–5.1)
Alkaline phosphatase (APISO): 177 U/L — ABNORMAL HIGH (ref 37–153)
BUN/Creatinine Ratio: 17 (calc) (ref 6–22)
BUN: 25 mg/dL (ref 7–25)
CO2: 22 mmol/L (ref 20–32)
Calcium: 9 mg/dL (ref 8.6–10.4)
Chloride: 100 mmol/L (ref 98–110)
Creat: 1.49 mg/dL — ABNORMAL HIGH (ref 0.60–0.88)
Globulin: 3 g/dL (calc) (ref 1.9–3.7)
Glucose, Bld: 270 mg/dL — ABNORMAL HIGH (ref 65–99)
Potassium: 4.1 mmol/L (ref 3.5–5.3)
Sodium: 135 mmol/L (ref 135–146)
Total Bilirubin: 0.6 mg/dL (ref 0.2–1.2)
Total Protein: 6.7 g/dL (ref 6.1–8.1)

## 2019-08-03 LAB — HEMOGLOBIN A1C
Hgb A1c MFr Bld: 11.2 % of total Hgb — ABNORMAL HIGH (ref <5.7)
Mean Plasma Glucose: 275 (calc)
eAG (mmol/L): 15.2 (calc)

## 2019-08-03 LAB — LIPID PANEL
Cholesterol: 244 mg/dL — ABNORMAL HIGH (ref <200)
HDL: 27 mg/dL — ABNORMAL LOW (ref >=50)
LDL Cholesterol (Calc): 178 mg/dL (calc) — ABNORMAL HIGH
Non-HDL Cholesterol (Calc): 217 mg/dL (calc) — ABNORMAL HIGH (ref <130)
Total CHOL/HDL Ratio: 9 (calc) — ABNORMAL HIGH (ref <5.0)
Triglycerides: 216 mg/dL — ABNORMAL HIGH (ref <150)

## 2019-08-03 LAB — URINE CULTURE
MICRO NUMBER:: 10231039
SPECIMEN QUALITY:: ADEQUATE

## 2019-08-03 LAB — TSH: TSH: 2.51 mIU/L (ref 0.40–4.50)

## 2019-08-03 MED ORDER — CIPROFLOXACIN HCL 250 MG PO TABS: 250.0000 mg | ORAL_TABLET | Freq: Two times a day (BID) | ORAL | 0 refills | Status: DC

## 2019-08-03 MED ORDER — MEMANTINE HCL ER 14 MG PO CP24: ORAL_CAPSULE | ORAL | 1 refills | Status: AC

## 2019-08-03 NOTE — Assessment & Plan Note (Signed)
Controlled no changes 

## 2019-08-03 NOTE — Assessment & Plan Note (Signed)
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

## 2019-08-03 NOTE — Assessment & Plan Note (Signed)
Uncontrolled, increase amaryl to 2mg  daily Continue januvia  Pt refuses to check sugar regulary in setting of dementia

## 2019-08-08 ENCOUNTER — Telehealth: Payer: Self-pay | Admitting: Family Medicine

## 2019-08-08 MED ORDER — FUROSEMIDE 40 MG PO TABS: 40.0000 mg | ORAL_TABLET | Freq: Every day | ORAL | 3 refills | Status: DC | PRN

## 2019-08-08 MED ORDER — PRAVASTATIN SODIUM 80 MG PO TABS: 80.0000 mg | ORAL_TABLET | Freq: Every day | ORAL | 3 refills | Status: DC

## 2019-08-08 MED ORDER — GLIMEPIRIDE 1 MG PO TABS: 2.0000 mg | ORAL_TABLET | Freq: Every day | ORAL | 1 refills | Status: DC

## 2019-08-08 MED ORDER — CLOPIDOGREL BISULFATE 75 MG PO TABS: 75.0000 mg | ORAL_TABLET | Freq: Every day | ORAL | 3 refills | Status: AC

## 2019-08-08 NOTE — Telephone Encounter (Signed)
Prescription sent to pharmacy.   Call placed to patient daughter [name]. Katie.

## 2019-08-08 NOTE — Telephone Encounter (Signed)
Returned call to patient daughter [name].   Inquired about equipment and [place] orders. Advised equipment was sent to [facility] and [place] orders were sent to [facility].

## 2019-08-08 NOTE — Telephone Encounter (Signed)
Patient needs refills on glimepripiride, furosemide, pravastatin, and clopidogrel  Also needs to talk about a prescription that was put in for equiptment, and some specifics that needs to go along with it  [phone]

## 2019-08-09 ENCOUNTER — Telehealth: Payer: Self-pay | Admitting: *Deleted

## 2019-08-09 NOTE — Telephone Encounter (Signed)
Received call from [facility].   Reports that case cannot be opened at this time as they have no capacity.   MD to be made aware.

## 2019-08-09 NOTE — Telephone Encounter (Signed)
Can you try another agency for the [place], nurse aide  I wanted her in the Dementia program, but it seems [facility] doesn't have availability at this time I dont know if another agency has this program, but she at least needs the above

## 2019-08-09 NOTE — Telephone Encounter (Signed)
Reached out to [employer] with [facility] to see if he can accept as [place], and [facility] can not take unless we have a traditional medicare to accompany this referral. Awaiting response.

## 2019-08-12 NOTE — Telephone Encounter (Signed)
   Social Worker is fine Speech therapy is not needed

## 2019-08-12 NOTE — Telephone Encounter (Signed)
Lattie Haw with [facility] notified of orders to add social work and speech therapy

## 2019-08-12 NOTE — Telephone Encounter (Signed)
Received call from [facility] with [facility]. [phone]~ telephone.   Reports that they have availability and will see patient for PT.   Inquired as to if MD would like to add SW and ST evaluations.   MD please advise.

## 2019-08-12 NOTE — Telephone Encounter (Signed)
Left message return call

## 2019-08-14 DIAGNOSIS — C541 Malignant neoplasm of endometrium: Secondary | ICD-10-CM | POA: Diagnosis not present

## 2019-08-14 DIAGNOSIS — E1142 Type 2 diabetes mellitus with diabetic polyneuropathy: Secondary | ICD-10-CM | POA: Diagnosis not present

## 2019-08-14 DIAGNOSIS — F0150 Vascular dementia without behavioral disturbance: Secondary | ICD-10-CM | POA: Diagnosis not present

## 2019-08-14 DIAGNOSIS — I129 Hypertensive chronic kidney disease with stage 1 through stage 4 chronic kidney disease, or unspecified chronic kidney disease: Secondary | ICD-10-CM | POA: Diagnosis not present

## 2019-08-14 DIAGNOSIS — N390 Urinary tract infection, site not specified: Secondary | ICD-10-CM | POA: Diagnosis not present

## 2019-08-14 DIAGNOSIS — E1122 Type 2 diabetes mellitus with diabetic chronic kidney disease: Secondary | ICD-10-CM | POA: Diagnosis not present

## 2019-08-14 DIAGNOSIS — N183 Chronic kidney disease, stage 3 unspecified: Secondary | ICD-10-CM | POA: Diagnosis not present

## 2019-08-14 DIAGNOSIS — M5136 Other intervertebral disc degeneration, lumbar region: Secondary | ICD-10-CM | POA: Diagnosis not present

## 2019-08-14 DIAGNOSIS — I69398 Other sequelae of cerebral infarction: Secondary | ICD-10-CM | POA: Diagnosis not present

## 2019-08-15 ENCOUNTER — Emergency Department (HOSPITAL_COMMUNITY): Payer: Medicare HMO

## 2019-08-15 ENCOUNTER — Encounter (HOSPITAL_COMMUNITY): Payer: Self-pay | Admitting: Emergency Medicine

## 2019-08-15 ENCOUNTER — Ambulatory Visit (INDEPENDENT_AMBULATORY_CARE_PROVIDER_SITE_OTHER): Payer: Medicare HMO | Admitting: Family Medicine

## 2019-08-15 ENCOUNTER — Encounter: Payer: Self-pay | Admitting: Family Medicine

## 2019-08-15 ENCOUNTER — Other Ambulatory Visit: Payer: Self-pay

## 2019-08-15 ENCOUNTER — Inpatient Hospital Stay (HOSPITAL_COMMUNITY)
Admission: EM | Admit: 2019-08-15 | Discharge: 2019-08-22 | DRG: 435 | Disposition: A | Discharge status: Home Health | Payer: Medicare HMO | Attending: Internal Medicine | Admitting: Internal Medicine

## 2019-08-15 VITALS — BP 130/78 | HR 100 | Temp 98.9°F | Resp 16

## 2019-08-15 DIAGNOSIS — R110 Nausea: Secondary | ICD-10-CM | POA: Diagnosis not present

## 2019-08-15 DIAGNOSIS — M810 Age-related osteoporosis without current pathological fracture: Secondary | ICD-10-CM | POA: Diagnosis present

## 2019-08-15 DIAGNOSIS — I4510 Unspecified right bundle-branch block: Secondary | ICD-10-CM | POA: Diagnosis not present

## 2019-08-15 DIAGNOSIS — N183 Chronic kidney disease, stage 3 unspecified: Secondary | ICD-10-CM | POA: Diagnosis present

## 2019-08-15 DIAGNOSIS — G9341 Metabolic encephalopathy: Secondary | ICD-10-CM | POA: Diagnosis not present

## 2019-08-15 DIAGNOSIS — C801 Malignant (primary) neoplasm, unspecified: Secondary | ICD-10-CM

## 2019-08-15 DIAGNOSIS — Z8673 Personal history of transient ischemic attack (TIA), and cerebral infarction without residual deficits: Secondary | ICD-10-CM | POA: Diagnosis not present

## 2019-08-15 DIAGNOSIS — Z886 Allergy status to analgesic agent status: Secondary | ICD-10-CM

## 2019-08-15 DIAGNOSIS — M2550 Pain in unspecified joint: Secondary | ICD-10-CM | POA: Diagnosis not present

## 2019-08-15 DIAGNOSIS — I129 Hypertensive chronic kidney disease with stage 1 through stage 4 chronic kidney disease, or unspecified chronic kidney disease: Secondary | ICD-10-CM | POA: Diagnosis present

## 2019-08-15 DIAGNOSIS — Z515 Encounter for palliative care: Secondary | ICD-10-CM | POA: Diagnosis not present

## 2019-08-15 DIAGNOSIS — Z7401 Bed confinement status: Secondary | ICD-10-CM | POA: Diagnosis not present

## 2019-08-15 DIAGNOSIS — Z7902 Long term (current) use of antithrombotics/antiplatelets: Secondary | ICD-10-CM

## 2019-08-15 DIAGNOSIS — F0150 Vascular dementia without behavioral disturbance: Secondary | ICD-10-CM | POA: Diagnosis not present

## 2019-08-15 DIAGNOSIS — C787 Secondary malignant neoplasm of liver and intrahepatic bile duct: Principal | ICD-10-CM | POA: Diagnosis present

## 2019-08-15 DIAGNOSIS — E559 Vitamin D deficiency, unspecified: Secondary | ICD-10-CM | POA: Diagnosis present

## 2019-08-15 DIAGNOSIS — C799 Secondary malignant neoplasm of unspecified site: Secondary | ICD-10-CM

## 2019-08-15 DIAGNOSIS — R112 Nausea with vomiting, unspecified: Secondary | ICD-10-CM | POA: Diagnosis not present

## 2019-08-15 DIAGNOSIS — Z20822 Contact with and (suspected) exposure to covid-19: Secondary | ICD-10-CM | POA: Diagnosis present

## 2019-08-15 DIAGNOSIS — R6510 Systemic inflammatory response syndrome (SIRS) of non-infectious origin without acute organ dysfunction: Secondary | ICD-10-CM | POA: Diagnosis present

## 2019-08-15 DIAGNOSIS — M5136 Other intervertebral disc degeneration, lumbar region: Secondary | ICD-10-CM | POA: Diagnosis present

## 2019-08-15 DIAGNOSIS — Z7189 Other specified counseling: Secondary | ICD-10-CM | POA: Diagnosis not present

## 2019-08-15 DIAGNOSIS — Z800 Family history of malignant neoplasm of digestive organs: Secondary | ICD-10-CM | POA: Diagnosis not present

## 2019-08-15 DIAGNOSIS — R531 Weakness: Secondary | ICD-10-CM | POA: Diagnosis not present

## 2019-08-15 DIAGNOSIS — Z825 Family history of asthma and other chronic lower respiratory diseases: Secondary | ICD-10-CM

## 2019-08-15 DIAGNOSIS — R269 Unspecified abnormalities of gait and mobility: Secondary | ICD-10-CM | POA: Diagnosis not present

## 2019-08-15 DIAGNOSIS — Z87891 Personal history of nicotine dependence: Secondary | ICD-10-CM

## 2019-08-15 DIAGNOSIS — K5730 Diverticulosis of large intestine without perforation or abscess without bleeding: Secondary | ICD-10-CM | POA: Diagnosis not present

## 2019-08-15 DIAGNOSIS — E785 Hyperlipidemia, unspecified: Secondary | ICD-10-CM | POA: Diagnosis present

## 2019-08-15 DIAGNOSIS — E1165 Type 2 diabetes mellitus with hyperglycemia: Secondary | ICD-10-CM | POA: Diagnosis present

## 2019-08-15 DIAGNOSIS — Z7984 Long term (current) use of oral hypoglycemic drugs: Secondary | ICD-10-CM | POA: Diagnosis not present

## 2019-08-15 DIAGNOSIS — E1122 Type 2 diabetes mellitus with diabetic chronic kidney disease: Secondary | ICD-10-CM | POA: Diagnosis present

## 2019-08-15 DIAGNOSIS — C7982 Secondary malignant neoplasm of genital organs: Secondary | ICD-10-CM

## 2019-08-15 DIAGNOSIS — C7800 Secondary malignant neoplasm of unspecified lung: Secondary | ICD-10-CM | POA: Diagnosis not present

## 2019-08-15 DIAGNOSIS — N390 Urinary tract infection, site not specified: Secondary | ICD-10-CM | POA: Diagnosis not present

## 2019-08-15 DIAGNOSIS — Z6841 Body Mass Index (BMI) 40.0 and over, adult: Secondary | ICD-10-CM | POA: Diagnosis not present

## 2019-08-15 DIAGNOSIS — Z975 Presence of (intrauterine) contraceptive device: Secondary | ICD-10-CM

## 2019-08-15 DIAGNOSIS — R918 Other nonspecific abnormal finding of lung field: Secondary | ICD-10-CM | POA: Diagnosis not present

## 2019-08-15 DIAGNOSIS — C541 Malignant neoplasm of endometrium: Secondary | ICD-10-CM | POA: Diagnosis present

## 2019-08-15 DIAGNOSIS — Z888 Allergy status to other drugs, medicaments and biological substances status: Secondary | ICD-10-CM

## 2019-08-15 DIAGNOSIS — Z66 Do not resuscitate: Secondary | ICD-10-CM | POA: Diagnosis not present

## 2019-08-15 DIAGNOSIS — R1011 Right upper quadrant pain: Secondary | ICD-10-CM

## 2019-08-15 DIAGNOSIS — D72829 Elevated white blood cell count, unspecified: Secondary | ICD-10-CM

## 2019-08-15 DIAGNOSIS — G9340 Encephalopathy, unspecified: Secondary | ICD-10-CM | POA: Diagnosis not present

## 2019-08-15 DIAGNOSIS — Z885 Allergy status to narcotic agent status: Secondary | ICD-10-CM

## 2019-08-15 LAB — URINALYSIS, ROUTINE W REFLEX MICROSCOPIC
Bacteria, UA: NONE SEEN /HPF
Bilirubin Urine: NEGATIVE
Glucose, UA: NEGATIVE
Hgb urine dipstick: NEGATIVE
Hyaline Cast: NONE SEEN /LPF
Ketones, ur: NEGATIVE
Leukocytes,Ua: NEGATIVE
Nitrite: NEGATIVE
RBC / HPF: NONE SEEN /HPF (ref 0–2)
Specific Gravity, Urine: 1.02 (ref 1.001–1.03)
pH: 5.5 (ref 5.0–8.0)

## 2019-08-15 LAB — CBC WITH DIFFERENTIAL/PLATELET
Absolute Monocytes: 1243 cells/uL — ABNORMAL HIGH (ref 200–950)
Basophils Absolute: 34 cells/uL (ref 0–200)
Basophils Relative: 0.2 %
Eosinophils Absolute: 17 cells/uL (ref 15–500)
Eosinophils Relative: 0.1 %
HCT: 38.5 % (ref 35.0–45.0)
Hemoglobin: 12.6 g/dL (ref 11.7–15.5)
Lymphs Abs: 655 cells/uL — ABNORMAL LOW (ref 850–3900)
MCH: 29.5 pg (ref 27.0–33.0)
MCHC: 32.7 g/dL (ref 32.0–36.0)
MCV: 90.2 fL (ref 80.0–100.0)
MPV: 11.3 fL (ref 7.5–12.5)
Monocytes Relative: 7.4 %
Neutro Abs: 14851 cells/uL — ABNORMAL HIGH (ref 1500–7800)
Neutrophils Relative %: 88.4 %
Platelets: 239 10*3/uL (ref 140–400)
RBC: 4.27 10*6/uL (ref 3.80–5.10)
RDW: 13.7 % (ref 11.0–15.0)
Total Lymphocyte: 3.9 %
WBC: 16.8 10*3/uL — ABNORMAL HIGH (ref 3.8–10.8)

## 2019-08-15 LAB — COMPREHENSIVE METABOLIC PANEL
AG Ratio: 1.1 (calc) (ref 1.0–2.5)
ALT: 26 U/L (ref 6–29)
AST: 52 U/L — ABNORMAL HIGH (ref 10–35)
Albumin: 3.1 g/dL — ABNORMAL LOW (ref 3.6–5.1)
Alkaline phosphatase (APISO): 283 U/L — ABNORMAL HIGH (ref 37–153)
BUN/Creatinine Ratio: 19 (calc) (ref 6–22)
BUN: 23 mg/dL (ref 7–25)
CO2: 22 mmol/L (ref 20–32)
Calcium: 8.6 mg/dL (ref 8.6–10.4)
Chloride: 97 mmol/L — ABNORMAL LOW (ref 98–110)
Creat: 1.22 mg/dL — ABNORMAL HIGH (ref 0.60–0.88)
Globulin: 2.9 g/dL (calc) (ref 1.9–3.7)
Glucose, Bld: 283 mg/dL — ABNORMAL HIGH (ref 65–99)
Potassium: 3.8 mmol/L (ref 3.5–5.3)
Sodium: 134 mmol/L — ABNORMAL LOW (ref 135–146)
Total Bilirubin: 0.9 mg/dL (ref 0.2–1.2)
Total Protein: 6 g/dL — ABNORMAL LOW (ref 6.1–8.1)

## 2019-08-15 LAB — MICROSCOPIC MESSAGE

## 2019-08-15 LAB — BASIC METABOLIC PANEL
Anion gap: 13 (ref 5–15)
BUN: 28 mg/dL — ABNORMAL HIGH (ref 8–23)
CO2: 22 mmol/L (ref 22–32)
Calcium: 8.6 mg/dL — ABNORMAL LOW (ref 8.9–10.3)
Chloride: 100 mmol/L (ref 98–111)
Creatinine, Ser: 1.33 mg/dL — ABNORMAL HIGH (ref 0.44–1.00)
GFR calc Af Amer: 43 mL/min — ABNORMAL LOW (ref >60)
GFR calc non Af Amer: 37 mL/min — ABNORMAL LOW (ref >60)
Glucose, Bld: 305 mg/dL — ABNORMAL HIGH (ref 70–99)
Potassium: 3.8 mmol/L (ref 3.5–5.1)
Sodium: 135 mmol/L (ref 135–145)

## 2019-08-15 LAB — LIPASE: Lipase: 11 U/L (ref 7–60)

## 2019-08-15 LAB — HEPATIC FUNCTION PANEL
ALT: 31 U/L (ref 0–44)
AST: 51 U/L — ABNORMAL HIGH (ref 15–41)
Albumin: 2.8 g/dL — ABNORMAL LOW (ref 3.5–5.0)
Alkaline Phosphatase: 263 U/L — ABNORMAL HIGH (ref 38–126)
Bilirubin, Direct: 0.2 mg/dL (ref 0.0–0.2)
Indirect Bilirubin: 0.6 mg/dL (ref 0.3–0.9)
Total Bilirubin: 0.8 mg/dL (ref 0.3–1.2)
Total Protein: 6.7 g/dL (ref 6.5–8.1)

## 2019-08-15 LAB — CBC
HCT: 40 % (ref 36.0–46.0)
Hemoglobin: 12.4 g/dL (ref 12.0–15.0)
MCH: 29 pg (ref 26.0–34.0)
MCHC: 31 g/dL (ref 30.0–36.0)
MCV: 93.5 fL (ref 80.0–100.0)
Platelets: 258 10*3/uL (ref 150–400)
RBC: 4.28 MIL/uL (ref 3.87–5.11)
RDW: 14.3 % (ref 11.5–15.5)
WBC: 19 10*3/uL — ABNORMAL HIGH (ref 4.0–10.5)
nRBC: 0 % (ref 0.0–0.2)

## 2019-08-15 LAB — CBG MONITORING, ED: Glucose-Capillary: 288 mg/dL — ABNORMAL HIGH (ref 70–99)

## 2019-08-15 LAB — LACTIC ACID, PLASMA: Lactic Acid, Venous: 1.7 mmol/L (ref 0.5–1.9)

## 2019-08-15 LAB — TROPONIN I (HIGH SENSITIVITY): Troponin I (High Sensitivity): 10 ng/L (ref <18)

## 2019-08-15 LAB — AMMONIA: Ammonia: 13 umol/L (ref 9–35)

## 2019-08-15 MED ORDER — ONDANSETRON 4 MG PO TBDP: 4.0000 mg | ORAL_TABLET | Freq: Three times a day (TID) | ORAL | 0 refills | Status: AC | PRN

## 2019-08-15 MED ORDER — SODIUM CHLORIDE (PF) 0.9 % IJ SOLN
INTRAMUSCULAR | Status: AC
  Filled 2019-08-15: qty 50

## 2019-08-15 MED ORDER — SODIUM CHLORIDE 0.9 % IV BOLUS
500.0000 mL | Freq: Once | INTRAVENOUS | Status: AC
  Administered 2019-08-15: 500 mL via INTRAVENOUS

## 2019-08-15 MED ORDER — IOHEXOL 300 MG/ML  SOLN
100.0000 mL | Freq: Once | INTRAMUSCULAR | Status: AC | PRN
  Administered 2019-08-15: 75 mL via INTRAVENOUS

## 2019-08-15 MED ORDER — ONDANSETRON HCL 4 MG/2ML IJ SOLN
4.0000 mg | Freq: Once | INTRAMUSCULAR | Status: AC
  Administered 2019-08-15: 4 mg via INTRAVENOUS
  Filled 2019-08-15: qty 2

## 2019-08-15 NOTE — Telephone Encounter (Signed)
Clarification noted in Referral

## 2019-08-15 NOTE — ED Notes (Signed)
Provided pt w/labeled urine and culture specimen for collection per MD order. [employer]

## 2019-08-15 NOTE — ED Notes (Signed)
Labeled blue/dk green/lt green/lav bld tubes sent to lab. [facility]

## 2019-08-15 NOTE — Telephone Encounter (Signed)
Noted  Please clarify in the referral notes, since multiple agencies were called

## 2019-08-15 NOTE — ED Triage Notes (Signed)
Patient BIB daughter, reports patient was seen at PCP this morning after weakness and increased confusion x2 weeks. Denies N/V/D, chest pain, SOB, urinary sx. Hx dementia. Daughter states PCP sent for further evaluation of elevated WBC and liver enzymes.

## 2019-08-15 NOTE — Progress Notes (Signed)
Subjective:    Patient ID: Christine Zavala, female    DOB: 1934-03-21, 86 y.o.   MRN: [id number]  Patient presents for [name] (states that she has fatigue,malaise, nausea) Patient here with her daughter.  Over the past week and a half she has continued to decline.  She is not eating and drinking very well.  She is very weak.  She is also more confused.  She also complained of some right upper quadrant pain along with severe nausea but she has not had any vomiting.  There has been no diarrhea.  She has not had any fever.  She was treated with antibiotics for presumptive urinary tract infection on 3/ 9 with Cipro she completed these antibiotics but seemed to decline since then She has not had any cough or congestion no chills. Daughter did asked about COVID-19 her and her brother who are her caregivers have not had any symptoms but they were worried about this and wants to see if she could be tested.    Her diabetes is also uncontrolled A1c was at 11% a couple weeks ago this morning her CBG was 250 despite the increase in  glimepiride and her Januvia     Review Of Systems:  GEN- denies fatigue, fever, weight loss,+weakness, recent illness HEENT- denies eye drainage, change in vision, nasal discharge, CVS- denies chest pain, palpitations RESP- denies SOB, cough, wheeze ABD- denies N/V, change in stools, +abd pain GU- denies dysuria, hematuria, dribbling, incontinence MSK- denies joint pain, muscle aches, injury Neuro- denies headache, dizziness, syncope, seizure activity       Objective:    BP 130/78   Pulse 100   Temp 98.9 F (37.2 C) (Temporal)   Resp 16   SpO2 97%  GEN- NAD, alert and oriented x 2, sitting in wheelchair  HEENT- PERRL, EOMI, non injected sclera, pink conjunctiva, mild dry MM, oropharynx clear Neck- Supple, no LAD CVS-  Mild tachycarda HR 90-100 at rest, no murmur RESP-CTAB ABD-NABS,NT,ND  GU- Sterile cath obtained  MSK- Decreased ROM upper and lower ext EXT-  chronic non pitting edema         Assessment & Plan:      Problem List Items Addressed This Visit    None    Visit Diagnoses    Weakness generalized    -  Primary     Generalized weakness with increase in white blood cell count now at 16,000.  Her urine cath was obtained here in the office was normal with exception of protein but negative for leukocytes and nitrates.  She did not have an acute abdomen on exam.  She does have underlying dementia so seem to be more at her baseline but she did appear more weak.  Obtained that labs per above she also had elevated alk phos as well as mildly elevated liver function test.  I did Rx Zofran in the meantime as we were waiting for her labs to come back to make a decision on sending her to the hospital or not she still not able to eat or drink very well because of the nausea.  Of advised her daughter to take her to the emergency room to be evaluated.  We did do Covid testing in the office however this will take 48 hours to return.  Urine culture was also sent.  Patient's daughter voiced understanding.  Differential diagnosis includes viral illness other GI etiology they doubt colitis that she is not having any bowel changes.  Possible she still  could have a residual urinary tract infection but this did not show up on the urinalysis.  COVID-19 infection is also possibility based on her presentation.  Of note if she is admitted to the hospital she recently had home health arranged for physical therapy social work and nursing   Relevant Orders   CBC with Differential/Platelet (Completed)   Comprehensive metabolic panel (Completed)   Lipase (Completed)   SARS-COV-2 RNA,(COVID-19) QUAL NAAT   RUQ pain       Recurrent UTI       Relevant Orders   Urine Culture   Urinalysis, Routine w reflex microscopic (Completed)   Nausea          Note: This dictation was prepared with Dragon dictation along with smaller phrase technology. Any transcriptional errors  that result from this process are unintentional.

## 2019-08-15 NOTE — Telephone Encounter (Signed)
Received call from [place], [facility] PT with [place] [phone]~ telephone. Requested VO for [facility] PT 2x weekly x3 weeks for muscles strengthening, functional mobility and independence with ADL's.  Noted that call from [facility] with [facility] [phone] telephone had been received last week. VO had been given to [place] per notes.   Call placed to patient daughter, [name]. States that [facility] had completed PT evaluation, and [facility] had not been able to come out yet. States that she would like to continue services with [facility].  Also reports that she would like to continue with [facility] to assist with community programs.   Call placed to [facility] with [facility] [phone] telephone. Advised that services would not be required at his time via VM.   Call placed to [facility] [place] with [place] [phone] telephone. VO given per VM. Contacted [facility] [phone] telephone and gave order for [facility] for community assistance.

## 2019-08-15 NOTE — ED Provider Notes (Signed)
[facility] DEPT Provider Note   CSN: [id number] Arrival date & time: 08/15/19  1748     History Chief Complaint  Patient presents with  . Weakness    Christine Zavala is a 86 y.o. female.  The history is provided by the patient and medical records.  Weakness Associated symptoms: nausea     86 year old female with history of endometrial cancer, dementia, diabetes, hyperlipidemia, hypertension, prior stroke, osteoporosis, presenting to the ED with generalized weakness.  Apparently this has been going on for quite some time but has precipitously worsened over the past week and a half.  Patient reports she feels very weak, especially when trying to get up and move around.  She does use a cane and walker that she uses for assistance.  She reports persistent nausea which makes her not want to eat and drink.  She denies any vomiting.  Has had some intermittent abdominal pain but denies this currently. She has not noticed any fevers.  They did see PCP today, there seemed to be some confusion in the office, however patient denies this.  She was treated with Cipro for UTI on 08/02/19 but no improvement.  No cough, nasal congestion, sore throat, chest pain, or SOB.  No sick contacts or noted COVID exposures.  She did have blood work at PCP today along with [facility] showed WBC count of 19K, elevated LFT's and alk phos.  She was sent here for further evaluation, recommended CT of the abdomen.    Past Medical History:  Diagnosis Date  . Cancer (HCC)    Rectal  . Cataract   . Dementia ([place])   . Diabetes mellitus without complication ([place])   . DVT (deep venous thrombosis) ([place])   . H/O vitamin D deficiency   . High cholesterol   . Hyperlipidemia   . Hypertension   . Osteoporosis   . Stroke [facility])     Patient Active Problem List   Diagnosis Date Noted  . Endometrial adenocarcinoma ([place]) 01/24/2019  . Morbid obesity with BMI of 45.0-49.9, adult ([place]) 01/24/2019    . History of colon cancer 01/24/2019  . History of DVT (deep vein thrombosis) 01/24/2019  . Abnormal uterine bleeding (AUB) 01/24/2019  . CKD stage 3 due to type 2 diabetes mellitus ([place]) 05/04/2018  . Acute on chronic renal failure ([place]) 10/22/2017  . Generalized weakness 05/16/2016  . Vertigo 06/27/2015  . Abnormality of gait 04/12/2015  . Constipation 08/21/2014  . Itchy scalp 08/21/2014  . Osteoarthritis 03/06/2014  . Peripheral neuropathy 03/06/2014  . Varicose veins 03/06/2014  . DDD (degenerative disc disease), lumbar 11/29/2013  . Renal cyst 11/29/2013  . OAB (overactive bladder) 08/24/2013  . Osteoporosis, unspecified   . Peripheral edema 11/15/2012  . History of rectal cancer   . Hyperlipidemia   . Dementia ([place])   . History of CVA (cerebrovascular accident)   . Hypertension   . Type 2 diabetes mellitus with diabetic chronic kidney disease [facility])     Past Surgical History:  Procedure Laterality Date  . CATARACT EXTRACTION, BILATERAL     2001  . CHOLECYSTECTOMY    . COLON SURGERY     2004  . DILATION AND CURETTAGE OF UTERUS N/A 02/17/2019   Procedure: DILATATION AND CURETTAGE;  Surgeon: Everitt Amber, MD;  Location: [facility] ORS;  Service: Gynecology;  Laterality: N/A;  . GALLBLADDER SURGERY     2001  . HERNIA REPAIR     2004  . INTRAUTERINE DEVICE (  IUD) INSERTION N/A 02/17/2019   Procedure: INTRAUTERINE DEVICE (IUD) INSERTION- MIRENA;  Surgeon: Everitt Amber, MD;  Location: [facility] ORS;  Service: Gynecology;  Laterality: N/A;  . ovary removed     2002     OB History   No obstetric history on file.     Family History  Problem Relation Age of Onset  . Cancer Mother        Pancreatic  . COPD Mother   . Dementia Father     Social History   Tobacco Use  . Smoking status: Former Research scientist (life sciences)  . Smokeless tobacco: Never Used  . Tobacco comment: quit 1970's  Substance Use Topics  . Alcohol use: No  . Drug use: No    Home Medications Prior to Admission medications    Medication Sig Start Date End Date Taking? Authorizing Provider  acetaminophen (TYLENOL) 500 MG tablet Take 1,000 mg by mouth every 8 (eight) hours as needed for mild pain.     [provider]  Blood Glucose Monitoring Suppl (BLOOD GLUCOSE SYSTEM PAK) KIT Please dispense based on patient and insurance preference. Use as directed to monitor FSBS 2x daily. Dx: E11.9. 05/03/18   Alycia Rossetti, MD  clopidogrel (PLAVIX) 75 MG tablet Take 1 tablet (75 mg total) by mouth daily. 08/08/19   Montgomery, Modena Nunnery, MD  furosemide (LASIX) 40 MG tablet Take 1 tablet (40 mg total) by mouth daily as needed. 08/08/19   Alycia Rossetti, MD  glimepiride (AMARYL) 1 MG tablet Take 2 tablets (2 mg total) by mouth daily with breakfast. 08/08/19   Alycia Rossetti, MD  JANUVIA 25 MG tablet TAKE 1 TABLET BY MOUTH  DAILY 03/14/19   Alycia Rossetti, MD  Lancets MISC Please dispense based on patient and insurance preference. Use as directed to monitor FSBS 2x daily. Dx: E11.9. 05/03/18   Alycia Rossetti, MD  levonorgestrel (MIRENA) 20 MCG/24HR IUD 1 Intra Uterine Device (1 each total) by Intrauterine route once for 1 dose. To be inserted in the OR 01/28/19 02/14/19  Cross, Lenna Sciara D, NP  loratadine (CLARITIN) 10 MG tablet Take 1 tablet (10 mg total) by mouth daily. 09/07/18   Alycia Rossetti, MD  memantine (NAMENDA XR) 14 MG CP24 24 hr capsule TAKE 1 CAPSULE BY MOUTH EVERYDAY AT BEDTIME 08/03/19   Luis M. Cintron, Modena Nunnery, MD  mirabegron ER (MYRBETRIQ) 50 MG TB24 tablet Take 1 tablet (50 mg total) by mouth daily. 11/02/18   Alycia Rossetti, MD  ondansetron (ZOFRAN ODT) 4 MG disintegrating tablet Take 1 tablet (4 mg total) by mouth every 8 (eight) hours as needed for nausea or vomiting. 08/15/19   Alycia Rossetti, MD  [facility] ULTRA test strip TEST AS DIRECTED 2 X DAILY 11/03/18   Alycia Rossetti, MD  pravastatin (PRAVACHOL) 80 MG tablet Take 1 tablet (80 mg total) by mouth daily. 08/08/19   Cameron, Modena Nunnery, MD   trimethoprim (TRIMPEX) 100 MG tablet Take 1 tablet (100 mg total) by mouth daily. Prophylaxis 11/02/18   Alycia Rossetti, MD    Allergies    Aspirin, Calcium-containing compounds, Codeine sulfate, Lipitor [atorvastatin], Zocor [simvastatin], and Zyrtec [cetirizine]  Review of Systems   Review of Systems  Gastrointestinal: Positive for nausea.  Neurological: Positive for weakness.  All other systems reviewed and are negative.   Physical Exam Updated Vital Signs BP 127/85 (BP Location: Left Arm)   Pulse 100   Temp 98.2 F (36.8 C) (Oral)   Resp 16  Ht 5' 3" (1.6 m)   Wt 114.3 kg   SpO2 96%   BMI 44.64 kg/m   Physical Exam Vitals and nursing note reviewed.  Constitutional:      Appearance: She is well-developed.     Comments: Elderly, appears weak but non-toxic  HENT:     Head: Normocephalic and atraumatic.     Mouth/Throat:     Comments: Dry mucous membranes Eyes:     Conjunctiva/sclera: Conjunctivae normal.     Pupils: Pupils are equal, round, and reactive to light.  Cardiovascular:     Rate and Rhythm: Normal rate and regular rhythm.     Heart sounds: Normal heart sounds.  Pulmonary:     Effort: Pulmonary effort is normal.     Breath sounds: Normal breath sounds. No stridor. No wheezing.  Abdominal:     General: Bowel sounds are normal.     Palpations: Abdomen is soft.     Tenderness: There is no abdominal tenderness. There is no rebound.     Comments: Obese abdomen but soft and non-tender  Musculoskeletal:        General: Normal range of motion.     Cervical back: Normal range of motion.  Skin:    General: Skin is warm and dry.  Neurological:     Mental Status: She is alert and oriented to person, place, and time.     ED Results / Procedures / Treatments   Labs (all labs ordered are listed, but only abnormal results are displayed) Labs Reviewed  BASIC METABOLIC PANEL - Abnormal; Notable for the following components:      Result Value   Glucose, Bld  305 (*)    BUN 28 (*)    Creatinine, Ser 1.33 (*)    Calcium 8.6 (*)    GFR calc non Af Amer 37 (*)    GFR calc Af Amer 43 (*)    All other components within normal limits  CBC - Abnormal; Notable for the following components:   WBC 19.0 (*)    All other components within normal limits  HEPATIC FUNCTION PANEL - Abnormal; Notable for the following components:   Albumin 2.8 (*)    AST 51 (*)    Alkaline Phosphatase 263 (*)    All other components within normal limits  CBG MONITORING, ED - Abnormal; Notable for the following components:   Glucose-Capillary 288 (*)    All other components within normal limits  CULTURE, BLOOD (ROUTINE X 2)  CULTURE, BLOOD (ROUTINE X 2)  URINE CULTURE  SARS CORONAVIRUS 2 (TAT 6-24 HRS)  LACTIC ACID, PLASMA  AMMONIA  URINALYSIS, ROUTINE W REFLEX MICROSCOPIC  TROPONIN I (HIGH SENSITIVITY)    EKG None  Radiology DG Chest 2 View  Result Date: 08/15/2019 CLINICAL DATA:  Generalized weakness and nausea, increasing confusion EXAM: CHEST - 2 VIEW COMPARISON:  06/11/2010 FINDINGS: Frontal and lateral views of the chest demonstrate an unremarkable cardiac silhouette. Numerous nodules are seen within the bilateral lungs, largest measuring 1.6 cm at the right lung base. Chest CT recommended for further evaluation. No effusion or pneumothorax. No acute fractures. IMPRESSION: 1. Bilateral pulmonary nodules, largest at the right lung base. CT chest with IV contrast recommended for further evaluation. Electronically Signed   By: Randa Ngo M.D.   On: 08/15/2019 23:25   CT Chest W Contrast  Result Date: 08/16/2019 CLINICAL DATA:  Lower abdominal pain, abnormal chest x-ray, history of colon and endometrial cancer EXAM: CT ABDOMEN AND PELVIS WITH  CONTRAST TECHNIQUE: Multidetector CT imaging of the abdomen and pelvis was performed using the standard protocol following bolus administration of intravenous contrast. CONTRAST:  79m OMNIPAQUE IOHEXOL 300 MG/ML  SOLN  COMPARISON:  Oct 22, 2017 FINDINGS: Cardiovascular: Coronary artery aortic valve and mitral valve calcifications are seen. There is scattered dense aortic atherosclerosis. There is atherosclerotic calcification at the origin of the great vessels without significant stenosis. The heart size is normal. There is no pericardial thickening or effusion. Mediastinum/Nodes: There are no enlarged mediastinal, hilar or axillary lymph nodes. The thyroid gland, trachea and esophagus demonstrate no significant findings. Lungs/Pleura: Numerous bilateral solid nodules are seen throughout both lungs the largest for example in the posterior right lung base measuring 1.9 cm. Streaky atelectasis or scarring is seen within the anterior left upper lobe. No pleural effusion or pneumothorax. For Musculoskeletal/Chest [place]: There is no chest Dunsworth mass or suspicious osseous finding. No acute osseous abnormality Abdomen/pelvis: For Hepatobiliary: Heterogeneously hypodense liver masses are seen throughout. The largest in the anterior upper right liver lobe measuring 6.9 x 5.7 cm, best seen on series 2, image 34. The main portal vein is patent. The patient is status post cholecystectomy. No biliary ductal dilation. Pancreas: Unremarkable. No pancreatic ductal dilatation or surrounding inflammatory changes. Spleen: Normal in size without focal abnormality. Adrenals/Urinary Tract: Both adrenal glands appear normal. Mild bilateral renal atrophy is noted. There is a low-density lesion again noted in lower pole of the left kidney measuring 3.9 cm. Bladder is unremarkable. Stomach/Bowel: The stomach, small bowel, and colon are normal in appearance. Scattered colonic diverticula are noted without diverticulitis. The patient has had a prior partial colonic resection with anastomosis. No inflammatory changes, Kaney thickening, or obstructive findings. Vascular/Lymphatic: No mesenteric or retroperitoneal adenopathy seen. There is a small left pelvic  sidewall lymph node measuring 9.8 mm, series 2, image 99. Scattered aortic atherosclerotic calcifications are seen without aneurysmal dilatation. Reproductive: Within the endometrial canal there is a IUD which is positioned slightly laterally and leftward. There is markedly dilated endometrial canal with fluid. Heterogeneously hypodense mass appears to be extending into the posterior uterine fundus. No definite adnexal abnormality is noted. Other: No evidence of abdominal Janowski mass or hernia. Musculoskeletal: No acute or significant osseous findings. IMPRESSION: Numerous bilateral solid pulmonary nodules, consistent with pulmonary metastatic disease. Numerous heterogeneous hepatic metastases. Dilated fluid-filled endometrial canal with a cystic mass within the uterine fundus/endometrial canal, concerning for recurrence of endometrial /uterine primary neoplasm. 9 mm left pelvic sidewall lymph node, could represent metastatic disease, would recommend attention on follow-up exam. Electronically Signed   By: BPrudencio PairM.D.   On: 08/16/2019 00:32   CT ABDOMEN PELVIS W CONTRAST  Result Date: 08/16/2019 CLINICAL DATA:  Lower abdominal pain, abnormal chest x-ray, history of colon and endometrial cancer EXAM: CT ABDOMEN AND PELVIS WITH CONTRAST TECHNIQUE: Multidetector CT imaging of the abdomen and pelvis was performed using the standard protocol following bolus administration of intravenous contrast. CONTRAST:  738mOMNIPAQUE IOHEXOL 300 MG/ML  SOLN COMPARISON:  Oct 22, 2017 FINDINGS: Cardiovascular: Coronary artery aortic valve and mitral valve calcifications are seen. There is scattered dense aortic atherosclerosis. There is atherosclerotic calcification at the origin of the great vessels without significant stenosis. The heart size is normal. There is no pericardial thickening or effusion. Mediastinum/Nodes: There are no enlarged mediastinal, hilar or axillary lymph nodes. The thyroid gland, trachea and esophagus  demonstrate no significant findings. Lungs/Pleura: Numerous bilateral solid nodules are seen throughout both lungs the largest for example in  the posterior right lung base measuring 1.9 cm. Streaky atelectasis or scarring is seen within the anterior left upper lobe. No pleural effusion or pneumothorax. For Musculoskeletal/Chest [place]: There is no chest Crandall mass or suspicious osseous finding. No acute osseous abnormality Abdomen/pelvis: For Hepatobiliary: Heterogeneously hypodense liver masses are seen throughout. The largest in the anterior upper right liver lobe measuring 6.9 x 5.7 cm, best seen on series 2, image 34. The main portal vein is patent. The patient is status post cholecystectomy. No biliary ductal dilation. Pancreas: Unremarkable. No pancreatic ductal dilatation or surrounding inflammatory changes. Spleen: Normal in size without focal abnormality. Adrenals/Urinary Tract: Both adrenal glands appear normal. Mild bilateral renal atrophy is noted. There is a low-density lesion again noted in lower pole of the left kidney measuring 3.9 cm. Bladder is unremarkable. Stomach/Bowel: The stomach, small bowel, and colon are normal in appearance. Scattered colonic diverticula are noted without diverticulitis. The patient has had a prior partial colonic resection with anastomosis. No inflammatory changes, Reidinger thickening, or obstructive findings. Vascular/Lymphatic: No mesenteric or retroperitoneal adenopathy seen. There is a small left pelvic sidewall lymph node measuring 9.8 mm, series 2, image 99. Scattered aortic atherosclerotic calcifications are seen without aneurysmal dilatation. Reproductive: Within the endometrial canal there is a IUD which is positioned slightly laterally and leftward. There is markedly dilated endometrial canal with fluid. Heterogeneously hypodense mass appears to be extending into the posterior uterine fundus. No definite adnexal abnormality is noted. Other: No evidence of abdominal  Dittmer mass or hernia. Musculoskeletal: No acute or significant osseous findings. IMPRESSION: Numerous bilateral solid pulmonary nodules, consistent with pulmonary metastatic disease. Numerous heterogeneous hepatic metastases. Dilated fluid-filled endometrial canal with a cystic mass within the uterine fundus/endometrial canal, concerning for recurrence of endometrial /uterine primary neoplasm. 9 mm left pelvic sidewall lymph node, could represent metastatic disease, would recommend attention on follow-up exam. Electronically Signed   By: Prudencio Pair M.D.   On: 08/16/2019 00:32    Procedures Procedures (including critical care time)  Medications Ordered in ED Medications  sodium chloride (PF) 0.9 % injection (has no administration in time range)  ondansetron (ZOFRAN) injection 4 mg (4 mg Intravenous Given 08/15/19 2304)  sodium chloride 0.9 % bolus 500 mL (500 mLs Intravenous New Bag/Given 08/15/19 2306)  iohexol (OMNIPAQUE) 300 MG/ML solution 100 mL (75 mLs Intravenous Contrast Given 08/15/19 2331)    ED Course  I have reviewed the triage vital signs and the nursing notes.  Pertinent labs & imaging results that were available during my care of the patient were reviewed by me and considered in my medical decision making (see chart for details).    MDM Rules/Calculators/A&P  86 year old female presenting to the ED from PCP office with abnormal labs and generalized weakness.  Sounds like she has been declining for quite some time but much worse over the past week and a half.  She is not eating and drinking, barely getting up and moving around due to weakness.  Had leukocytosis, abnormal LFTs, and alk phos at PCP today.  Has had some intermittent abdominal pain but denies this currently.  Patient is elderly but afebrile and nontoxic.  She is in no acute distress.  Her abdomen is obese but is overall soft and benign here.  Labs appear very similar to earlier today without any significant change.  Will  add on chest x-ray, ammonia, lactic acid, blood and urine cultures, along with abdominal CT.  Given IVFB and zofran.  Chest x-ray has come back with  what appears to be multiple mets.  Will add on CT of the chest.  Normal lactate and ammonia.  Awaiting UA.  CTs have come back with unfortunate findings of diffuse liver and pulmonary mets.  Results have been discussed with patient, she is clearly sad as expected, but does want to know what her options are.  Will discuss with her daughter for goals of care.  Nausea is somewhat better here, no vomiting observed.  Patient is resting comfortably.  1:02 AM Have spoken with patient's daughter, [name]-- she has been made aware of unfortunate CT findings.  States patient has been very weak, still able to get up and go to the bathroom on her own but is not able to do much more than that.  States she has not eaten a meal in well over a week.  We have discussed options, as it appears she was not a candidate for any aggressive treatment with her endometrial cancer due to her comorbidities.  Daughter would like to discuss with oncology her options for any possible treatment or palliative care that she can receive which seems reasonable.  As patient is in a failure to thrive state currently with some signs of dehydration here and clinically, I feel it would be reasonable to admit and have oncology see her in the morning.  Patient and daughter were both comfortable with this option.  Discussed with hospitalist, Dr. Marlowe Sax-- will admit for ongoing care.  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

## 2019-08-15 NOTE — Patient Instructions (Addendum)
We will call with results Give her the nausea medication F/U pending results

## 2019-08-16 ENCOUNTER — Encounter (HOSPITAL_COMMUNITY): Payer: Self-pay | Admitting: Internal Medicine

## 2019-08-16 ENCOUNTER — Observation Stay (HOSPITAL_COMMUNITY): Payer: Medicare HMO

## 2019-08-16 ENCOUNTER — Encounter: Payer: Self-pay | Admitting: Oncology

## 2019-08-16 DIAGNOSIS — C541 Malignant neoplasm of endometrium: Secondary | ICD-10-CM | POA: Diagnosis present

## 2019-08-16 DIAGNOSIS — Z886 Allergy status to analgesic agent status: Secondary | ICD-10-CM | POA: Diagnosis not present

## 2019-08-16 DIAGNOSIS — E559 Vitamin D deficiency, unspecified: Secondary | ICD-10-CM | POA: Diagnosis present

## 2019-08-16 DIAGNOSIS — E1122 Type 2 diabetes mellitus with diabetic chronic kidney disease: Secondary | ICD-10-CM | POA: Diagnosis present

## 2019-08-16 DIAGNOSIS — C801 Malignant (primary) neoplasm, unspecified: Secondary | ICD-10-CM | POA: Diagnosis not present

## 2019-08-16 DIAGNOSIS — C799 Secondary malignant neoplasm of unspecified site: Secondary | ICD-10-CM | POA: Diagnosis not present

## 2019-08-16 DIAGNOSIS — R269 Unspecified abnormalities of gait and mobility: Secondary | ICD-10-CM | POA: Diagnosis not present

## 2019-08-16 DIAGNOSIS — Z7984 Long term (current) use of oral hypoglycemic drugs: Secondary | ICD-10-CM | POA: Diagnosis not present

## 2019-08-16 DIAGNOSIS — G9340 Encephalopathy, unspecified: Secondary | ICD-10-CM | POA: Diagnosis not present

## 2019-08-16 DIAGNOSIS — N183 Chronic kidney disease, stage 3 unspecified: Secondary | ICD-10-CM | POA: Diagnosis present

## 2019-08-16 DIAGNOSIS — Z825 Family history of asthma and other chronic lower respiratory diseases: Secondary | ICD-10-CM | POA: Diagnosis not present

## 2019-08-16 DIAGNOSIS — F0150 Vascular dementia without behavioral disturbance: Secondary | ICD-10-CM | POA: Diagnosis not present

## 2019-08-16 DIAGNOSIS — Z87891 Personal history of nicotine dependence: Secondary | ICD-10-CM | POA: Diagnosis not present

## 2019-08-16 DIAGNOSIS — Z975 Presence of (intrauterine) contraceptive device: Secondary | ICD-10-CM | POA: Diagnosis not present

## 2019-08-16 DIAGNOSIS — Z6841 Body Mass Index (BMI) 40.0 and over, adult: Secondary | ICD-10-CM | POA: Diagnosis not present

## 2019-08-16 DIAGNOSIS — Z66 Do not resuscitate: Secondary | ICD-10-CM | POA: Diagnosis not present

## 2019-08-16 DIAGNOSIS — E785 Hyperlipidemia, unspecified: Secondary | ICD-10-CM | POA: Diagnosis present

## 2019-08-16 DIAGNOSIS — M5136 Other intervertebral disc degeneration, lumbar region: Secondary | ICD-10-CM | POA: Diagnosis present

## 2019-08-16 DIAGNOSIS — G9341 Metabolic encephalopathy: Secondary | ICD-10-CM | POA: Diagnosis present

## 2019-08-16 DIAGNOSIS — D72829 Elevated white blood cell count, unspecified: Secondary | ICD-10-CM

## 2019-08-16 DIAGNOSIS — Z8673 Personal history of transient ischemic attack (TIA), and cerebral infarction without residual deficits: Secondary | ICD-10-CM | POA: Diagnosis not present

## 2019-08-16 DIAGNOSIS — Z515 Encounter for palliative care: Secondary | ICD-10-CM | POA: Diagnosis not present

## 2019-08-16 DIAGNOSIS — C7800 Secondary malignant neoplasm of unspecified lung: Secondary | ICD-10-CM | POA: Diagnosis present

## 2019-08-16 DIAGNOSIS — R110 Nausea: Secondary | ICD-10-CM | POA: Diagnosis present

## 2019-08-16 DIAGNOSIS — Z20822 Contact with and (suspected) exposure to covid-19: Secondary | ICD-10-CM | POA: Diagnosis present

## 2019-08-16 DIAGNOSIS — C787 Secondary malignant neoplasm of liver and intrahepatic bile duct: Secondary | ICD-10-CM | POA: Diagnosis present

## 2019-08-16 DIAGNOSIS — M810 Age-related osteoporosis without current pathological fracture: Secondary | ICD-10-CM | POA: Diagnosis present

## 2019-08-16 DIAGNOSIS — I129 Hypertensive chronic kidney disease with stage 1 through stage 4 chronic kidney disease, or unspecified chronic kidney disease: Secondary | ICD-10-CM | POA: Diagnosis present

## 2019-08-16 DIAGNOSIS — R6510 Systemic inflammatory response syndrome (SIRS) of non-infectious origin without acute organ dysfunction: Secondary | ICD-10-CM | POA: Diagnosis present

## 2019-08-16 DIAGNOSIS — I4510 Unspecified right bundle-branch block: Secondary | ICD-10-CM | POA: Diagnosis present

## 2019-08-16 DIAGNOSIS — Z7902 Long term (current) use of antithrombotics/antiplatelets: Secondary | ICD-10-CM | POA: Diagnosis not present

## 2019-08-16 DIAGNOSIS — Z7189 Other specified counseling: Secondary | ICD-10-CM | POA: Diagnosis not present

## 2019-08-16 DIAGNOSIS — Z800 Family history of malignant neoplasm of digestive organs: Secondary | ICD-10-CM | POA: Diagnosis not present

## 2019-08-16 LAB — URINALYSIS, ROUTINE W REFLEX MICROSCOPIC
Bilirubin Urine: NEGATIVE
Glucose, UA: NEGATIVE mg/dL
Hgb urine dipstick: NEGATIVE
Ketones, ur: NEGATIVE mg/dL
Leukocytes,Ua: NEGATIVE
Nitrite: NEGATIVE
Protein, ur: NEGATIVE mg/dL
Specific Gravity, Urine: 1.038 — ABNORMAL HIGH (ref 1.005–1.030)
pH: 5 (ref 5.0–8.0)

## 2019-08-16 LAB — CBC WITH DIFFERENTIAL/PLATELET
Abs Immature Granulocytes: 0.18 10*3/uL — ABNORMAL HIGH (ref 0.00–0.07)
Basophils Absolute: 0 10*3/uL (ref 0.0–0.1)
Basophils Relative: 0 %
Eosinophils Absolute: 0 10*3/uL (ref 0.0–0.5)
Eosinophils Relative: 0 %
HCT: 39.4 % (ref 36.0–46.0)
Hemoglobin: 11.9 g/dL — ABNORMAL LOW (ref 12.0–15.0)
Immature Granulocytes: 1 %
Lymphocytes Relative: 6 %
Lymphs Abs: 1 10*3/uL (ref 0.7–4.0)
MCH: 28.7 pg (ref 26.0–34.0)
MCHC: 30.2 g/dL (ref 30.0–36.0)
MCV: 94.9 fL (ref 80.0–100.0)
Monocytes Absolute: 1.3 10*3/uL — ABNORMAL HIGH (ref 0.1–1.0)
Monocytes Relative: 8 %
Neutro Abs: 13.8 10*3/uL — ABNORMAL HIGH (ref 1.7–7.7)
Neutrophils Relative %: 85 %
Platelets: 213 10*3/uL (ref 150–400)
RBC: 4.15 MIL/uL (ref 3.87–5.11)
RDW: 14.2 % (ref 11.5–15.5)
WBC: 16.4 10*3/uL — ABNORMAL HIGH (ref 4.0–10.5)
nRBC: 0 % (ref 0.0–0.2)

## 2019-08-16 LAB — SARS CORONAVIRUS 2 (TAT 6-24 HRS): SARS Coronavirus 2: NEGATIVE

## 2019-08-16 LAB — SARS-COV-2 RNA,(COVID-19) QUALITATIVE NAAT: SARS CoV2 RNA: NOT DETECTED

## 2019-08-16 LAB — GLUCOSE, CAPILLARY
Glucose-Capillary: 216 mg/dL — ABNORMAL HIGH (ref 70–99)
Glucose-Capillary: 247 mg/dL — ABNORMAL HIGH (ref 70–99)
Glucose-Capillary: 168 mg/dL — ABNORMAL HIGH (ref 70–99)
Glucose-Capillary: 143 mg/dL — ABNORMAL HIGH (ref 70–99)

## 2019-08-16 LAB — MAGNESIUM: Magnesium: 2.2 mg/dL (ref 1.7–2.4)

## 2019-08-16 LAB — HEMOGLOBIN A1C
Hgb A1c MFr Bld: 10.6 % — ABNORMAL HIGH (ref 4.8–5.6)
Mean Plasma Glucose: 257.52 mg/dL

## 2019-08-16 LAB — PROCALCITONIN: Procalcitonin: 1.94 ng/mL

## 2019-08-16 MED ORDER — MEMANTINE HCL ER 14 MG PO CP24
14.0000 mg | ORAL_CAPSULE | Freq: Every day | ORAL | Status: DC
  Administered 2019-08-16 – 2019-08-21 (×6): 14 mg via ORAL
  Filled 2019-08-16 (×7): qty 1

## 2019-08-16 MED ORDER — PRO-STAT SUGAR FREE PO LIQD
30.0000 mL | Freq: Two times a day (BID) | ORAL | Status: DC
  Administered 2019-08-16 – 2019-08-21 (×7): 30 mL via ORAL
  Filled 2019-08-16 (×9): qty 30

## 2019-08-16 MED ORDER — INSULIN ASPART 100 UNIT/ML ~~LOC~~ SOLN
0.0000 [IU] | Freq: Three times a day (TID) | SUBCUTANEOUS | Status: DC
  Administered 2019-08-16 (×2): 3 [IU] via SUBCUTANEOUS
  Administered 2019-08-16: 2 [IU] via SUBCUTANEOUS
  Administered 2019-08-17: 3 [IU] via SUBCUTANEOUS
  Administered 2019-08-17: 2 [IU] via SUBCUTANEOUS
  Administered 2019-08-17: 3 [IU] via SUBCUTANEOUS
  Administered 2019-08-18: 2 [IU] via SUBCUTANEOUS
  Administered 2019-08-18: 5 [IU] via SUBCUTANEOUS
  Administered 2019-08-18 – 2019-08-19 (×3): 2 [IU] via SUBCUTANEOUS
  Administered 2019-08-19: 3 [IU] via SUBCUTANEOUS
  Administered 2019-08-20: 2 [IU] via SUBCUTANEOUS
  Administered 2019-08-20: 3 [IU] via SUBCUTANEOUS
  Administered 2019-08-20 – 2019-08-21 (×2): 2 [IU] via SUBCUTANEOUS
  Administered 2019-08-21: 3 [IU] via SUBCUTANEOUS
  Administered 2019-08-21: 1 [IU] via SUBCUTANEOUS
  Administered 2019-08-22: 2 [IU] via SUBCUTANEOUS
  Filled 2019-08-16: qty 0.09

## 2019-08-16 MED ORDER — VANCOMYCIN HCL 2000 MG/400ML IV SOLN
2000.0000 mg | Freq: Once | INTRAVENOUS | Status: AC
  Administered 2019-08-16: 2000 mg via INTRAVENOUS
  Filled 2019-08-16: qty 400

## 2019-08-16 MED ORDER — ADULT MULTIVITAMIN W/MINERALS CH
1.0000 | ORAL_TABLET | Freq: Every day | ORAL | Status: DC
  Administered 2019-08-16 – 2019-08-22 (×7): 1 via ORAL
  Filled 2019-08-16 (×7): qty 1

## 2019-08-16 MED ORDER — SODIUM CHLORIDE 0.9 % IV SOLN
2.0000 g | Freq: Two times a day (BID) | INTRAVENOUS | Status: DC
  Administered 2019-08-16 – 2019-08-21 (×11): 2 g via INTRAVENOUS
  Filled 2019-08-16 (×12): qty 2

## 2019-08-16 MED ORDER — INSULIN ASPART 100 UNIT/ML ~~LOC~~ SOLN
0.0000 [IU] | Freq: Every day | SUBCUTANEOUS | Status: DC
  Administered 2019-08-17 – 2019-08-20 (×3): 2 [IU] via SUBCUTANEOUS
  Filled 2019-08-16: qty 0.05

## 2019-08-16 MED ORDER — ENSURE ENLIVE PO LIQD
237.0000 mL | ORAL | Status: DC
  Administered 2019-08-17 – 2019-08-22 (×6): 237 mL via ORAL

## 2019-08-16 MED ORDER — HEPARIN SODIUM (PORCINE) 5000 UNIT/ML IJ SOLN: 5000.0000 [IU] | Freq: Three times a day (TID) | INTRAMUSCULAR | Status: DC

## 2019-08-16 MED ORDER — ACETAMINOPHEN 325 MG PO TABS
650.0000 mg | ORAL_TABLET | Freq: Four times a day (QID) | ORAL | Status: DC | PRN
  Administered 2019-08-21: 650 mg via ORAL
  Filled 2019-08-16: qty 2

## 2019-08-16 MED ORDER — CLOPIDOGREL BISULFATE 75 MG PO TABS: 75.0000 mg | ORAL_TABLET | Freq: Every day | ORAL | Status: DC

## 2019-08-16 MED ORDER — SODIUM CHLORIDE 0.9 % IV BOLUS
500.0000 mL | Freq: Once | INTRAVENOUS | Status: AC
  Administered 2019-08-16: 500 mL via INTRAVENOUS

## 2019-08-16 MED ORDER — ACETAMINOPHEN 650 MG RE SUPP: 650.0000 mg | Freq: Four times a day (QID) | RECTAL | Status: DC | PRN

## 2019-08-16 MED ORDER — PRAVASTATIN SODIUM 40 MG PO TABS
80.0000 mg | ORAL_TABLET | Freq: Every day | ORAL | Status: DC
  Administered 2019-08-16 – 2019-08-21 (×6): 80 mg via ORAL
  Filled 2019-08-16 (×6): qty 2

## 2019-08-16 MED ORDER — ENOXAPARIN SODIUM 40 MG/0.4ML ~~LOC~~ SOLN
40.0000 mg | SUBCUTANEOUS | Status: DC
  Administered 2019-08-16: 40 mg via SUBCUTANEOUS
  Filled 2019-08-16: qty 0.4

## 2019-08-16 MED ORDER — MIRABEGRON ER 25 MG PO TB24
50.0000 mg | ORAL_TABLET | Freq: Every day | ORAL | Status: DC
  Administered 2019-08-16 – 2019-08-22 (×7): 50 mg via ORAL
  Filled 2019-08-16 (×7): qty 2

## 2019-08-16 MED ORDER — POTASSIUM CHLORIDE CRYS ER 20 MEQ PO TBCR
20.0000 meq | EXTENDED_RELEASE_TABLET | Freq: Once | ORAL | Status: AC
  Administered 2019-08-16: 20 meq via ORAL
  Filled 2019-08-16: qty 1

## 2019-08-16 MED ORDER — VANCOMYCIN HCL 2000 MG/400ML IV SOLN
2000.0000 mg | Freq: Once | INTRAVENOUS | Status: DC
  Filled 2019-08-16: qty 400

## 2019-08-16 MED ORDER — VANCOMYCIN HCL 750 MG/150ML IV SOLN
750.0000 mg | INTRAVENOUS | Status: DC
  Administered 2019-08-17 – 2019-08-18 (×2): 750 mg via INTRAVENOUS
  Filled 2019-08-16 (×2): qty 150

## 2019-08-16 MED ORDER — SODIUM CHLORIDE 0.9 % IV SOLN: INTRAVENOUS | Status: AC

## 2019-08-16 NOTE — Progress Notes (Signed)
Pharmacy Antibiotic Note  Christine Zavala is a 86 y.o. female admitted on 08/15/2019 with sepsis.  Pharmacy has been consulted for Vancomycin, cefepime dosing.  Plan: Vancomycin 2gm iv x1, then Vancomycin 750 mg IV Q 24 hrs. Goal AUC 400-550. Expected AUC: 498 SCr used: 1.33  Cefepime 2gm iv x1, then 2gm iv q12hr   Height: 5\' 3"  (160 cm) Weight: 252 lb (114.3 kg) IBW/kg (Calculated) : 52.4  Temp (24hrs), Avg:98.6 F (37 C), Min:98.2 F (36.8 C), Max:98.9 F (37.2 C)  Recent Labs  Lab 08/15/19 1256 08/15/19 1853 08/15/19 2223  WBC 16.8* 19.0*  --   CREATININE 1.22* 1.33*  --   LATICACIDVEN  --   --  1.7    Estimated Creatinine Clearance: 39.1 mL/min (A) (by C-G formula based on SCr of 1.33 mg/dL (H)).    Allergies  Allergen Reactions  . Aspirin Nausea And Vomiting  . Calcium-Containing Compounds     Legs burn  . Codeine Sulfate Nausea And Vomiting  . Lipitor [Atorvastatin]     myalgias  . Zocor [Simvastatin]     pruritis  . Zyrtec [Cetirizine]     Antimicrobials this admission: Vancomycin 08/16/2019 >> Cefepime 08/16/2019 >>   Dose adjustments this admission: -  Microbiology results: -  Thank you for allowing pharmacy to be a part of this patient's care.  Nani Skillern Crowford 08/16/2019 4:53 AM

## 2019-08-16 NOTE — Progress Notes (Signed)
Patient seen and examined at bedside, patient admitted after midnight, please see earlier detailed admission note by Shela Leff, MD. Briefly, patient presented secondary to altered mental status, generalized weakness, abdominal pain. She met SIRS criteria on admission and was started on empiric antibiotics  General exam: Appears calm and comfortable  Respiratory system: Diminished. Respiratory effort normal. Cardiovascular system: S1 & S2 heard, RRR. No murmurs, rubs, gallops or clicks. Gastrointestinal system: Abdomen is nondistended, soft and nontender. No organomegaly or masses felt. Normal bowel sounds heard. Central nervous system: Alert and oriented to person and place. Extremities:  No calf tenderness Skin: No cyanosis. No rashes Psychiatry: Judgement and insight appear impaired. Mood & affect appropriate.   SIRS Unknown source. Improving clinically with antibiotics and fluids -Continue Vancomycin and Cefepime -Blood/urine cultures pending  Concern for recurrent endometrial cancer with concern for metastasis -Gynecology oncology consulted for evaluation  Metabolic encephalopathy Per family, improved.  Dementia Noted. Family confirmed.  Generalized weakness PT recommending home health. OT pending  Daughter called and informed of plan for today. Rest of plan per H&P  Cordelia Poche, MD [facility] 08/16/2019, 2:24 PM

## 2019-08-16 NOTE — H&P (Signed)
History and Physical    Christine Zavala QQP:[id number] DOB: 1933-07-20 DOA: 08/15/2019  PCP: Alycia Rossetti, MD Patient coming from: Home  Chief Complaint: Altered mental status, generalized weakness, abnormal labs  HPI: Christine Zavala is a 86 y.o. female with medical history significant of dementia, endometrial cancer, colon cancer status post left colectomy, diabetes, DVT, hypertension, hyperlipidemia, CVA presenting to the ED for evaluation of altered mental status, generalized weakness, and abnormal labs.  Patient was sent to the ED by her PCP for evaluation of leukocytosis and abnormal liver enzymes.  Patient is currently confused and does not know why she is here.  Complaining of slight cough and shortness of breath.  She has no other complaints.  Denies fevers, chills, nausea, vomiting, abdominal pain, chest pain, or dysuria.  ED Course: Afebrile.  Slightly tachycardic.  Not hypoxic.  WBC count 19.  Lactic acid normal.  Blood glucose 305.  Creatinine 1.3, at baseline.  AST mildly elevated at 51, alk phos 263.  ALT and T bili normal.  UA and urine culture pending.  Ammonia level normal.  High-sensitivity troponin negative.  Blood culture x2 pending.  SARS-CoV-2 PCR test pending. Chest x-ray showing bilateral pulmonary nodules, largest at the right lung base. Chest CT showing numerous bilateral solid pulmonary nodules consistent with pulmonary metastatic disease. CT abdomen pelvis showing numerous heterogenous hepatic metastases.  Dilated fluid-filled endometrial canal with a cystic mass within the uterine fundus/endometrial canal concerning for recurrence of endometrial/uterine primary neoplasm.  9 mm left pelvic sidewall lymph node, could represent metastatic disease. Patient received Zofran and 500 cc normal saline bolus.  Review of Systems:  All systems reviewed and apart from history of presenting illness, are negative.  Past Medical History:  Diagnosis Date  . Cancer (HCC)    Rectal  .  Cataract   . Dementia ([place])   . Diabetes mellitus without complication ([place])   . DVT (deep venous thrombosis) ([place])   . H/O vitamin D deficiency   . High cholesterol   . Hyperlipidemia   . Hypertension   . Osteoporosis   . Stroke [facility])     Past Surgical History:  Procedure Laterality Date  . CATARACT EXTRACTION, BILATERAL     2001  . CHOLECYSTECTOMY    . COLON SURGERY     2004  . DILATION AND CURETTAGE OF UTERUS N/A 02/17/2019   Procedure: DILATATION AND CURETTAGE;  Surgeon: Everitt Amber, MD;  Location: [facility];  Service: Gynecology;  Laterality: N/A;  . GALLBLADDER SURGERY     2001  . HERNIA REPAIR     2004  . INTRAUTERINE DEVICE (IUD) INSERTION N/A 02/17/2019   Procedure: INTRAUTERINE DEVICE (IUD) INSERTION- MIRENA;  Surgeon: Everitt Amber, MD;  Location: [facility];  Service: Gynecology;  Laterality: N/A;  . ovary removed     2002     reports that she has quit smoking. She has never used smokeless tobacco. She reports that she does not drink alcohol or use drugs.  Allergies  Allergen Reactions  . Aspirin Nausea And Vomiting  . Calcium-Containing Compounds     Legs burn  . Codeine Sulfate Nausea And Vomiting  . Lipitor [Atorvastatin]     myalgias  . Zocor [Simvastatin]     pruritis  . Zyrtec [Cetirizine]     Family History  Problem Relation Age of Onset  . Cancer Mother        Pancreatic  . COPD Mother   . Dementia Father     Prior to  Admission medications   Medication Sig Start Date End Date Taking? Authorizing Provider  acetaminophen (TYLENOL) 500 MG tablet Take 1,000 mg by mouth every 8 (eight) hours as needed for mild pain.    Yes [provider]  clopidogrel (PLAVIX) 75 MG tablet Take 1 tablet (75 mg total) by mouth daily. 08/08/19  Yes [place], Modena Nunnery, MD  furosemide (LASIX) 40 MG tablet Take 1 tablet (40 mg total) by mouth daily as needed. Patient taking differently: Take 40 mg by mouth daily as needed for fluid.  08/08/19  Yes [place], Modena Nunnery, MD    glimepiride (AMARYL) 1 MG tablet Take 2 tablets (2 mg total) by mouth daily with breakfast. 08/08/19  Yes [place], Modena Nunnery, MD  JANUVIA 25 MG tablet TAKE 1 TABLET BY MOUTH  DAILY Patient taking differently: Take 25 mg by mouth daily.  03/14/19  Yes [place], Modena Nunnery, MD  memantine (NAMENDA XR) 14 MG CP24 24 hr capsule TAKE 1 CAPSULE BY MOUTH EVERYDAY AT BEDTIME Patient taking differently: Take 14 mg by mouth at bedtime.  08/03/19  Yes [place], Modena Nunnery, MD  mirabegron ER (MYRBETRIQ) 50 MG TB24 tablet Take 1 tablet (50 mg total) by mouth daily. 11/02/18  Yes [place], Modena Nunnery, MD  ondansetron (ZOFRAN ODT) 4 MG disintegrating tablet Take 1 tablet (4 mg total) by mouth every 8 (eight) hours as needed for nausea or vomiting. 08/15/19  Yes [place], Modena Nunnery, MD  pravastatin (PRAVACHOL) 80 MG tablet Take 1 tablet (80 mg total) by mouth daily. 08/08/19  Yes [place], Modena Nunnery, MD  trimethoprim (TRIMPEX) 100 MG tablet Take 1 tablet (100 mg total) by mouth daily. Prophylaxis 11/02/18  Yes , Modena Nunnery, MD  Blood Glucose Monitoring Suppl (BLOOD GLUCOSE SYSTEM PAK) KIT Please dispense based on patient and insurance preference. Use as directed to monitor FSBS 2x daily. Dx: E11.9. 05/03/18   Alycia Rossetti, MD  Lancets MISC Please dispense based on patient and insurance preference. Use as directed to monitor FSBS 2x daily. Dx: E11.9. 05/03/18   Alycia Rossetti, MD  levonorgestrel (MIRENA) 20 MCG/24HR IUD 1 Intra Uterine Device (1 each total) by Intrauterine route once for 1 dose. To be inserted in the OR 01/28/19 02/14/19  Cross, Lenna Sciara D, NP  loratadine (CLARITIN) 10 MG tablet Take 1 tablet (10 mg total) by mouth daily. Patient not taking: Reported on 08/15/2019 09/07/18   Alycia Rossetti, MD  [facility] ULTRA test strip TEST AS DIRECTED 2 X DAILY 11/03/18   Alycia Rossetti, MD    Physical Exam: Vitals:   08/16/19 0010 08/16/19 0015 08/16/19 0235 08/16/19 0345  BP: 131/89 (!) 122/100 134/79 128/71  Pulse: 99  (!) 103 (!) 121 (!) 122  Resp: 16 (!) 28 19 (!) 23  Temp:      TempSrc:      SpO2: 97% 95% 97% 96%  Weight:      Height:        Physical Exam  Constitutional: She appears well-developed and well-nourished. No distress.  HENT:  Head: Normocephalic.  Eyes: Right eye exhibits no discharge. Left eye exhibits no discharge.  Cardiovascular: Normal rate, regular rhythm and intact distal pulses.  Pulmonary/Chest: Effort normal and breath sounds normal. No respiratory distress. She has no wheezes. She has no rales.  Abdominal: Soft. Bowel sounds are normal. She exhibits no distension. There is no abdominal tenderness. There is no guarding.  Musculoskeletal:        General: Edema present.  Cervical back: Neck supple.     Comments: +1 pedal edema  Neurological:  Awake and alert Oriented to person and place only Moving all extremities spontaneously.  No focal motor or sensory deficit.  Skin: Skin is warm and dry. She is not diaphoretic.     Labs on Admission: I have personally reviewed following labs and imaging studies  CBC: Recent Labs  Lab 08/15/19 1256 08/15/19 1853  WBC 16.8* 19.0*  NEUTROABS 14,851*  --   HGB 12.6 12.4  HCT 38.5 40.0  MCV 90.2 93.5  PLT 239 175   Basic Metabolic Panel: Recent Labs  Lab 08/15/19 1256 08/15/19 1853  NA 134* 135  K 3.8 3.8  CL 97* 100  CO2 22 22  GLUCOSE 283* 305*  BUN 23 28*  CREATININE 1.22* 1.33*  CALCIUM 8.6 8.6*   GFR: Estimated Creatinine Clearance: 39.1 mL/min (A) (by C-G formula based on SCr of 1.33 mg/dL (H)). Liver Function Tests: Recent Labs  Lab 08/15/19 1256 08/15/19 1853  AST 52* 51*  ALT 26 31  ALKPHOS  --  263*  BILITOT 0.9 0.8  PROT 6.0* 6.7  ALBUMIN  --  2.8*   Recent Labs  Lab 08/15/19 1256  LIPASE 11   Recent Labs  Lab 08/15/19 2223  AMMONIA 13   Coagulation Profile: No results for input(s): INR, PROTIME in the last 168 hours. Cardiac Enzymes: No results for input(s): CKTOTAL, CKMB,  CKMBINDEX, TROPONINI in the last 168 hours. BNP (last 3 results) No results for input(s): PROBNP in the last 8760 hours. HbA1C: No results for input(s): HGBA1C in the last 72 hours. CBG: Recent Labs  Lab 08/15/19 1813  GLUCAP 288*   Lipid Profile: No results for input(s): CHOL, HDL, LDLCALC, TRIG, CHOLHDL, LDLDIRECT in the last 72 hours. Thyroid Function Tests: No results for input(s): TSH, T4TOTAL, FREET4, T3FREE, THYROIDAB in the last 72 hours. Anemia Panel: No results for input(s): VITAMINB12, FOLATE, FERRITIN, TIBC, IRON, RETICCTPCT in the last 72 hours. Urine analysis:    Component Value Date/Time   COLORURINE YELLOW 08/15/2019 1312   APPEARANCEUR CLEAR 08/15/2019 1312   LABSPEC 1.020 08/15/2019 1312   PHURINE 5.5 08/15/2019 1312   GLUCOSEU NEGATIVE 08/15/2019 1312   HGBUR NEGATIVE 08/15/2019 1312   BILIRUBINUR NEGATIVE 02/16/2019 1228   KETONESUR NEGATIVE 08/15/2019 1312   PROTEINUR 1+ (A) 08/15/2019 1312   UROBILINOGEN 0.2 03/06/2014 0936   NITRITE NEGATIVE 08/15/2019 1312   LEUKOCYTESUR NEGATIVE 08/15/2019 1312    Radiological Exams on Admission: DG Chest 2 View  Result Date: 08/15/2019 CLINICAL DATA:  Generalized weakness and nausea, increasing confusion EXAM: CHEST - 2 VIEW COMPARISON:  06/11/2010 FINDINGS: Frontal and lateral views of the chest demonstrate an unremarkable cardiac silhouette. Numerous nodules are seen within the bilateral lungs, largest measuring 1.6 cm at the right lung base. Chest CT recommended for further evaluation. No effusion or pneumothorax. No acute fractures. IMPRESSION: 1. Bilateral pulmonary nodules, largest at the right lung base. CT chest with IV contrast recommended for further evaluation. Electronically Signed   By: Randa Ngo M.D.   On: 08/15/2019 23:25   CT Chest W Contrast  Result Date: 08/16/2019 CLINICAL DATA:  Lower abdominal pain, abnormal chest x-ray, history of colon and endometrial cancer EXAM: CT ABDOMEN AND PELVIS  WITH CONTRAST TECHNIQUE: Multidetector CT imaging of the abdomen and pelvis was performed using the standard protocol following bolus administration of intravenous contrast. CONTRAST:  91m OMNIPAQUE IOHEXOL 300 MG/ML  SOLN COMPARISON:  Oct 22, 2017  FINDINGS: Cardiovascular: Coronary artery aortic valve and mitral valve calcifications are seen. There is scattered dense aortic atherosclerosis. There is atherosclerotic calcification at the origin of the great vessels without significant stenosis. The heart size is normal. There is no pericardial thickening or effusion. Mediastinum/Nodes: There are no enlarged mediastinal, hilar or axillary lymph nodes. The thyroid gland, trachea and esophagus demonstrate no significant findings. Lungs/Pleura: Numerous bilateral solid nodules are seen throughout both lungs the largest for example in the posterior right lung base measuring 1.9 cm. Streaky atelectasis or scarring is seen within the anterior left upper lobe. No pleural effusion or pneumothorax. For Musculoskeletal/Chest Cancro: There is no chest Sessler mass or suspicious osseous finding. No acute osseous abnormality Abdomen/pelvis: For Hepatobiliary: Heterogeneously hypodense liver masses are seen throughout. The largest in the anterior upper right liver lobe measuring 6.9 x 5.7 cm, best seen on series 2, image 34. The main portal vein is patent. The patient is status post cholecystectomy. No biliary ductal dilation. Pancreas: Unremarkable. No pancreatic ductal dilatation or surrounding inflammatory changes. Spleen: Normal in size without focal abnormality. Adrenals/Urinary Tract: Both adrenal glands appear normal. Mild bilateral renal atrophy is noted. There is a low-density lesion again noted in lower pole of the left kidney measuring 3.9 cm. Bladder is unremarkable. Stomach/Bowel: The stomach, small bowel, and colon are normal in appearance. Scattered colonic diverticula are noted without diverticulitis. The patient has  had a prior partial colonic resection with anastomosis. No inflammatory changes, Bloom thickening, or obstructive findings. Vascular/Lymphatic: No mesenteric or retroperitoneal adenopathy seen. There is a small left pelvic sidewall lymph node measuring 9.8 mm, series 2, image 99. Scattered aortic atherosclerotic calcifications are seen without aneurysmal dilatation. Reproductive: Within the endometrial canal there is a IUD which is positioned slightly laterally and leftward. There is markedly dilated endometrial canal with fluid. Heterogeneously hypodense mass appears to be extending into the posterior uterine fundus. No definite adnexal abnormality is noted. Other: No evidence of abdominal Spainhower mass or hernia. Musculoskeletal: No acute or significant osseous findings. IMPRESSION: Numerous bilateral solid pulmonary nodules, consistent with pulmonary metastatic disease. Numerous heterogeneous hepatic metastases. Dilated fluid-filled endometrial canal with a cystic mass within the uterine fundus/endometrial canal, concerning for recurrence of endometrial /uterine primary neoplasm. 9 mm left pelvic sidewall lymph node, could represent metastatic disease, would recommend attention on follow-up exam. Electronically Signed   By: Prudencio Pair M.D.   On: 08/16/2019 00:32   CT ABDOMEN PELVIS W CONTRAST  Result Date: 08/16/2019 CLINICAL DATA:  Lower abdominal pain, abnormal chest x-ray, history of colon and endometrial cancer EXAM: CT ABDOMEN AND PELVIS WITH CONTRAST TECHNIQUE: Multidetector CT imaging of the abdomen and pelvis was performed using the standard protocol following bolus administration of intravenous contrast. CONTRAST:  44m OMNIPAQUE IOHEXOL 300 MG/ML  SOLN COMPARISON:  Oct 22, 2017 FINDINGS: Cardiovascular: Coronary artery aortic valve and mitral valve calcifications are seen. There is scattered dense aortic atherosclerosis. There is atherosclerotic calcification at the origin of the great vessels without  significant stenosis. The heart size is normal. There is no pericardial thickening or effusion. Mediastinum/Nodes: There are no enlarged mediastinal, hilar or axillary lymph nodes. The thyroid gland, trachea and esophagus demonstrate no significant findings. Lungs/Pleura: Numerous bilateral solid nodules are seen throughout both lungs the largest for example in the posterior right lung base measuring 1.9 cm. Streaky atelectasis or scarring is seen within the anterior left upper lobe. No pleural effusion or pneumothorax. For Musculoskeletal/Chest Stemler: There is no chest Grill mass or suspicious osseous  finding. No acute osseous abnormality Abdomen/pelvis: For Hepatobiliary: Heterogeneously hypodense liver masses are seen throughout. The largest in the anterior upper right liver lobe measuring 6.9 x 5.7 cm, best seen on series 2, image 34. The main portal vein is patent. The patient is status post cholecystectomy. No biliary ductal dilation. Pancreas: Unremarkable. No pancreatic ductal dilatation or surrounding inflammatory changes. Spleen: Normal in size without focal abnormality. Adrenals/Urinary Tract: Both adrenal glands appear normal. Mild bilateral renal atrophy is noted. There is a low-density lesion again noted in lower pole of the left kidney measuring 3.9 cm. Bladder is unremarkable. Stomach/Bowel: The stomach, small bowel, and colon are normal in appearance. Scattered colonic diverticula are noted without diverticulitis. The patient has had a prior partial colonic resection with anastomosis. No inflammatory changes, Nelson thickening, or obstructive findings. Vascular/Lymphatic: No mesenteric or retroperitoneal adenopathy seen. There is a small left pelvic sidewall lymph node measuring 9.8 mm, series 2, image 99. Scattered aortic atherosclerotic calcifications are seen without aneurysmal dilatation. Reproductive: Within the endometrial canal there is a IUD which is positioned slightly laterally and leftward.  There is markedly dilated endometrial canal with fluid. Heterogeneously hypodense mass appears to be extending into the posterior uterine fundus. No definite adnexal abnormality is noted. Other: No evidence of abdominal Bartoletti mass or hernia. Musculoskeletal: No acute or significant osseous findings. IMPRESSION: Numerous bilateral solid pulmonary nodules, consistent with pulmonary metastatic disease. Numerous heterogeneous hepatic metastases. Dilated fluid-filled endometrial canal with a cystic mass within the uterine fundus/endometrial canal, concerning for recurrence of endometrial /uterine primary neoplasm. 9 mm left pelvic sidewall lymph node, could represent metastatic disease, would recommend attention on follow-up exam. Electronically Signed   By: Prudencio Pair M.D.   On: 08/16/2019 00:32    EKG: Independently reviewed.  Sinus tachycardia, RBBB similar to prior tracing, QTC 507.  T wave abnormality in inferior leads similar to prior tracing.  Assessment/Plan Principal Problem:   Metastasis ([place]) Active Problems:   Type 2 diabetes mellitus with diabetic chronic kidney disease (HCC)   Generalized weakness   Leukocytosis   Acute metabolic encephalopathy   Concern for recurrence of endometrial cancer with distant metastasis: Last gynecologic visit in October 2020, per review of note patient has history of grade 2 endometrial adenocarcinoma managed nonoperatively due to significant medical comorbidities.  She had a progestin releasing IUD placed in August 2020.  Plan was for patient to follow-up in February for endometrial biopsy.  Chest CT done at this time showing numerous bilateral solid pulmonary nodules consistent with pulmonary metastatic disease. CT abdomen pelvis showing numerous heterogenous hepatic metastases.  Dilated fluid-filled endometrial canal with a cystic mass within the uterine fundus/endometrial canal concerning for recurrence of endometrial/uterine primary neoplasm.  9 mm left  pelvic sidewall lymph node, could represent metastatic disease.  Please consult gynecologic oncology in a.m.  SIRS/possible sepsis: Patient is afebrile.  WBC count 19 and tachycardic with heart rate in the 120s.  Lactic acid normal.  CT chest/abdomen/pelvis without source of infection.  UA and urine culture pending.  Blood culture x2 pending.  Start empiric antibiotics including vancomycin and cefepime.  Continue IV fluid hydration.  Repeat CBC with differential in a.m.  Check procalcitonin level.  Acute metabolic encephalopathy: Does have dementia, unclear what her baseline.  Currently AAO x2 and appears confused.  Ammonia level normal.  UA pending.  Will order stat head CT.   Generalized weakness: Likely related to suspected malignancy, ?  Underlying infection.  PT and OT evaluation ordered.  Hyperglycemia,  non-insulin-dependent diabetes: Blood glucose elevated at 305 without signs of DKA.  Check A1c.  Sliding scale insulin sensitive ACHS and CBG checks.  History of CVA: Hold home Plavix until head CT is done  QT prolongation EKG: Continue cardiac monitoring.  Keep potassium above 4 and magnesium above 2.  Repeat EKG in a.m.  Avoid QT prolonging drugs.  DVT prophylaxis: Hold anticoagulation until head CT is done Code Status: Full code Family Communication: No family available at this time. Disposition Plan: Pending oncology evaluation. Admission status: It is my clinical opinion that referral for OBSERVATION is reasonable and necessary in this patient based on the above information provided. The aforementioned taken together are felt to place the patient at high risk for further clinical deterioration. However it is anticipated that the patient may be medically stable for discharge from the hospital within 24 to 48 hours.  The medical decision making on this patient was of high complexity and the patient is at high risk for clinical deterioration, therefore this is a level 3 visit.  Shela Leff MD [facility]  If 7PM-7AM, please contact night-coverage [email or web address]  08/16/2019, 4:35 AM

## 2019-08-16 NOTE — ED Notes (Signed)
IV infiltration in CT, New IV placed.

## 2019-08-16 NOTE — Consult Note (Addendum)
Gynecologic Oncology Consultation  Christine Zavala 86 y.o. female  CC:  Chief Complaint  Patient presents with  . Weakness    HPI: Christine Zavala is a 87 year old female initially seen in consultation at the request of Dr. Linda Hedges. She experienced post menopausal bleeding in July 2020 for one month and sought evaluation. She underwent an ultrasound on 12/17/2018 revealing a uterus measuring 8.1 x 4.5 x 5.1 cm.  The endometrial thickness was increased at 3.2 cm.  The left ovary was not visualized, she had had a history of a prior right salpingo-oophorectomy. On the same day, she had an endometrial biopsy resulting a grade 2 endometrioid adenocarcinoma.  Her medical history includes colon cancer diagnosed in [place] in 2003 which was treated with surgery which included a left colectomy (low anterior resection) with temporary colostomy.  She was then followed by chemotherapy and radiation followed by reversal of the colostomy.  Preceding that she had a prior abdominal hernia repair with mesh.  A cholecystectomy was performed at the time of 1 for colon cancer surgeries.  She underwent a D&C IUD placement (progestin releasing) was performed on February 17, 2019.  Curettage specimen revealed FIGO grade 2 endometrioid adenocarcinoma. IUD was placed due to patient being a non-operative candidate due to significant medical comorbidities including multi-infarct dementia, morbid obesity with a BMI of 46 kg per metered squared, cerebrovascular disease, diabetes mellitus, a complex abdominal surgical history including prior colon resection, takedown colostomy, and hernia repair.   She presented to the ED on 08/15/2019 for weakness and nausea.  A CT scan was performed revealing: numerous bilateral solid pulmonary nodules, consistent with pulmonary metastatic disease. Numerous heterogeneous hepatic metastases. Dilated fluid-filled endometrial canal with a cystic mass within the uterine fundus/endometrial  canal,concerning for recurrence ofendometrial /uterine primary neoplasm. 9 mm left pelvic sidewall lymph node, could represent metastatic disease, would recommend attention on follow-up exam. She met SIRS criteria and was started on empiric antibiotics.  Interval History: Patient is currently admitted with evidence of metastatic disease. Daughter at the bedside.  Her daughter states her mother has had a 20 pound weight loss over the past month.  She reports decreased appetite, worsening dementia, and increased fatigue.  She took her mother to her PCP 12-13 days ago and she was placed on an antibiotic that did not improve her symptoms.  She took her mother back to her PCP yesterday and was advised to take her mother to the Emergency Room.  She reports right upper quadrant pain three days ago.  Denies vaginal bleeding or abdominal pain besides the occurrence three days ago. Daughter reports shortness of breath on exertion. She has been tolerating her diet while in the hospital. Stating she has had no changes in bowel or bladder habits. No other concerns voiced.   Review of Systems Constitutional: Feels fatigued. Weight loss of 20 lbs and decreased appetite.  Cardiovascular: Positive for shortness of breath. No chest pain or edema.  Pulmonary: No cough or wheeze.  Gastrointestinal: Positive for nausea. No vomiting, or diarrhea. No bright red blood per rectum or change in bowel movement.  Genitourinary: No frequency, urgency, or dysuria. No vaginal bleeding or discharge.  Musculoskeletal: Positive for left upper thigh soreness anteriorly (no erythema or edema noted in this area) Neurologic: Positive for weakness. No numbness, or change in gait.  Psychology: Positive for worsening dementia. No depression, anxiety, or insomnia  Current Meds:   Current Facility-Administered Medications:  .  acetaminophen (TYLENOL) tablet 650 mg, 650  mg, Oral, Q6H PRN **OR** acetaminophen (TYLENOL) suppository 650 mg, 650  mg, Rectal, Q6H PRN, Shela Leff, MD .  ceFEPIme (MAXIPIME) 2 g in sodium chloride 0.9 % 100 mL IVPB, 2 g, Intravenous, Q12H, Shela Leff, MD, Stopped at 08/16/19 [phone] .  enoxaparin (LOVENOX) injection 40 mg, 40 mg, Subcutaneous, Q24H, Mariel Aloe, MD, 40 mg at 08/16/19 1631 .  [START ON 08/17/2019] feeding supplement (ENSURE ENLIVE) (ENSURE ENLIVE) liquid 237 mL, 237 mL, Oral, Q24H, Nettey, Ralph A, MD .  feeding supplement (PRO-STAT SUGAR FREE 64) liquid 30 mL, 30 mL, Oral, BID, Nettey, Ralph A, MD .  insulin aspart (novoLOG) injection 0-5 Units, 0-5 Units, Subcutaneous, QHS, Rathore, Vasundhra, MD .  insulin aspart (novoLOG) injection 0-9 Units, 0-9 Units, Subcutaneous, TID WC, Shela Leff, MD, 3 Units at 08/16/19 1144 .  memantine (NAMENDA XR) 24 hr capsule 14 mg, 14 mg, Oral, QHS, Rathore, Vasundhra, MD .  mirabegron ER (MYRBETRIQ) tablet 50 mg, 50 mg, Oral, Daily, Shela Leff, MD, 50 mg at 08/16/19 0902 .  multivitamin with minerals tablet 1 tablet, 1 tablet, Oral, Daily, Mariel Aloe, MD, 1 tablet at 08/16/19 1630 .  pravastatin (PRAVACHOL) tablet 80 mg, 80 mg, Oral, Daily, Shela Leff, MD, 80 mg at 08/16/19 0902 .  [START ON 08/17/2019] vancomycin (VANCOREADY) IVPB 750 mg/150 mL, 750 mg, Intravenous, Q24H, Shela Leff, MD   Allergy:  Allergies  Allergen Reactions  . Aspirin Nausea And Vomiting  . Calcium-Containing Compounds     Legs burn  . Codeine Sulfate Nausea And Vomiting  . Lipitor [Atorvastatin]     myalgias  . Zocor [Simvastatin]     pruritis  . Zyrtec [Cetirizine]     Social Hx:   Social History   Socioeconomic History  . Marital status: Widowed    Spouse name: Not on file  . Number of children: 2  . Years of education: 13  . Highest education level: Not on file  Occupational History  . Occupation: retired    Fish farm manager: RETIRED  Tobacco Use  . Smoking status: Former Research scientist (life sciences)  . Smokeless tobacco: Never Used  .  Tobacco comment: quit 1970's  Substance and Sexual Activity  . Alcohol use: No  . Drug use: No  . Sexual activity: Not on file  Other Topics Concern  . Not on file  Social History Narrative   Patient lives at home with her Son Christine Zavala). Patient is a widowed. Patient is retired. Right handed.   Caffeine - one cup daily   Social Determinants of Health   Financial Resource Strain:   . Difficulty of Paying Living Expenses:   Food Insecurity:   . Worried About Charity fundraiser in the Last Year:   . Arboriculturist in the Last Year:   Transportation Needs:   . Film/video editor (Medical):   [name] Lack of Transportation (Non-Medical):   Physical Activity:   . Days of Exercise per Week:   . Minutes of Exercise per Session:   Stress:   . Feeling of Stress :   Social Connections:   . Frequency of Communication with Friends and Family:   . Frequency of Social Gatherings with Friends and Family:   . Attends Religious Services:   . Active Member of Clubs or Organizations:   . Attends Archivist Meetings:   [name] Marital Status:   Intimate Partner Violence:   . Fear of Current or Ex-Partner:   . Emotionally Abused:   .  Physically Abused:   . Sexually Abused:     Past Surgical Hx:  Past Surgical History:  Procedure Laterality Date  . CATARACT EXTRACTION, BILATERAL     2001  . CHOLECYSTECTOMY    . COLON SURGERY     2004  . DILATION AND CURETTAGE OF UTERUS N/A 02/17/2019   Procedure: DILATATION AND CURETTAGE;  Surgeon: Everitt Amber, MD;  Location: [facility];  Service: Gynecology;  Laterality: N/A;  . GALLBLADDER SURGERY     2001  . HERNIA REPAIR     2004  . INTRAUTERINE DEVICE (IUD) INSERTION N/A 02/17/2019   Procedure: INTRAUTERINE DEVICE (IUD) INSERTION- MIRENA;  Surgeon: Everitt Amber, MD;  Location: [facility];  Service: Gynecology;  Laterality: N/A;  . ovary removed     2002    Past Medical Hx:  Past Medical History:  Diagnosis Date  . Cancer (HCC)    Rectal   . Cataract   . Dementia ([place])   . Diabetes mellitus without complication ([place])   . DVT (deep venous thrombosis) ([place])   . H/O vitamin D deficiency   . High cholesterol   . Hyperlipidemia   . Hypertension   . Osteoporosis   . Stroke [facility])     Family Hx:  Family History  Problem Relation Age of Onset  . Cancer Mother        Pancreatic  . COPD Mother   . Dementia Father     Vitals:  Blood pressure 126/69, pulse 72, temperature 98.2 F (36.8 C), temperature source Oral, resp. rate 17, height '5\' 2"'  (1.575 m), weight 241 lb 2.9 oz (109.4 kg), SpO2 98 %.  Physical Exam:  General: Well developed, well nourished female in no acute distress. Alert and pleasantly confused intermittently.  Cardiovascular: Regular rate with intermittent irregular beat. No murmurs, clicks, or rubs.   Lungs: Clear to auscultation bilaterally but diminished in the bases. No wheezes/crackles/rhonchi noted.  Abdomen: Abdomen soft, non-tender and morbidly obese. Active bowel sounds in all quadrants.  Extremities: Generalized edema, non-pitting. No bilateral cyanosis or clubbing.   Assessment/Plan: 86 year old currently admitted with evidence of hepatic and pulmonary metastatic disease with a history of grade 2 endometrial cancer diagnosed in 2020 and history of colon cancer in 2003. Recommendation for IR biopsy to confirm malignancy and source (endometrial vs colon vs new primary) per Dr. Denman George. ER/PR and MMR ordered on path specimen from [facility] in 2020. Due to widely metastatic disease, surgery would not be an option at this time. If metastatic endometrial cancer, chemotherapy would be recommended if patient is determined to be a candidate. Dr. Denman George to see the patient tomorrow to discuss further. Daughter given contact information. All questions answered.    Dorothyann Gibbs, NP 08/16/2019, 3:09 PM

## 2019-08-16 NOTE — Evaluation (Signed)
Physical Therapy Evaluation Patient Details Name: Christine Zavala MRN: [id number] DOB: 1933/10/08 Today's Date: 08/16/2019   History of Present Illness  86 yo female admitted with SIRS, weakness, acute metabolic encephalopathy. Hx of DM, dementia, CVA, OA, colon ca, endometrial ca, DVT, vertigo, neuropathy, osteoporosis.  Clinical Impression  On eval, pt required Min-Mod assist for mobility. She was able to perform a stand pivot to/from bsc and take a few ambulatory steps in room using a RW. HR up to 120 bpm with activity. Daughter was present during session. Discussed d/c plan with daughter-pt will return home and resume [place] services. Will continue to follow and progress activity as tolerated.     Follow Up Recommendations Home health PT;Supervision/Assistance - 24 hour    Equipment Recommendations  None recommended by PT    Recommendations for Other Services       Precautions / Restrictions Precautions Precautions: Fall Restrictions Weight Bearing Restrictions: No      Mobility  Bed Mobility Overal bed mobility: Needs Assistance Bed Mobility: Supine to Sit;Sit to Supine     Supine to sit: Mod assist;HOB elevated Sit to supine: Min assist;HOB elevated   General bed mobility comments: Assist for trunk and bil LEs. Increased time. Cues for safety, technique.  Transfers Overall transfer level: Needs assistance Equipment used: Rolling walker (2 wheeled) Transfers: Sit to/from [facility] Sit to Stand: Min assist;From elevated surface Stand pivot transfers: Min assist       General transfer comment: Assist to rise, stabilize, control descent. Cues for safety, hand placement. Stand pivot, bed<>bsc, using RW.  Ambulation/Gait Ambulation/Gait assistance: Min assist Gait Distance (Feet): 5 Feet Assistive device: Rolling walker (2 wheeled) Gait Pattern/deviations: Step-through pattern;Decreased stride length     General Gait Details: Assist to stabilize pt and  maneuver with RW. Cues for safety, proper use of RW.  Stairs            Wheelchair Mobility    Modified Rankin (Stroke Patients Only)       Balance Overall balance assessment: Needs assistance         Standing balance support: Bilateral upper extremity supported Standing balance-Leahy Scale: Poor                               Pertinent Vitals/Pain Pain Assessment: No/denies pain    Home Living Family/patient expects to be discharged to:: Private residence Living Arrangements: Children Available Help at Discharge: Family;Available PRN/intermittently Type of Home: House         Home Equipment: Walker - 2 wheels;Cane - single point      Prior Function Level of Independence: Needs assistance   Gait / Transfers Assistance Needed: uses RW for short distances  ADL's / Homemaking Assistance Needed: assist with bathing, dressing, homemaking        Hand Dominance        Extremity/Trunk Assessment   Upper Extremity Assessment Upper Extremity Assessment: Generalized weakness    Lower Extremity Assessment Lower Extremity Assessment: Generalized weakness    Cervical / Trunk Assessment Cervical / Trunk Assessment: Kyphotic  Communication   Communication: No difficulties  Cognition Arousal/Alertness: Awake/alert Behavior During Therapy: WFL for tasks assessed/performed Overall Cognitive Status: History of cognitive impairments - at baseline  General Comments      Exercises     Assessment/Plan    PT Assessment Patient needs continued PT services  PT Problem List Decreased strength;Decreased mobility;Decreased activity tolerance;Decreased balance;Decreased knowledge of use of DME;Decreased cognition       PT Treatment Interventions DME instruction;Gait training;Therapeutic activities;Therapeutic exercise;Balance training;Patient/family education;Functional mobility training    PT  Goals (Current goals can be found in the Care Plan section)  Acute Rehab PT Goals Patient Stated Goal: per daughter, home and resume home health services PT Goal Formulation: With family Time For Goal Achievement: 08/30/19 Potential to Achieve Goals: Good    Frequency Min 3X/week   Barriers to discharge        Co-evaluation               AM-PAC PT "6 Clicks" Mobility  Outcome Measure Help needed turning from your back to your side while in a flat bed without using bedrails?: A Lot Help needed moving from lying on your back to sitting on the side of a flat bed without using bedrails?: A Lot Help needed moving to and from a bed to a chair (including a wheelchair)?: A Little Help needed standing up from a chair using your arms (e.g., wheelchair or bedside chair)?: A Little Help needed to walk in hospital room?: A Lot Help needed climbing 3-5 steps with a railing? : A Lot 6 Click Score: 14    End of Session   Activity Tolerance: Patient tolerated treatment well Patient left: in bed;with call bell/phone within reach;with bed alarm set;with family/visitor present   PT Visit Diagnosis: Muscle weakness (generalized) (M62.81);Difficulty in walking, not elsewhere classified (R26.2)    Time: [id number] PT Time Calculation (min) (ACUTE ONLY): 20 min   Charges:   PT Evaluation $PT Eval Moderate Complexity: 1 Mod             Tayja Manzer P, PT Acute Rehabilitation

## 2019-08-16 NOTE — ED Notes (Signed)
Unable to get urine sample, pt peed on herself in pad, place purewick on pt

## 2019-08-16 NOTE — Progress Notes (Signed)
Occupational Therapy Evaluation  Clinical Impression PTA pt lives with daughter and receives assistance from family with BADLs and IADL tasks. Pt mobilizes with walker in home. Overall, pt requires set-up to total assist for self-care tasks and Minimal A using RW for functional mobility. Pt required max A to thread B LE socks EOB, but was able to pull socks up on feet. Pt was able to complete bed mobility with steadying assist and extra time using bed rail. Pt requested use of BSC and required total assist with toileting needs using BSC. Pt will benefit from continued skilled acute OT services.    08/16/19 1625  OT Visit Information  Last OT Received On 08/16/19  Assistance Needed +1  History of Present Illness 86 yo female admitted with SIRS, weakness, acute metabolic encephalopathy. Hx of DM, dementia, CVA, OA, colon ca, endometrial ca, DVT, vertigo, neuropathy, osteoporosis.  Precautions  Precautions Fall  Restrictions  Weight Bearing Restrictions No  Home Living  Family/patient expects to be discharged to: Private residence  Living Arrangements Children  Available Help at Discharge Family;Available PRN/intermittently  Type of Home House  Bathroom Shower/Tub Tub/shower unit  [place] - 2 wheels;Cane - single point  Prior Function  Level of Independence Needs assistance  Gait / Transfers Assistance Needed uses RW for short distances  ADL's / [place] assist with bathing, dressing, homemaking  Communication  Communication No difficulties  Pain Assessment  Pain Assessment No/denies pain  Cognition  Arousal/Alertness Awake/alert  Behavior During Therapy WFL for tasks assessed/performed  Overall Cognitive Status History of cognitive impairments - at baseline  Upper Extremity Assessment  Upper Extremity Assessment RUE deficits/detail;LUE deficits/detail  RUE  (Grossly 3+/5, AROM WFL)  LUE  (Grossly 3+/5, AROM WFL)  Lower Extremity Assessment   Lower Extremity Assessment Defer to PT evaluation  ADL  Overall ADL's  Needs assistance/impaired  Eating/Feeding Set up  Grooming Set up  Upper Body Bathing Minimal assistance  Lower Body Bathing Maximal assistance  Upper Body Dressing  Minimal assistance  Lower Body Dressing Maximal assistance  Toilet Transfer Minimal assistance;RW;BSC  Toileting- Clothing Manipulation and Hygiene Total assistance  Tub/ Shower Transfer Moderate assistance  Functional mobility during ADLs Rolling walker;Minimal assistance  Vision- History  Baseline Vision/History Wears glasses  Wears Glasses At all times  Bed Mobility  Overal bed mobility Needs Assistance  Bed Mobility Supine to Sit;Sit to Supine  Supine to sit Mod assist;HOB elevated  Sit to supine Min assist;HOB elevated  General bed mobility comments Assist for trunk and bil LEs. Increased time. Cues for safety, technique.  Transfers  Equipment used Rolling walker (2 wheeled)  Sit to [employer] assist;From elevated surface  Stand pivot transfers Min assist  Balance  Overall balance assessment Needs assistance  Sitting balance-Leahy Scale Fair  Standing balance-Leahy Scale Poor  OT - End of Session  Equipment Utilized During Treatment Rolling walker  Activity Tolerance Patient tolerated treatment well  Patient left in bed;with call bell/phone within reach;with bed alarm set;with family/visitor present  Nurse Communication Mobility status  OT Assessment  OT Recommendation/Assessment Patient needs continued OT Services  OT Visit Diagnosis Unsteadiness on feet (R26.81);Muscle weakness (generalized) (M62.81)  OT Problem List Decreased strength;Decreased activity tolerance;Impaired balance (sitting and/or standing);Decreased safety awareness;Decreased cognition;Decreased knowledge of use of DME or AE  OT Plan  OT Frequency (ACUTE ONLY) Min 2X/week  OT Treatment/Interventions (ACUTE ONLY) Self-care/ADL training;Therapeutic exercise;Energy  conservation;Balance training;Patient/family education;Therapeutic activities;DME and/or AE instruction  AM-PAC OT "6 Clicks" Daily Activity  Outcome Measure (Version 2)  Help from another person eating meals? 3  Help from another person taking care of personal grooming? 3  Help from another person toileting, which includes using toliet, bedpan, or urinal? 1  Help from another person bathing (including washing, rinsing, drying)? 2  Help from another person to put on and taking off regular upper body clothing? 3  Help from another person to put on and taking off regular lower body clothing? 2  6 Click Score 14  OT Recommendation  Follow Up Recommendations Home health OT  OT Equipment 3 in 1 bedside commode  Individuals Consulted  Consulted and Agree with Results and Recommendations Family member/caregiver  Family Member Consulted Daughter  Acute Rehab OT Goals  Patient Stated Goal per daughter, home and resume home health services  OT Goal Formulation With patient/family  Time For Goal Achievement 08/30/19  Potential to Achieve Goals Fair  OT Time Calculation  OT Start Time (ACUTE ONLY) 1539  OT Stop Time (ACUTE ONLY) 1558  OT Time Calculation (min) 19 min  OT General Charges  $OT Visit 1 Visit  OT Evaluation  $OT Eval Moderate Complexity 1 Mod  Written Expression  Dominant Hand Right  Delano Scardino OTR/L

## 2019-08-16 NOTE — Progress Notes (Signed)
Spiriutal care met with pt's daughter to discuss advance care planning options.   Provided education around Advance Directive (HCPOA, Living Will).  Daughter understands pt will have to be oriented x4 at notarization.   Provided basic education around MOST form.    If pt is unable to complete Adv. Directive, pt's adult children are next of kin and default HCPOA.     Jerene Pitch, MDiv, [facility]  Lead Clinical Chaplain Elvina Sidle, [facility].

## 2019-08-16 NOTE — Progress Notes (Signed)
Initial Nutrition Assessment  RD working remotely.   DOCUMENTATION CODES:   Morbid obesity  INTERVENTION:  - will order Ensure Enlive once/day, each supplement provides 350 kcal and 20 grams of protein. - will order 30 mL Prostat BID, each supplement provides 100 kcal and 15 grams of protein. - will order daily multivitamin with minerals.    NUTRITION DIAGNOSIS:   Increased nutrient needs related to acute illness as evidenced by estimated needs.  GOAL:   Patient will meet greater than or equal to 90% of their needs  MONITOR:   PO intake, Supplement acceptance, Labs, Weight trends  REASON FOR ASSESSMENT:   Malnutrition Screening Tool  ASSESSMENT:   86 y.o. female with medical history of dementia, endometrial cancer, colon cancer s/p L colectomy, DM, DVT, HTN, hyperlipidemia, and CVA. She presented to the ED from PCP office for evaluation of AMS, generalized weakness, and abnormal labs (liver enzymes). In the ED, she complained of a slight cough and SOB but no other complaints. CXR showed bilateral pulmonary nodules, largest at R lung base. Chest CT showed numerous bilateral solid pulmonary nodules consistent with pulmonary metastatic disease. CT abdomen/pelvis showed numerous heterogenous hepatic metastases; concern for recurrence of endometrial/uterine primary neoplasm.  Patient noted to be a/o to self and place. No intakes documented since admission. Weight yesterday documented as 252 lb and weight today documented as 241 lb. PTA the most recently documented weight was on 03/22/19 when she weighed 260 lb. This indicates 19 lb weight loss (7.3% body weight) in the past 5 months; not significant for time frame.  Per notes: - SIRS - concern for recurrent endometrial cancer with concern for mets--Gynecology consulted and following - metabolic encephalopathy--improving  - weakness  Labs reviewed; CBGs; 216 and 247 mg/dl, BUN: 28 mg/dl, creatinine: 1.33 mg/dl, Ca: 8.6 mg/dl, Alk  Phos elevated, GFR: 37 ml/min. Medications reviewed; sliding scale novolog, 20 mEq Klor-Con x1 dose 3/23.     NUTRITION - FOCUSED PHYSICAL EXAM:  unable to complete at this time.   Diet Order:   Diet Order            Diet Carb Modified Fluid consistency: Thin; Room service appropriate? Yes  Diet effective now              EDUCATION NEEDS:   Not appropriate for education at this time  Skin:  Skin Assessment: Reviewed RN Assessment  Last BM:  PTA/unknown  Height:   Ht Readings from Last 1 Encounters:  08/16/19 _0  (1.575 m)    Weight:   Wt Readings from Last 1 Encounters:  08/16/19 109.4 kg    Ideal Body Weight:  50 kg  BMI:  Body mass index is 44.11 kg/m.  Estimated Nutritional Needs:   Kcal:  1900-2100 kcal  Protein:  90-100 grams  Fluid:  >/= 2 L/day     Jarome Matin, MS, RD, LDN, CNSC Inpatient Clinical Dietitian RD pager # available in [facility]  After hours/weekend pager # available in [facility]

## 2019-08-16 NOTE — Progress Notes (Signed)
Requested ER/PR and MMR testing on accession [id number] via email with [facility].

## 2019-08-17 LAB — CBC
HCT: 36.8 % (ref 36.0–46.0)
Hemoglobin: 11.2 g/dL — ABNORMAL LOW (ref 12.0–15.0)
MCH: 29.7 pg (ref 26.0–34.0)
MCHC: 30.4 g/dL (ref 30.0–36.0)
MCV: 97.6 fL (ref 80.0–100.0)
Platelets: 192 10*3/uL (ref 150–400)
RBC: 3.77 MIL/uL — ABNORMAL LOW (ref 3.87–5.11)
RDW: 14.3 % (ref 11.5–15.5)
WBC: 13.1 10*3/uL — ABNORMAL HIGH (ref 4.0–10.5)
nRBC: 0 % (ref 0.0–0.2)

## 2019-08-17 LAB — GLUCOSE, CAPILLARY
Glucose-Capillary: 164 mg/dL — ABNORMAL HIGH (ref 70–99)
Glucose-Capillary: 224 mg/dL — ABNORMAL HIGH (ref 70–99)
Glucose-Capillary: 226 mg/dL — ABNORMAL HIGH (ref 70–99)
Glucose-Capillary: 231 mg/dL — ABNORMAL HIGH (ref 70–99)

## 2019-08-17 LAB — BASIC METABOLIC PANEL
Anion gap: 7 (ref 5–15)
BUN: 27 mg/dL — ABNORMAL HIGH (ref 8–23)
CO2: 22 mmol/L (ref 22–32)
Calcium: 8.1 mg/dL — ABNORMAL LOW (ref 8.9–10.3)
Chloride: 107 mmol/L (ref 98–111)
Creatinine, Ser: 1.41 mg/dL — ABNORMAL HIGH (ref 0.44–1.00)
GFR calc Af Amer: 40 mL/min — ABNORMAL LOW (ref >60)
GFR calc non Af Amer: 34 mL/min — ABNORMAL LOW (ref >60)
Glucose, Bld: 244 mg/dL — ABNORMAL HIGH (ref 70–99)
Potassium: 3.8 mmol/L (ref 3.5–5.1)
Sodium: 136 mmol/L (ref 135–145)

## 2019-08-17 LAB — URINE CULTURE
Culture: NO GROWTH
MICRO NUMBER:: 10276741
SPECIMEN QUALITY:: ADEQUATE

## 2019-08-17 LAB — MRSA PCR SCREENING: MRSA by PCR: NEGATIVE

## 2019-08-17 MED ORDER — INSULIN GLARGINE 100 UNIT/ML ~~LOC~~ SOLN
5.0000 [IU] | Freq: Every day | SUBCUTANEOUS | Status: DC
  Administered 2019-08-17 – 2019-08-21 (×5): 5 [IU] via SUBCUTANEOUS
  Filled 2019-08-17 (×6): qty 0.05

## 2019-08-17 MED ORDER — POTASSIUM CHLORIDE CRYS ER 20 MEQ PO TBCR
20.0000 meq | EXTENDED_RELEASE_TABLET | Freq: Once | ORAL | Status: AC
  Administered 2019-08-17: 20 meq via ORAL
  Filled 2019-08-17: qty 1

## 2019-08-17 NOTE — Progress Notes (Signed)
86 y.o, female inpatient. History of endometrial cancer and colon cancer s/p left colectomy admitted for AMD and elevated liver enzymes. Team is requesting liver biopsy for further determination of possible distal metastases.  Pertinent Imaging 3.22.21 - CT abd pelvis reads Numerous heterogeneous hepatic metastases  Pertinent IR History 6.7.12 - port removal   Pertinent Allergies Codeine  WBC is 13.1, Cr 1.41, BUN 27, AST 51  All labs and medications are within acceptable parameters.  Patient is afebrile. Patient is on subcutaneous prophylactic dose of lovenox currenlty listed as discontinued and home medication of plavix. Per daughter last dose given on 3.21.21  IR consulted for possible liver biopsy. Case has been reviewed and procedure approved by Dr. Earleen Newport.  After direct discussion with the patient's daughter for consent the family has had a goals of care discussion and decline procedure at this time.   Should family decide they would like to proceed with biopsy Team instructed to: Keep Patient to be NPO after midnight Hold prophylactic anticoagulation 24 hours prior to scheduled procedure. Plavix would need to be held 5 days prior to procedure.   This was communicated directly to the Team.

## 2019-08-17 NOTE — TOC Progression Note (Signed)
Transition of Care [facility]) - Progression Note    Patient Details  Name: Christine Zavala MRN: [place]:[id number] Date of Birth: 02-05-1934  Transition of [facility]) CM/SW Contact  Purcell Mouton, RN Phone Number: 08/17/2019, 4:49 PM  Clinical Narrative:     Pt is active with [facility] for HHRN/PT.  Expected Discharge Plan: [place] Barriers to Discharge: No Barriers Identified  Expected Discharge Plan and Services Expected Discharge Plan: [place]   Discharge Planning Services: CM Consult   Living arrangements for the past 2 months: [place]: PT [place]: [place] ([place]) Date [place]: 08/17/19 Time [place]: [phone] Representative spoke with at [place]: [place] ([place]) Interventions    Readmission Risk Interventions No flowsheet data found.

## 2019-08-17 NOTE — Progress Notes (Signed)
PROGRESS NOTE    Christine Zavala  [id number] DOB: 1934-02-06 DOA: 08/15/2019 PCP: Alycia Rossetti, MD   Brief Narrative: 86 year old with past medical history significant for dementia, endometrial cancer, colon cancer status post left colectomy, diabetes, DVT, hypertension, hyperlipidemia, CVA presents to the ED for evaluation of altered mental status, generalized weakness and abnormal labs.  Patient was sent to the ED by her PCP for evaluation of leukocytosis and abnormal liver enzymes.  Patient was noted to be confused and she did not know why she is in the hospital. Evaluation in the ED patient was afebrile, white count 19, glucose 305, creatinine 1.3, AST mildly elevated at 51, alkaline phosphatase 263, bilirubin normal.  Ammonia level normal.  Chest x-ray showed bilateral pulmonary nodules, largest of the right lung base.  CT chest showed numerous bilateral solid pulmonary nodules consistent with pulmonary metastatic disease. CT abdomen and pelvis showing numerous heterogeneous hepatic metastasis.  Dilated fluid-filled endometrial canal with a cystic mass within the uterine fundus endometrial canal concerning for recurrence of endometrial or uterine primary neoplasm.   Assessment & Plan:   Principal Problem:   Metastasis ([place]) Active Problems:   Type 2 diabetes mellitus with diabetic chronic kidney disease (HCC)   Generalized weakness   Leukocytosis   Acute metabolic encephalopathy  1-concern for recurrence of endometrial cancer with distant metastasis versus liver metastasis related to colon cancer Discussed with Dr. Harrington Challenger, plan is to proceed with IR evaluation for liver biopsy. Family will discuss tonight goals of care. Received a call from IR PA, family would like to hold on proceeding with biopsy  2-SIRS/possible sepsis: Patient presented with leukocytosis and tachycardia. CT chest abdomen negative for source of infection. UA: Negative, urine culture no growth. Blood cultures:  No growth.  White blood cell trending down If MRSA negative, will discontinue vancomycin Continue with cefepime.  Acute metabolic encephalopathy, underlying dementia and: Generalized weakness: Suspect related to malignancy PT OT eval CT head not acute intracranial abnormality  Hyperglycemia: Non-insulin-dependent diabetes: Continue with sliding scale insulin. Start low-dose Lantus while inpatient  CKD stage III B; monitor renal function. History of CVA: Hold Plavix, until final decision in regards biopsy Prolonged QT: Normal magnesium. Replete k.   Nutrition Problem: Increased nutrient needs Etiology: acute illness    Signs/Symptoms: estimated needs    Interventions: Ensure Enlive (each supplement provides 350kcal and 20 grams of protein), Prostat, MVI  Estimated body mass index is 44.11 kg/m as calculated from the following:   Height as of this encounter: 5\' 2"  (1.575 m).   Weight as of this encounter: 109.4 kg.   DVT prophylaxis: Holding anticoagulation in case require biopsy Code Status: Full code Family Communication: Dr. Harrington Challenger spoke with family today Disposition Plan:  Patient is from: Home Anticipated d/c date: To be determined, awaiting family decision in regards goals of care Barriers to d/c or necessity for inpatient status: Continue with IV antibiotics, in the setting of SIRS, follow cultures  Consultants:   Gynecology oncology     Antimicrobials:  Vancomycin and cefepime  Subjective: Patient is alert, denies dyspnea, cough, dysuria. She Is pleasantly confused   Objective: Vitals:   08/17/19 0515 08/17/19 0942 08/17/19 1111 08/17/19 1313  BP: (!) 143/83 136/79 137/78 (!) 125/59  Pulse: 77 96 (!) 117 73  Resp: (!) 24 20 (!) 22 (!) 22  Temp: 98.7 F (37.1 C) 98.5 F (36.9 C) 98.1 F (36.7 C) 98 F (36.7 C)  TempSrc: Oral Oral Oral Oral  SpO2: 99% 96%  97% 97%  Weight:      Height:        Intake/Output Summary (Last 24 hours) at  08/17/2019 1535 Last data filed at 08/17/2019 0612 Gross per 24 hour  Intake 266.56 ml  Output 200 ml  Net 66.56 ml   Filed Weights   08/15/19 1802 08/16/19 0624  Weight: 114.3 kg 109.4 kg    Examination:  General exam: Appears calm and comfortable  Respiratory system: Clear to auscultation. Respiratory effort normal. Cardiovascular system: S1 & S2 heard, RRR. No JVD, murmurs, rubs, gallops or clicks. No pedal edema. Gastrointestinal system: Abdomen is nondistended, soft and nontender. No organomegaly or masses felt. Normal bowel sounds heard. Central nervous system: Alert Extremities: Symmetric 5 x 5 power. Skin: No rashes, lesions or ulcers   Data Reviewed: I have personally reviewed following labs and imaging studies  CBC: Recent Labs  Lab 08/15/19 1256 08/15/19 1853 08/16/19 0708 08/17/19 1126  WBC 16.8* 19.0* 16.4* 13.1*  NEUTROABS 14,851*  --  13.8*  --   HGB 12.6 12.4 11.9* 11.2*  HCT 38.5 40.0 39.4 36.8  MCV 90.2 93.5 94.9 97.6  PLT 239 258 213 [id number]   Basic Metabolic Panel: Recent Labs  Lab 08/15/19 1256 08/15/19 1853 08/16/19 0708 08/17/19 1126  NA 134* 135  --  136  K 3.8 3.8  --  3.8  CL 97* 100  --  107  CO2 22 22  --  22  GLUCOSE 283* 305*  --  244*  BUN 23 28*  --  27*  CREATININE 1.22* 1.33*  --  1.41*  CALCIUM 8.6 8.6*  --  8.1*  MG  --   --  2.2  --    GFR: Estimated Creatinine Clearance: 35.2 mL/min (A) (by C-G formula based on SCr of 1.41 mg/dL (H)). Liver Function Tests: Recent Labs  Lab 08/15/19 1256 08/15/19 1853  AST 52* 51*  ALT 26 31  ALKPHOS  --  263*  BILITOT 0.9 0.8  PROT 6.0* 6.7  ALBUMIN  --  2.8*   Recent Labs  Lab 08/15/19 1256  LIPASE 11   Recent Labs  Lab 08/15/19 2223  AMMONIA 13   Coagulation Profile: No results for input(s): INR, PROTIME in the last 168 hours. Cardiac Enzymes: No results for input(s): CKTOTAL, CKMB, CKMBINDEX, TROPONINI in the last 168 hours. BNP (last 3 results) No results for  input(s): PROBNP in the last 8760 hours. HbA1C: Recent Labs    08/16/19 0708  HGBA1C 10.6*   CBG: Recent Labs  Lab 08/16/19 1137 08/16/19 1705 08/16/19 2113 08/17/19 0726 08/17/19 1106  GLUCAP 247* 168* 143* 164* 224*   Lipid Profile: No results for input(s): CHOL, HDL, LDLCALC, TRIG, CHOLHDL, LDLDIRECT in the last 72 hours. Thyroid Function Tests: No results for input(s): TSH, T4TOTAL, FREET4, T3FREE, THYROIDAB in the last 72 hours. Anemia Panel: No results for input(s): VITAMINB12, FOLATE, FERRITIN, TIBC, IRON, RETICCTPCT in the last 72 hours. Sepsis Labs: Recent Labs  Lab 08/15/19 2223 08/16/19 0708  PROCALCITON  --  1.94  LATICACIDVEN 1.7  --     Recent Results (from the past 240 hour(s))  SARS-COV-2 RNA,(COVID-19) QUAL NAAT     Status: None   Collection Time: 08/15/19 12:52 PM   Specimen: Respiratory  Result Value Ref Range Status   SARS CoV2 RNA NOT DETECTED NOT DETECT Final    Comment: . A Not Detected (negative) test result for this test means that SARS- CoV-2 RNA was not present in  the specimen above the limit of detection. A negative result does not rule out the possibility of COVID-19 and should not be used as the sole basis for treatment or patient management  decisions.  If COVID-19 is still suspected, based on  exposure history together with other clinical findings, re-testing should be considered in consultation with public health authorities. Laboratory test results should always be considered in the context of clinical  observations and epidemiological data in making a final diagnosis and patient management decisions. . Please review the "Fact Sheets" and FDA authorized labeling available for health care providers and patients using the following websites: https://www.[email or web address]/home/Covid-19/[email or web address]/fact-sheet2  https://www.[facility].com/home/Covid-19/Patients/NAAT/ fact-sheet2 . This test has been authorized by the F DA  under an  Emergency Use Authorization (EUA) for use by authorized laboratories. . Due to the current public health emergency, [facility] is receiving a high volume of samples from a wide variety of swabs and media for COVID-19 testing. In order to serve patients during this public health crisis, samples from appropriate clinical sources are  being tested. Negative test results derived from specimens received in non-commercially manufactured viral collection and transport media, or in media and sample collection kits not yet authorized by FDA for COVID-19 testing should be cautiously evaluated and the patient potentially subjected to extra precautions such as additional clinical monitoring, including collection of an additional specimen. . Methodology:  Nucleic Acid Amplification Test (NAAT) includes RT-PCR or TMA . Additional information about COVID-19 can be found at the [employer] website: www.[email or web address].com/Covid19.   Urine Culture     Status: Abnormal   Collection Time: 08/15/19  1:11 PM   Specimen: Urine  Result Value Ref Range Status   MICRO NUMBER: [id number]  Final   SPECIMEN QUALITY: Adequate  Final   Sample Source URINE  Final   STATUS: FINAL  Final   ISOLATE 1: Coagulase negative staphylococcus, not S. (A)  Final    Comment: 50,000-100,000 CFU/mL of Coagulase negative staphylococcus, not S. saprophyticus May represent colonizers from external and internal genitalia. No further testing (including susceptibility) will be performed.  Blood culture (routine x 2)     Status: None (Preliminary result)   Collection Time: 08/15/19 11:00 PM   Specimen: BLOOD RIGHT ARM  Result Value Ref Range Status   Specimen Description BLOOD RIGHT ARM  Final   Special Requests   Final    BOTTLES DRAWN AEROBIC AND ANAEROBIC Blood Culture results may not be optimal due to an inadequate volume of blood received in culture bottles Performed at [facility], [place] [address] [place], [place] [address]    Culture   Final    NO GROWTH 1 DAY Performed at [facility], [place] [address] [place], [place] [address]    Report Status PENDING  Incomplete  SARS CORONAVIRUS 2 (TAT 6-24 HRS) Nasopharyngeal Nasopharyngeal Swab     Status: None   Collection Time: 08/16/19  2:30 AM   Specimen: Nasopharyngeal Swab  Result Value Ref Range Status   SARS Coronavirus 2 NEGATIVE NEGATIVE Final    Comment: (NOTE) SARS-CoV-2 target nucleic acids are NOT DETECTED. The SARS-CoV-2 RNA is generally detectable in upper and lower respiratory specimens during the acute phase of infection. Negative results do not preclude SARS-CoV-2 infection, do not rule out co-infections with other pathogens, and should not be used as the sole basis for treatment or other patient management decisions. Negative results must be combined with clinical observations, patient history, and epidemiological information. The expected result  is Negative. Fact Sheet for Patients: SugarRoll.be Fact Sheet for Healthcare Providers: https://www.woods-mathews.com/ This test is not yet approved or cleared by the [place] FDA and  has been authorized for detection and/or diagnosis of SARS-CoV-2 by FDA under an Emergency Use Authorization (EUA). This EUA will remain  in effect (meaning this test can be used) for the duration of the COVID-19 declaration under Section 56 4(b)(1) of the Act, 21 U.S.C. section 360bbb-3(b)(1), unless the authorization is terminated or revoked sooner. Performed at [facility], [place] [address], [place], [place] [address]   Blood culture (routine x 2)     Status: None (Preliminary result)   Collection Time: 08/16/19  7:08 AM   Specimen: BLOOD  Result Value Ref Range Status   Specimen Description   Final    BLOOD LEFT ANTECUBITAL Performed at [place] [address],  [place], [place] [address]    Special Requests   Final    BOTTLES DRAWN AEROBIC AND ANAEROBIC Blood Culture adequate volume Performed at [place] [address], [place], [place] [address]    Culture   Final    NO GROWTH < 24 HOURS Performed at [place] [address] [place], [place] [address]    Report Status PENDING  Incomplete  Urine culture     Status: None   Collection Time: 08/16/19 11:54 AM   Specimen: Urine, Catheterized  Result Value Ref Range Status   Specimen Description   Final    URINE, CATHETERIZED Performed at [place] [address] [place], [place] [address]    Special Requests   Final    NONE Performed at [facility], [place] [address], [place], [place] [address]    Culture   Final    NO GROWTH Performed at [facility], [place] [address], [place],  [address]    Report Status 08/17/2019 FINAL  Final         Radiology Studies: DG Chest 2 View  Result Date: 08/15/2019 CLINICAL DATA:  Generalized weakness and nausea, increasing confusion EXAM: CHEST - 2 VIEW COMPARISON:  06/11/2010 FINDINGS: Frontal and lateral views of the chest demonstrate an unremarkable cardiac silhouette. Numerous nodules are seen within the bilateral lungs, largest measuring 1.6 cm at the right lung base. Chest CT recommended for further evaluation. No effusion or pneumothorax. No acute fractures. IMPRESSION: 1. Bilateral pulmonary nodules, largest at the right lung base. CT chest with IV contrast recommended for further evaluation. Electronically Signed   By: Randa Ngo M.D.   On: 08/15/2019 23:25   CT HEAD WO CONTRAST  Result Date: 08/16/2019 CLINICAL DATA:  Encephalopathy EXAM: CT HEAD WITHOUT CONTRAST TECHNIQUE: Contiguous axial images were obtained from the base of the skull through the vertex without intravenous contrast. COMPARISON:  MRI May 16, 2016 FINDINGS: Brain: No evidence of acute  territorial infarction, hemorrhage, hydrocephalus,extra-axial collection or mass lesion/mass effect. There is dilatation the ventricles and sulci consistent with age-related atrophy. Low-attenuation changes in the deep white matter consistent with small vessel ischemia. Prior lacunar infarct seen involving the right internal capsule. Vascular: No hyperdense vessel or unexpected calcification. Skull: The skull is intact. No fracture or focal lesion identified. Sinuses/Orbits: The visualized paranasal sinuses and mastoid air cells are clear. The orbits and globes intact. Other: None IMPRESSION: No acute intracranial abnormality. Findings consistent with age related atrophy and chronic small vessel ischemia Electronically Signed   By: Prudencio Pair  M.D.   On: 08/16/2019 05:22   CT Chest W Contrast  Result Date: 08/16/2019 CLINICAL DATA:  Lower abdominal pain, abnormal chest x-ray, history of colon and endometrial cancer EXAM: CT ABDOMEN AND PELVIS WITH CONTRAST TECHNIQUE: Multidetector CT imaging of the abdomen and pelvis was performed using the standard protocol following bolus administration of intravenous contrast. CONTRAST:  70mL OMNIPAQUE IOHEXOL 300 MG/ML  SOLN COMPARISON:  Oct 22, 2017 FINDINGS: Cardiovascular: Coronary artery aortic valve and mitral valve calcifications are seen. There is scattered dense aortic atherosclerosis. There is atherosclerotic calcification at the origin of the great vessels without significant stenosis. The heart size is normal. There is no pericardial thickening or effusion. Mediastinum/Nodes: There are no enlarged mediastinal, hilar or axillary lymph nodes. The thyroid gland, trachea and esophagus demonstrate no significant findings. Lungs/Pleura: Numerous bilateral solid nodules are seen throughout both lungs the largest for example in the posterior right lung base measuring 1.9 cm. Streaky atelectasis or scarring is seen within the anterior left upper lobe. No pleural effusion  or pneumothorax. For Musculoskeletal/Chest Yan: There is no chest Grandinetti mass or suspicious osseous finding. No acute osseous abnormality Abdomen/pelvis: For Hepatobiliary: Heterogeneously hypodense liver masses are seen throughout. The largest in the anterior upper right liver lobe measuring 6.9 x 5.7 cm, best seen on series 2, image 34. The main portal vein is patent. The patient is status post cholecystectomy. No biliary ductal dilation. Pancreas: Unremarkable. No pancreatic ductal dilatation or surrounding inflammatory changes. Spleen: Normal in size without focal abnormality. Adrenals/Urinary Tract: Both adrenal glands appear normal. Mild bilateral renal atrophy is noted. There is a low-density lesion again noted in lower pole of the left kidney measuring 3.9 cm. Bladder is unremarkable. Stomach/Bowel: The stomach, small bowel, and colon are normal in appearance. Scattered colonic diverticula are noted without diverticulitis. The patient has had a prior partial colonic resection with anastomosis. No inflammatory changes, Deguzman thickening, or obstructive findings. Vascular/Lymphatic: No mesenteric or retroperitoneal adenopathy seen. There is a small left pelvic sidewall lymph node measuring 9.8 mm, series 2, image 99. Scattered aortic atherosclerotic calcifications are seen without aneurysmal dilatation. Reproductive: Within the endometrial canal there is a IUD which is positioned slightly laterally and leftward. There is markedly dilated endometrial canal with fluid. Heterogeneously hypodense mass appears to be extending into the posterior uterine fundus. No definite adnexal abnormality is noted. Other: No evidence of abdominal Riordan mass or hernia. Musculoskeletal: No acute or significant osseous findings. IMPRESSION: Numerous bilateral solid pulmonary nodules, consistent with pulmonary metastatic disease. Numerous heterogeneous hepatic metastases. Dilated fluid-filled endometrial canal with a cystic mass within  the uterine fundus/endometrial canal, concerning for recurrence of endometrial /uterine primary neoplasm. 9 mm left pelvic sidewall lymph node, could represent metastatic disease, would recommend attention on follow-up exam. Electronically Signed   By: Prudencio Pair M.D.   On: 08/16/2019 00:32   CT ABDOMEN PELVIS W CONTRAST  Result Date: 08/16/2019 CLINICAL DATA:  Lower abdominal pain, abnormal chest x-ray, history of colon and endometrial cancer EXAM: CT ABDOMEN AND PELVIS WITH CONTRAST TECHNIQUE: Multidetector CT imaging of the abdomen and pelvis was performed using the standard protocol following bolus administration of intravenous contrast. CONTRAST:  7mL OMNIPAQUE IOHEXOL 300 MG/ML  SOLN COMPARISON:  Oct 22, 2017 FINDINGS: Cardiovascular: Coronary artery aortic valve and mitral valve calcifications are seen. There is scattered dense aortic atherosclerosis. There is atherosclerotic calcification at the origin of the great vessels without significant stenosis. The heart size is normal. There is no pericardial thickening or effusion.  Mediastinum/Nodes: There are no enlarged mediastinal, hilar or axillary lymph nodes. The thyroid gland, trachea and esophagus demonstrate no significant findings. Lungs/Pleura: Numerous bilateral solid nodules are seen throughout both lungs the largest for example in the posterior right lung base measuring 1.9 cm. Streaky atelectasis or scarring is seen within the anterior left upper lobe. No pleural effusion or pneumothorax. For Musculoskeletal/Chest [place]: There is no chest Cromie mass or suspicious osseous finding. No acute osseous abnormality Abdomen/pelvis: For Hepatobiliary: Heterogeneously hypodense liver masses are seen throughout. The largest in the anterior upper right liver lobe measuring 6.9 x 5.7 cm, best seen on series 2, image 34. The main portal vein is patent. The patient is status post cholecystectomy. No biliary ductal dilation. Pancreas: Unremarkable. No  pancreatic ductal dilatation or surrounding inflammatory changes. Spleen: Normal in size without focal abnormality. Adrenals/Urinary Tract: Both adrenal glands appear normal. Mild bilateral renal atrophy is noted. There is a low-density lesion again noted in lower pole of the left kidney measuring 3.9 cm. Bladder is unremarkable. Stomach/Bowel: The stomach, small bowel, and colon are normal in appearance. Scattered colonic diverticula are noted without diverticulitis. The patient has had a prior partial colonic resection with anastomosis. No inflammatory changes, Hirst thickening, or obstructive findings. Vascular/Lymphatic: No mesenteric or retroperitoneal adenopathy seen. There is a small left pelvic sidewall lymph node measuring 9.8 mm, series 2, image 99. Scattered aortic atherosclerotic calcifications are seen without aneurysmal dilatation. Reproductive: Within the endometrial canal there is a IUD which is positioned slightly laterally and leftward. There is markedly dilated endometrial canal with fluid. Heterogeneously hypodense mass appears to be extending into the posterior uterine fundus. No definite adnexal abnormality is noted. Other: No evidence of abdominal Britain mass or hernia. Musculoskeletal: No acute or significant osseous findings. IMPRESSION: Numerous bilateral solid pulmonary nodules, consistent with pulmonary metastatic disease. Numerous heterogeneous hepatic metastases. Dilated fluid-filled endometrial canal with a cystic mass within the uterine fundus/endometrial canal, concerning for recurrence of endometrial /uterine primary neoplasm. 9 mm left pelvic sidewall lymph node, could represent metastatic disease, would recommend attention on follow-up exam. Electronically Signed   By: Prudencio Pair M.D.   On: 08/16/2019 00:32        Scheduled Meds: . feeding supplement (ENSURE ENLIVE)  237 mL Oral Q24H  . feeding supplement (PRO-STAT SUGAR FREE 64)  30 mL Oral BID  . insulin aspart  0-5  Units Subcutaneous QHS  . insulin aspart  0-9 Units Subcutaneous TID WC  . memantine  14 mg Oral QHS  . mirabegron ER  50 mg Oral Daily  . multivitamin with minerals  1 tablet Oral Daily  . pravastatin  80 mg Oral Daily   Continuous Infusions: . ceFEPime (MAXIPIME) IV 2 g (08/17/19 0909)  . vancomycin 750 mg (08/17/19 1006)     LOS: 1 day    Time spent: 35 minutes    Lenny Bouchillon A Jhonny Calixto, MD [facility]   If 7PM-7AM, please contact night-coverage [email or web address]  08/17/2019, 3:35 PM

## 2019-08-18 ENCOUNTER — Encounter (HOSPITAL_COMMUNITY): Payer: Self-pay | Admitting: Family Medicine

## 2019-08-18 LAB — CBC
HCT: 37.5 % (ref 36.0–46.0)
Hemoglobin: 11.3 g/dL — ABNORMAL LOW (ref 12.0–15.0)
MCH: 29.3 pg (ref 26.0–34.0)
MCHC: 30.1 g/dL (ref 30.0–36.0)
MCV: 97.2 fL (ref 80.0–100.0)
Platelets: 216 10*3/uL (ref 150–400)
RBC: 3.86 MIL/uL — ABNORMAL LOW (ref 3.87–5.11)
RDW: 14.4 % (ref 11.5–15.5)
WBC: 13.2 10*3/uL — ABNORMAL HIGH (ref 4.0–10.5)
nRBC: 0 % (ref 0.0–0.2)

## 2019-08-18 LAB — BASIC METABOLIC PANEL
Anion gap: 8 (ref 5–15)
BUN: 31 mg/dL — ABNORMAL HIGH (ref 8–23)
CO2: 20 mmol/L — ABNORMAL LOW (ref 22–32)
Calcium: 8.3 mg/dL — ABNORMAL LOW (ref 8.9–10.3)
Chloride: 110 mmol/L (ref 98–111)
Creatinine, Ser: 1.13 mg/dL — ABNORMAL HIGH (ref 0.44–1.00)
GFR calc Af Amer: 52 mL/min — ABNORMAL LOW (ref >60)
GFR calc non Af Amer: 45 mL/min — ABNORMAL LOW (ref >60)
Glucose, Bld: 216 mg/dL — ABNORMAL HIGH (ref 70–99)
Potassium: 4.2 mmol/L (ref 3.5–5.1)
Sodium: 138 mmol/L (ref 135–145)

## 2019-08-18 LAB — GLUCOSE, CAPILLARY
Glucose-Capillary: 186 mg/dL — ABNORMAL HIGH (ref 70–99)
Glucose-Capillary: 253 mg/dL — ABNORMAL HIGH (ref 70–99)
Glucose-Capillary: 171 mg/dL — ABNORMAL HIGH (ref 70–99)
Glucose-Capillary: 179 mg/dL — ABNORMAL HIGH (ref 70–99)

## 2019-08-18 MED ORDER — MORPHINE SULFATE (PF) 2 MG/ML IV SOLN
0.5000 mg | INTRAVENOUS | Status: DC | PRN
  Administered 2019-08-18: 0.5 mg via INTRAVENOUS
  Filled 2019-08-18: qty 1

## 2019-08-18 MED ORDER — HYDROCODONE-ACETAMINOPHEN 5-325 MG PO TABS: 1.0000 | ORAL_TABLET | Freq: Four times a day (QID) | ORAL | Status: DC | PRN

## 2019-08-18 NOTE — Progress Notes (Signed)
Physical Therapy Treatment Patient Details Name: Christine Zavala MRN: [id number] DOB: 07/27/1933 Today's Date: 08/18/2019    History of Present Illness 86 yo female admitted with SIRS, weakness, acute metabolic encephalopathy, also concern for recurrence of endometrial cancer with distant metastasis versus liver metastasis related to colon cancer. Hx of DM, dementia, CVA, OA, colon ca, endometrial ca, DVT, vertigo, neuropathy, osteoporosis.    PT Comments    Pt assisted OOB and to recliner.  Pt requiring min-mod assist to stand today so recommend assist available if pt discharging home.  Pt pleasant and happy to be out of bed.   Follow Up Recommendations  Home health PT;Supervision/Assistance - 24 hour     Equipment Recommendations  None recommended by PT    Recommendations for Other Services       Precautions / Restrictions Precautions Precautions: Fall    Mobility  Bed Mobility Overal bed mobility: Needs Assistance Bed Mobility: Supine to Sit     Supine to sit: Mod assist;HOB elevated     General bed mobility comments: Assist for trunk and bil LEs. Increased time. Cues for safety, technique.  Transfers Overall transfer level: Needs assistance Equipment used: Rolling walker (2 wheeled) Transfers: Sit to/from [facility] Sit to Stand: Mod assist;Min assist Stand pivot transfers: Min assist       General transfer comment: pt attempted to stand x2 requiring mod assist however on 3rd attempt, min assist to achieve standing, verbal cues for hand placement  Ambulation/Gait                 Stairs             Wheelchair Mobility    Modified Rankin (Stroke Patients Only)       Balance           Standing balance support: Bilateral upper extremity supported Standing balance-Leahy Scale: Poor                              Cognition Arousal/Alertness: Awake/alert Behavior During Therapy: WFL for tasks  assessed/performed Overall Cognitive Status: History of cognitive impairments - at baseline                                        Exercises      General Comments        Pertinent Vitals/Pain Pain Assessment: No/denies pain    Home Living                      Prior Function            PT Goals (current goals can now be found in the care plan section) Progress towards PT goals: Progressing toward goals    Frequency    Min 3X/week      PT Plan Current plan remains appropriate    Co-evaluation              AM-PAC PT "6 Clicks" Mobility   Outcome Measure  Help needed turning from your back to your side while in a flat bed without using bedrails?: A Lot Help needed moving from lying on your back to sitting on the side of a flat bed without using bedrails?: A Lot Help needed moving to and from a bed to a chair (including a wheelchair)?: A Lot Help needed standing up from  a chair using your arms (e.g., wheelchair or bedside chair)?: A Lot Help needed to walk in hospital room?: A Lot Help needed climbing 3-5 steps with a railing? : A Lot 6 Click Score: 12    End of Session Equipment Utilized During Treatment: Gait belt Activity Tolerance: Patient tolerated treatment well Patient left: in chair;with call bell/phone within reach;with chair alarm set   PT Visit Diagnosis: Muscle weakness (generalized) (M62.81);Difficulty in walking, not elsewhere classified (R26.2)     Time: [id number] PT Time Calculation (min) (ACUTE ONLY): 18 min  Charges:  $Therapeutic Activity: 8-22 mins                     Arlyce Dice, DPT Acute Rehabilitation Services Office: [phone]   Trena Platt 08/18/2019, 12:43 PM

## 2019-08-18 NOTE — Progress Notes (Signed)
PROGRESS NOTE    Christine Zavala  [id number] DOB: 1933-08-16 DOA: 08/15/2019 PCP: Alycia Rossetti, MD   Brief Narrative: 86 year old with past medical history significant for dementia, endometrial cancer, colon cancer status post left colectomy, diabetes, DVT, hypertension, hyperlipidemia, CVA presents to the ED for evaluation of altered mental status, generalized weakness and abnormal labs.  Patient was sent to the ED by her PCP for evaluation of leukocytosis and abnormal liver enzymes.  Patient was noted to be confused and she did not know why she is in the hospital. Evaluation in the ED patient was afebrile, white count 19, glucose 305, creatinine 1.3, AST mildly elevated at 51, alkaline phosphatase 263, bilirubin normal.  Ammonia level normal.  Chest x-ray showed bilateral pulmonary nodules, largest of the right lung base.  CT chest showed numerous bilateral solid pulmonary nodules consistent with pulmonary metastatic disease. CT abdomen and pelvis showing numerous heterogeneous hepatic metastasis.  Dilated fluid-filled endometrial canal with a cystic mass within the uterine fundus endometrial canal concerning for recurrence of endometrial or uterine primary neoplasm.   Assessment & Plan:   Principal Problem:   Metastasis ([place]) Active Problems:   Type 2 diabetes mellitus with diabetic chronic kidney disease (HCC)   Generalized weakness   Leukocytosis   Acute metabolic encephalopathy  1-concern for recurrence of endometrial cancer with distant metastasis versus liver metastasis related to colon cancer Discussed with Dr. Harrington Challenger, plan is to proceed with IR evaluation for liver biopsy. Discussed with Daughter family has decide to move forward with hospice care. No further chemo or treatment or Biopsy. I think patient qualify for hospice care due to metastatic cancer, uncurable.   2-SIRS/possible sepsis: Patient presented with leukocytosis and tachycardia. CT chest abdomen negative for  source of infection. UA: Negative, urine culture no growth. Blood cultures: No growth.  White blood cell trending down If MRSA negative,  discontinue vancomycin Continue with cefepime. Plan to discharge on couple more days of oral antibiotics.  Unclear source.   Acute metabolic encephalopathy, underlying dementia and: Generalized weakness: Suspect related to malignancy PT OT eval CT head not acute intracranial abnormality Remain confuse.   Hyperglycemia: Non-insulin-dependent diabetes: Continue with sliding scale insulin. Start low-dose Lantus while inpatient  CKD stage III B; monitor renal function. History of CVA: Hold Plavix, until final decision in regards biopsy Prolonged QT: Normal magnesium. Replete k.   Nutrition Problem: Increased nutrient needs Etiology: acute illness    Signs/Symptoms: estimated needs    Interventions: Ensure Enlive (each supplement provides 350kcal and 20 grams of protein), Prostat, MVI  Estimated body mass index is 44.11 kg/m as calculated from the following:   Height as of this encounter: 5\' 2"  (1.575 m).   Weight as of this encounter: 109.4 kg.   DVT prophylaxis: Holding anticoagulation in case require biopsy Code Status: Full code Family Communication: Discussed with Daughter  Disposition Plan:  Patient is from: Home Anticipated d/c date: To be determined, Home when hospice care is arrange.  Barriers to d/c or necessity for inpatient status: home when hospice care is arrange.  Consultants:   Gynecology oncology     Antimicrobials:  Vancomycin and cefepime  Subjective: Alert, confuse. Not eating a lot today    Objective: Vitals:   08/17/19 1313 08/17/19 2103 08/18/19 0447 08/18/19 1041  BP: (!) 125/59 (!) 152/67 (!) 159/68 138/68  Pulse: 73 96 81 78  Resp: (!) 22 20 18 18   Temp: 98 F (36.7 C) 99.6 F (37.6 C) 98.9 F (37.2 C)  98.9 F (37.2 C)  TempSrc: Oral Oral Oral   SpO2: 97% 99% 98% 98%  Weight:       Height:        Intake/Output Summary (Last 24 hours) at 08/18/2019 1618 Last data filed at 08/18/2019 0900 Gross per 24 hour  Intake 643.44 ml  Output 650 ml  Net -6.56 ml   Filed Weights   08/15/19 1802 08/16/19 0624  Weight: 114.3 kg 109.4 kg    Examination:  General exam: NAD Respiratory system: CTA Cardiovascular system: S 1 S 2 RRR Gastrointestinal system: BS present, soft, nt Central nervous system: Alert, follows command Extremities: no edema   Data Reviewed: I have personally reviewed following labs and imaging studies  CBC: Recent Labs  Lab 08/15/19 1256 08/15/19 1853 08/16/19 0708 08/17/19 1126 08/18/19 0725  WBC 16.8* 19.0* 16.4* 13.1* 13.2*  NEUTROABS 14,851*  --  13.8*  --   --   HGB 12.6 12.4 11.9* 11.2* 11.3*  HCT 38.5 40.0 39.4 36.8 37.5  MCV 90.2 93.5 94.9 97.6 97.2  PLT 239 258 213 192 [id number]   Basic Metabolic Panel: Recent Labs  Lab 08/15/19 1256 08/15/19 1853 08/16/19 0708 08/17/19 1126 08/18/19 0725  NA 134* 135  --  136 138  K 3.8 3.8  --  3.8 4.2  CL 97* 100  --  107 110  CO2 22 22  --  22 20*  GLUCOSE 283* 305*  --  244* 216*  BUN 23 28*  --  27* 31*  CREATININE 1.22* 1.33*  --  1.41* 1.13*  CALCIUM 8.6 8.6*  --  8.1* 8.3*  MG  --   --  2.2  --   --    GFR: Estimated Creatinine Clearance: 43.9 mL/min (A) (by C-G formula based on SCr of 1.13 mg/dL (H)). Liver Function Tests: Recent Labs  Lab 08/15/19 1256 08/15/19 1853  AST 52* 51*  ALT 26 31  ALKPHOS  --  263*  BILITOT 0.9 0.8  PROT 6.0* 6.7  ALBUMIN  --  2.8*   Recent Labs  Lab 08/15/19 1256  LIPASE 11   Recent Labs  Lab 08/15/19 2223  AMMONIA 13   Coagulation Profile: No results for input(s): INR, PROTIME in the last 168 hours. Cardiac Enzymes: No results for input(s): CKTOTAL, CKMB, CKMBINDEX, TROPONINI in the last 168 hours. BNP (last 3 results) No results for input(s): PROBNP in the last 8760 hours. HbA1C: Recent Labs    08/16/19 0708  HGBA1C 10.6*    CBG: Recent Labs  Lab 08/17/19 1106 08/17/19 1605 08/17/19 2107 08/18/19 0754 08/18/19 1118  GLUCAP 224* 226* 231* 186* 253*   Lipid Profile: No results for input(s): CHOL, HDL, LDLCALC, TRIG, CHOLHDL, LDLDIRECT in the last 72 hours. Thyroid Function Tests: No results for input(s): TSH, T4TOTAL, FREET4, T3FREE, THYROIDAB in the last 72 hours. Anemia Panel: No results for input(s): VITAMINB12, FOLATE, FERRITIN, TIBC, IRON, RETICCTPCT in the last 72 hours. Sepsis Labs: Recent Labs  Lab 08/15/19 2223 08/16/19 0708  PROCALCITON  --  1.94  LATICACIDVEN 1.7  --     Recent Results (from the past 240 hour(s))  SARS-COV-2 RNA,(COVID-19) QUAL NAAT     Status: None   Collection Time: 08/15/19 12:52 PM   Specimen: Respiratory  Result Value Ref Range Status   SARS CoV2 RNA NOT DETECTED NOT DETECT Final    Comment: . A Not Detected (negative) test result for this test means that SARS- CoV-2 RNA was not present in  the specimen above the limit of detection. A negative result does not rule out the possibility of COVID-19 and should not be used as the sole basis for treatment or patient management  decisions.  If COVID-19 is still suspected, based on  exposure history together with other clinical findings, re-testing should be considered in consultation with public health authorities. Laboratory test results should always be considered in the context of clinical  observations and epidemiological data in making a final diagnosis and patient management decisions. . Please review the "Fact Sheets" and FDA authorized labeling available for health care providers and patients using the following websites: https://www.[facility].com/home/Covid-19/HCP/NAAT/fact-sheet2  https://www.[facility].com/home/Covid-19/Patients/NAAT/ fact-sheet2 . This test has been authorized by the F DA under an  Emergency Use Authorization (EUA) for use by authorized laboratories. . Due to the  current public health emergency, [facility] is receiving a high volume of samples from a wide variety of swabs and media for COVID-19 testing. In order to serve patients during this public health crisis, samples from appropriate clinical sources are  being tested. Negative test results derived from specimens received in non-commercially manufactured viral collection and transport media, or in media and sample collection kits not yet authorized by FDA for COVID-19 testing should be cautiously evaluated and the patient potentially subjected to extra precautions such as additional clinical monitoring, including collection of an additional specimen. . Methodology:  Nucleic Acid Amplification Test (NAAT) includes RT-PCR or TMA . Additional information about COVID-19 can be found at the [employer] website: www.[facility].com/Covid19.   Urine Culture     Status: Abnormal   Collection Time: 08/15/19  1:11 PM   Specimen: Urine  Result Value Ref Range Status   MICRO NUMBER: [id number]  Final   SPECIMEN QUALITY: Adequate  Final   Sample Source URINE  Final   STATUS: FINAL  Final   ISOLATE 1: Coagulase negative staphylococcus, not S. (A)  Final    Comment: 50,000-100,000 CFU/mL of Coagulase negative staphylococcus, not S. saprophyticus May represent colonizers from external and internal genitalia. No further testing (including susceptibility) will be performed.  Blood culture (routine x 2)     Status: None (Preliminary result)   Collection Time: 08/15/19 11:00 PM   Specimen: BLOOD RIGHT ARM  Result Value Ref Range Status   Specimen Description BLOOD RIGHT ARM  Final   Special Requests   Final    BOTTLES DRAWN AEROBIC AND ANAEROBIC Blood Culture results may not be optimal due to an inadequate volume of blood received in culture bottles Performed at [facility], [place] [address], [place], [place] [address]    Culture   Final    NO GROWTH 2 DAYS  Performed at [facility] [address] [place], [place] [address]    Report Status PENDING  Incomplete  SARS CORONAVIRUS 2 (TAT 6-24 HRS) Nasopharyngeal Nasopharyngeal Swab     Status: None   Collection Time: 08/16/19  2:30 AM   Specimen: Nasopharyngeal Swab  Result Value Ref Range Status   SARS Coronavirus 2 NEGATIVE NEGATIVE Final    Comment: (NOTE) SARS-CoV-2 target nucleic acids are NOT DETECTED. The SARS-CoV-2 RNA is generally detectable in upper and lower respiratory specimens during the acute phase of infection. Negative results do not preclude SARS-CoV-2 infection, do not rule out co-infections with other pathogens, and should not be used as the sole basis for treatment or other patient management decisions. Negative results must be combined with clinical observations, patient history, and epidemiological information. The expected result  is Negative. Fact Sheet for Patients: SugarRoll.be Fact Sheet for Healthcare Providers: https://www.woods-mathews.com/ This test is not yet approved or cleared by the [place] FDA and  has been authorized for detection and/or diagnosis of SARS-CoV-2 by FDA under an Emergency Use Authorization (EUA). This EUA will remain  in effect (meaning this test can be used) for the duration of the COVID-19 declaration under Section 56 4(b)(1) of the Act, 21 U.S.C. section 360bbb-3(b)(1), unless the authorization is terminated or revoked sooner. Performed at [facility], [place] [address] [place], [place] [address]   Blood culture (routine x 2)     Status: None (Preliminary result)   Collection Time: 08/16/19  7:08 AM   Specimen: BLOOD  Result Value Ref Range Status   Specimen Description   Final    BLOOD LEFT ANTECUBITAL Performed at [facility] [address] [place], [place] [address]    Special Requests   Final    BOTTLES DRAWN AEROBIC AND ANAEROBIC Blood  Culture adequate volume Performed at [facility] [address] [place], [place] [address]    Culture   Final    NO GROWTH 2 DAYS Performed at [place] [address], [place], [place] [address]    Report Status PENDING  Incomplete  Urine culture     Status: None   Collection Time: 08/16/19 11:54 AM   Specimen: Urine, Catheterized  Result Value Ref Range Status   Specimen Description   Final    URINE, CATHETERIZED Performed at [facility] [address] [place], [place] [address]    Special Requests   Final    NONE Performed at [facility], [place] [address] [place], [place] [address]    Culture   Final    NO GROWTH Performed at [facility], [place] [address], [place], [place] [address]    Report Status 08/17/2019 FINAL  Final  MRSA PCR Screening     Status: None   Collection Time: 08/17/19  4:40 PM   Specimen: Nasopharyngeal  Result Value Ref Range Status   MRSA by PCR NEGATIVE NEGATIVE Final    Comment:        The GeneXpert MRSA Assay (FDA approved for NASAL specimens only), is one component of a comprehensive MRSA colonization surveillance program. It is not intended to diagnose MRSA infection nor to guide or monitor treatment for MRSA infections. Performed at [facility], [place] [address], [place],  [address]          Radiology Studies: No results found.      Scheduled Meds: . feeding supplement (ENSURE ENLIVE)  237 mL Oral Q24H  . feeding supplement (PRO-STAT SUGAR FREE 64)  30 mL Oral BID  . insulin aspart  0-5 Units Subcutaneous QHS  . insulin aspart  0-9 Units Subcutaneous TID WC  . insulin glargine  5 Units Subcutaneous QHS  . memantine  14 mg Oral QHS  . mirabegron ER  50 mg Oral Daily  . multivitamin with minerals  1 tablet Oral Daily  . pravastatin  80 mg Oral Daily   Continuous Infusions: . ceFEPime (MAXIPIME) IV 2 g (08/18/19 1043)      LOS: 2 days    Time spent: 35 minutes    Breta Demedeiros A Eirene Rather, MD [facility]   If 7PM-7AM, please contact night-coverage [email or web address]  08/18/2019, 4:18 PM

## 2019-08-18 NOTE — TOC Progression Note (Signed)
Transition of Care [facility]) - Progression Note    Patient Details  Name: Christine Zavala MRN: [id number] Date of Birth: [birth year]  Transition of Care [facility]) CM/SW Contact  Purcell Mouton, RN Phone Number: 08/18/2019, 3:45 PM  Clinical Narrative:    Spoke with pt's daughter [name] who selected [facility]. Referral was called to in house rep to call pt's daughter [name].    Expected Discharge Plan: [place] Barriers to Discharge: No Barriers Identified  Expected Discharge Plan and Services Expected Discharge Plan: [place]   Discharge Planning Services: CM Consult   Living arrangements for the past 2 months: [place]: PT [place]: [place] ([place]) Date [place]: 08/17/19 Time [place]: [phone] Representative spoke with at [place]: [place] ([place]) Interventions    Readmission Risk Interventions No flowsheet data found.

## 2019-08-18 NOTE — Progress Notes (Signed)
Occupational therapy Note  Clinical Impression Pt was noted with increase in confusion from prior therapy session. Pt was oriented to name only. Pt was not aware of lunch meal in front of her on tray table. Pt required tactile assistance and verbal cues to initiate self-feeding using regular utensils. Pt was able to remain attentive to task, but not able to problem solve when to use fork or spoon. Pt required extra time for self-feeding and grooming task while sitting in recliner with chair alarm on.     08/18/19 1540  OT Visit Information  Assistance Needed +1  History of Present Illness 86 yo female admitted with SIRS, weakness, acute metabolic encephalopathy, also concern for recurrence of endometrial cancer with distant metastasis versus liver metastasis related to colon cancer. Hx of DM, dementia, CVA, OA, colon ca, endometrial ca, DVT, vertigo, neuropathy, osteoporosis.  Precautions  Precautions Fall  Pain Assessment  Pain Assessment No/denies pain  Cognition  Arousal/Alertness Awake/alert  Behavior During Therapy WFL for tasks assessed/performed  Overall Cognitive Status History of cognitive impairments - at baseline  General Comments alert and oriented to name only   ADL  Eating/Feeding Minimal assistance  Grooming Set up;Wash/dry face;Wash/dry hands  General ADL Comments verbal cues to initiate self-feeding   OT Assessment/Plan  Follow Up Recommendations Home health OT;Supervision/Assistance - 24 hour  OT Equipment 3 in 1 bedside commode  AM-PAC OT "6 Clicks" Daily Activity Outcome Measure (Version 2)  Help from another person eating meals? 3  Help from another person taking care of personal grooming? 3  Help from another person toileting, which includes using toliet, bedpan, or urinal? 1  Help from another person bathing (including washing, rinsing, drying)? 2  Help from another person to put on and taking off regular upper body clothing? 3  Help from another person to put  on and taking off regular lower body clothing? 1  6 Click Score 13  OT Goal Progression  Progress towards OT goals Progressing toward goals  OT Time Calculation  OT Start Time (ACUTE ONLY) 1350  OT Stop Time (ACUTE ONLY) 1405  OT Time Calculation (min) 15 min  OT General Charges  $OT Visit 1 Visit  OT Treatments  $Self Care/Home Management  8-22 mins   Jagger Demonte OTR/L

## 2019-08-18 NOTE — Progress Notes (Signed)
[facility])   Referral received for hospice services at home once discharged.  Spoke with daughter [name] to  explain services and offered support. [name] has lots of questions about her mother's prognosis and may benefit from a Palliative consult to discuss goals of care.   She plans to discuss hospice services with her brother tonight. [place] liaison will follow up tomorrow.   Plan is to d/c home once medically stable.  DME needs are not clear. Likely will need a hospital bed.   Thank you for this referral.   Farrel Gordon, RN  [facility] Liaison (in [place])  [phone]

## 2019-08-19 ENCOUNTER — Telehealth: Payer: Self-pay | Admitting: *Deleted

## 2019-08-19 DIAGNOSIS — Z515 Encounter for palliative care: Secondary | ICD-10-CM

## 2019-08-19 DIAGNOSIS — G9341 Metabolic encephalopathy: Secondary | ICD-10-CM

## 2019-08-19 DIAGNOSIS — Z7189 Other specified counseling: Secondary | ICD-10-CM

## 2019-08-19 LAB — GLUCOSE, CAPILLARY
Glucose-Capillary: 164 mg/dL — ABNORMAL HIGH (ref 70–99)
Glucose-Capillary: 159 mg/dL — ABNORMAL HIGH (ref 70–99)
Glucose-Capillary: 227 mg/dL — ABNORMAL HIGH (ref 70–99)
Glucose-Capillary: 231 mg/dL — ABNORMAL HIGH (ref 70–99)

## 2019-08-19 LAB — CEA: CEA: 28.4 ng/mL — ABNORMAL HIGH (ref 0.0–4.7)

## 2019-08-19 LAB — SURGICAL PATHOLOGY

## 2019-08-19 MED ORDER — SODIUM CHLORIDE 0.9 % IV SOLN: INTRAVENOUS | Status: DC

## 2019-08-19 NOTE — Telephone Encounter (Signed)
Noted  

## 2019-08-19 NOTE — Consult Note (Signed)
Palliative Care Consult Note  Reason for consult: Goals of care and hospice evaluation in light of metastatic cancer  Palliative care consult received.  Chart reviewed including personal review of pertinent labs and imaging.  Discussed with bedside RN, Venia Carbon from [facility] hospice, and Dr. Tyrell Antonio.  Briefly, Christine Zavala is an 86 year old female with past medical history of dementia, endometrial cancer, colon cancer status post left colectomy, diabetes, DVT, hypertension, hyperlipidemia, CVA who presented to the ED with altered mental status weakness and abnormal labs after being seen by her PCP.  Work-up in the ED revealed CT with numerous hepatic masses and numerous bilateral solid pulmonary to my nodules consistent with pulmonary metastatic disease.  She met SIRS criteria and was a started on empiric antibiotics.  She was evaluated by Dr. Denman George with Gyn Onc and recommendation was for biopsy if further evaluation of cancer and potential treatment options was desired.  Following discussion, family elected to forego further work-up.  They met with hospice for discussion regarding potential home with hospice but they were noted to have questions regarding her prognosis as well as care plan moving forward.  Palliative care consulted to assist with goals of care and hospice evaluation.  During chart review, it appears that she has had an up-and-down course with it being documented that she ate 100% of breakfast and 50% of her dinner on 3/25.  She is also noted to have been more confused than previously and required some assist to stand in order to get to to bedside chair.    In discussion with care team today, she seems to be much more confused and has largely been nonverbal.  She is not eating nor drinking and no documented intake today per chart review.  I saw and examined Christine Zavala today.  She open her eyes and stated okay when I asked if she had any problems.  She did not otherwise engage in  conversation.  - I attempted to call her daughter, Christine Zavala, in order to discuss care plan moving forward.  I left a voicemail requesting return call to set up family meeting. - Overall, she certainly would be appropriate for hospice services with an expected prognosis of less than 6 months.  At the same time, she appears of had acute decline in her mental status, functional status, and nutrition over the past day.  Family had noted potential interest in residential hospice.  Based upon earlier notes this admission, I do not know that we would be at a point where she would qualify for residential hospice services.  However, with what appears to be an acute decline, I recommend we keep close eye on her intake overall and plan to meet with family tomorrow if they are available.  Time in: 1800 Time out: 1850  Total time: 50 minutes  Greater than 50%  of this time was spent counseling and coordinating care related to the above assessment and plan.'  Micheline Rough, MD [facility] [phone]

## 2019-08-19 NOTE — Progress Notes (Signed)
PROGRESS NOTE    Christine Zavala  [id number] DOB: August 23, 1933 DOA: 08/15/2019 PCP: Alycia Rossetti, MD   Brief Narrative: 86 year old with past medical history significant for dementia, endometrial cancer, colon cancer status post left colectomy, diabetes, DVT, hypertension, hyperlipidemia, CVA presents to the ED for evaluation of altered mental status, generalized weakness and abnormal labs.  Patient was sent to the ED by her PCP for evaluation of leukocytosis and abnormal liver enzymes.  Patient was noted to be confused and she did not know why she is in the hospital. Evaluation in the ED patient was afebrile, white count 19, glucose 305, creatinine 1.3, AST mildly elevated at 51, alkaline phosphatase 263, bilirubin normal.  Ammonia level normal.  Chest x-ray showed bilateral pulmonary nodules, largest of the right lung base.  CT chest showed numerous bilateral solid pulmonary nodules consistent with pulmonary metastatic disease. CT abdomen and pelvis showing numerous heterogeneous hepatic metastasis.  Dilated fluid-filled endometrial canal with a cystic mass within the uterine fundus endometrial canal concerning for recurrence of endometrial or uterine primary neoplasm.   Assessment & Plan:   Principal Problem:   Metastasis ([place]) Active Problems:   Type 2 diabetes mellitus with diabetic chronic kidney disease (HCC)   Generalized weakness   Leukocytosis   Acute metabolic encephalopathy  1-concern for recurrence of endometrial cancer with distant metastasis versus liver metastasis related to colon cancer Discussed with Dr. Harrington Challenger, plan is to proceed with IR evaluation for liver biopsy. Discussed with Daughter family has decide to move forward with hospice care. No further chemo or treatment or Biopsy. I think patient qualify for hospice care due to metastatic cancer, uncurable.  Palliative care consulted for evaluation for hospice facility evaluation.   2-SIRS/possible sepsis: Patient  presented with leukocytosis and tachycardia. CT chest abdomen negative for source of infection. UA: Negative, urine culture no growth. Blood cultures: No growth.  White blood cell trending down If MRSA negative,  discontinue vancomycin Continue with cefepime. Day 3. Unclear source.   Acute metabolic encephalopathy, underlying dementia and: Generalized weakness: Suspect related to malignancy PT OT eval CT head not acute intracranial abnormality Remain confuse.   Hyperglycemia: Non-insulin-dependent diabetes: Continue with sliding scale insulin. Start low-dose Lantus while inpatient  CKD stage III B; monitor renal function. History of CVA: Hold Plavix, until final decision in regards biopsy Prolonged QT: Normal magnesium. Replete k.   Nutrition Problem: Increased nutrient needs Etiology: acute illness    Signs/Symptoms: estimated needs    Interventions: Ensure Enlive (each supplement provides 350kcal and 20 grams of protein), Prostat, MVI  Estimated body mass index is 44.11 kg/m as calculated from the following:   Height as of this encounter: 5\' 2"  (1.575 m).   Weight as of this encounter: 109.4 kg.   DVT prophylaxis: Holding anticoagulation in case require biopsy Code Status: Full code Family Communication: Discussed with Daughter 3-26 discussed prognosis and family would like patient to go to residential hospice if she qualify/. Palliative consulted.  Disposition Plan:  Patient is from: Home Anticipated d/c date: To be determined, Home when hospice care is arrange.  Barriers to d/c or necessity for inpatient status: home when hospice care is arrange.  Consultants:   Gynecology oncology     Antimicrobials:  Vancomycin and cefepime  Subjective: Sleepy, confuse. Answer no to all questions.   Objective: Vitals:   08/18/19 1600 08/18/19 2012 08/19/19 0507 08/19/19 1318  BP: 136/66 (!) 143/69 (!) 158/75 (!) 142/77  Pulse: 76 77 88 98  Resp:  20 20  Temp:  98.7 F (37.1 C) 98.1 F (36.7 C) 97.6 F (36.4 C) 99.3 F (37.4 C)  TempSrc: Oral Oral  Oral  SpO2: 96% 96% 96% 96%  Weight:      Height:        Intake/Output Summary (Last 24 hours) at 08/19/2019 1722 Last data filed at 08/19/2019 1018 Gross per 24 hour  Intake 120 ml  Output --  Net 120 ml   Filed Weights   08/15/19 1802 08/16/19 0624  Weight: 114.3 kg 109.4 kg    Examination:  General exam: NAD Respiratory system:  CTA Cardiovascular system: S 1, S 2 RRR Gastrointestinal system: BS present, soft, nt Central nervous system: sleepy Extremities: No edema   Data Reviewed: I have personally reviewed following labs and imaging studies  CBC: Recent Labs  Lab 08/15/19 1256 08/15/19 1853 08/16/19 0708 08/17/19 1126 08/18/19 0725  WBC 16.8* 19.0* 16.4* 13.1* 13.2*  NEUTROABS 14,851*  --  13.8*  --   --   HGB 12.6 12.4 11.9* 11.2* 11.3*  HCT 38.5 40.0 39.4 36.8 37.5  MCV 90.2 93.5 94.9 97.6 97.2  PLT 239 258 213 192 [id number]   Basic Metabolic Panel: Recent Labs  Lab 08/15/19 1256 08/15/19 1853 08/16/19 0708 08/17/19 1126 08/18/19 0725  NA 134* 135  --  136 138  K 3.8 3.8  --  3.8 4.2  CL 97* 100  --  107 110  CO2 22 22  --  22 20*  GLUCOSE 283* 305*  --  244* 216*  BUN 23 28*  --  27* 31*  CREATININE 1.22* 1.33*  --  1.41* 1.13*  CALCIUM 8.6 8.6*  --  8.1* 8.3*  MG  --   --  2.2  --   --    GFR: Estimated Creatinine Clearance: 43.9 mL/min (A) (by C-G formula based on SCr of 1.13 mg/dL (H)). Liver Function Tests: Recent Labs  Lab 08/15/19 1256 08/15/19 1853  AST 52* 51*  ALT 26 31  ALKPHOS  --  263*  BILITOT 0.9 0.8  PROT 6.0* 6.7  ALBUMIN  --  2.8*   Recent Labs  Lab 08/15/19 1256  LIPASE 11   Recent Labs  Lab 08/15/19 2223  AMMONIA 13   Coagulation Profile: No results for input(s): INR, PROTIME in the last 168 hours. Cardiac Enzymes: No results for input(s): CKTOTAL, CKMB, CKMBINDEX, TROPONINI in the last 168 hours. BNP (last 3  results) No results for input(s): PROBNP in the last 8760 hours. HbA1C: No results for input(s): HGBA1C in the last 72 hours. CBG: Recent Labs  Lab 08/18/19 1630 08/18/19 2009 08/19/19 0729 08/19/19 1113 08/19/19 1657  GLUCAP 171* 179* 164* 159* 227*   Lipid Profile: No results for input(s): CHOL, HDL, LDLCALC, TRIG, CHOLHDL, LDLDIRECT in the last 72 hours. Thyroid Function Tests: No results for input(s): TSH, T4TOTAL, FREET4, T3FREE, THYROIDAB in the last 72 hours. Anemia Panel: No results for input(s): VITAMINB12, FOLATE, FERRITIN, TIBC, IRON, RETICCTPCT in the last 72 hours. Sepsis Labs: Recent Labs  Lab 08/15/19 2223 08/16/19 0708  PROCALCITON  --  1.94  LATICACIDVEN 1.7  --     Recent Results (from the past 240 hour(s))  SARS-COV-2 RNA,(COVID-19) QUAL NAAT     Status: None   Collection Time: 08/15/19 12:52 PM   Specimen: Respiratory  Result Value Ref Range Status   SARS CoV2 RNA NOT DETECTED NOT DETECT Final    Comment: . A Not Detected (negative) test result  for this test means that SARS- CoV-2 RNA was not present in the specimen above the limit of detection. A negative result does not rule out the possibility of COVID-19 and should not be used as the sole basis for treatment or patient management  decisions.  If COVID-19 is still suspected, based on  exposure history together with other clinical findings, re-testing should be considered in consultation with public health authorities. Laboratory test results should always be considered in the context of clinical  observations and epidemiological data in making a final diagnosis and patient management decisions. . Please review the "Fact Sheets" and FDA authorized labeling available for health care providers and patients using the following websites: https://www.[email or web address]/home/Covid-[email or web address]/fact-sheet2  https://www.[facility].com/home/Covid-19/Patients/NAAT/ fact-sheet2 . This test has  been authorized by the F DA under an  Emergency Use Authorization (EUA) for use by authorized laboratories. . Due to the current public health emergency, [facility] is receiving a high volume of samples from a wide variety of swabs and media for COVID-19 testing. In order to serve patients during this public health crisis, samples from appropriate clinical sources are  being tested. Negative test results derived from specimens received in non-commercially manufactured viral collection and transport media, or in media and sample collection kits not yet authorized by FDA for COVID-19 testing should be cautiously evaluated and the patient potentially subjected to extra precautions such as additional clinical monitoring, including collection of an additional specimen. . Methodology:  Nucleic Acid Amplification Test (NAAT) includes RT-PCR or TMA . Additional information about COVID-19 can be found at the [employer] website: www.[email or web address].com/Covid19.   Urine Culture     Status: Abnormal   Collection Time: 08/15/19  1:11 PM   Specimen: Urine  Result Value Ref Range Status   MICRO NUMBER: [id number]  Final   SPECIMEN QUALITY: Adequate  Final   Sample Source URINE  Final   STATUS: FINAL  Final   ISOLATE 1: Coagulase negative staphylococcus, not S. (A)  Final    Comment: 50,000-100,000 CFU/mL of Coagulase negative staphylococcus, not S. saprophyticus May represent colonizers from external and internal genitalia. No further testing (including susceptibility) will be performed.  Blood culture (routine x 2)     Status: None (Preliminary result)   Collection Time: 08/15/19 11:00 PM   Specimen: BLOOD RIGHT ARM  Result Value Ref Range Status   Specimen Description BLOOD RIGHT ARM  Final   Special Requests   Final    BOTTLES DRAWN AEROBIC AND ANAEROBIC Blood Culture results may not be optimal due to an inadequate volume of blood received in culture bottles Performed at  [facility], [place] [address], [place], [place] [address]    Culture   Final    NO GROWTH 3 DAYS Performed at [facility], [place] [address], [place], [place] [address]    Report Status PENDING  Incomplete  SARS CORONAVIRUS 2 (TAT 6-24 HRS) Nasopharyngeal Nasopharyngeal Swab     Status: None   Collection Time: 08/16/19  2:30 AM   Specimen: Nasopharyngeal Swab  Result Value Ref Range Status   SARS Coronavirus 2 NEGATIVE NEGATIVE Final    Comment: (NOTE) SARS-CoV-2 target nucleic acids are NOT DETECTED. The SARS-CoV-2 RNA is generally detectable in upper and lower respiratory specimens during the acute phase of infection. Negative results do not preclude SARS-CoV-2 infection, do not rule out co-infections with other pathogens, and should not be used as the sole basis for treatment or other patient management decisions. Negative results must be  combined with clinical observations, patient history, and epidemiological information. The expected result is Negative. Fact Sheet for Patients: SugarRoll.be Fact Sheet for Healthcare Providers: https://www.woods-mathews.com/ This test is not yet approved or cleared by the [place] FDA and  has been authorized for detection and/or diagnosis of SARS-CoV-2 by FDA under an Emergency Use Authorization (EUA). This EUA will remain  in effect (meaning this test can be used) for the duration of the COVID-19 declaration under Section 56 4(b)(1) of the Act, 21 U.S.C. section 360bbb-3(b)(1), unless the authorization is terminated or revoked sooner. Performed at [facility], [place] [address] [place], [place] [address]   Blood culture (routine x 2)     Status: None (Preliminary result)   Collection Time: 08/16/19  7:08 AM   Specimen: BLOOD  Result Value Ref Range Status   Specimen Description   Final    BLOOD LEFT ANTECUBITAL Performed at [facility]  [address], [place], [place] [address]    Special Requests   Final    BOTTLES DRAWN AEROBIC AND ANAEROBIC Blood Culture adequate volume Performed at [place] [address] [place], [place] [address]    Culture   Final    NO GROWTH 3 DAYS Performed at [facility], [place] [address] [place], [place] [address]    Report Status PENDING  Incomplete  Urine culture     Status: None   Collection Time: 08/16/19 11:54 AM   Specimen: Urine, Catheterized  Result Value Ref Range Status   Specimen Description   Final    URINE, CATHETERIZED Performed at [facility] [address] [place], [place] [address]    Special Requests   Final    NONE Performed at [facility], [place] [address], [place], [place] [address]    Culture   Final    NO GROWTH Performed at [facility], [place] [address] [place], [place] [address]    Report Status 08/17/2019 FINAL  Final  MRSA PCR Screening     Status: None   Collection Time: 08/17/19  4:40 PM   Specimen: Nasopharyngeal  Result Value Ref Range Status   MRSA by PCR NEGATIVE NEGATIVE Final    Comment:        The GeneXpert MRSA Assay (FDA approved for NASAL specimens only), is one component of a comprehensive MRSA colonization surveillance program. It is not intended to diagnose MRSA infection nor to guide or monitor treatment for MRSA infections. Performed at [facility], [place] [address], [place], [place] [address]          Radiology Studies: No results found.      Scheduled Meds: . feeding supplement (ENSURE ENLIVE)  237 mL Oral Q24H  . feeding supplement (PRO-STAT SUGAR FREE 64)  30 mL Oral BID  . insulin aspart  0-5 Units Subcutaneous QHS  . insulin aspart  0-9 Units Subcutaneous TID WC  . insulin glargine  5 Units Subcutaneous QHS  . memantine  14 mg Oral QHS  . mirabegron ER  50 mg Oral Daily  . multivitamin with minerals  1 tablet Oral  Daily  . pravastatin  80 mg Oral Daily   Continuous Infusions: . sodium chloride 75 mL/hr at 08/19/19 1126  . ceFEPime (MAXIPIME) IV 2 g (08/19/19 1129)     LOS: 3 days    Time spent: 35 minutes    Elmarie Shiley, MD [facility]   If  7PM-7AM, please contact night-coverage [email or web address]  08/19/2019, 5:22 PM

## 2019-08-19 NOTE — Progress Notes (Signed)
Pharmacy Antibiotic Note  Christine Zavala is a 86 y.o. female admitted on 08/15/2019 with sepsis.  Pharmacy was consulted for Vancomycin, cefepime dosing on 3/23. Vancomycin d/c 3/25  Plan: Continue Cefepime at 2gm q12 No liver biopsy, plan hospice, oral abx at discharge to complete course  Height: 5\' 2"  (157.5 cm) Weight: 241 lb 2.9 oz (109.4 kg) IBW/kg (Calculated) : 50.1  Temp (24hrs), Avg:98.3 F (36.8 C), Min:97.6 F (36.4 C), Max:98.9 F (37.2 C)  Recent Labs  Lab 08/15/19 1256 08/15/19 1853 08/15/19 2223 08/16/19 0708 08/17/19 1126 08/18/19 0725  WBC 16.8* 19.0*  --  16.4* 13.1* 13.2*  CREATININE 1.22* 1.33*  --   --  1.41* 1.13*  LATICACIDVEN  --   --  1.7  --   --   --     Estimated Creatinine Clearance: 43.9 mL/min (A) (by C-G formula based on SCr of 1.13 mg/dL (H)).    Allergies  Allergen Reactions  . Aspirin Nausea And Vomiting  . Calcium-Containing Compounds     Legs burn  . Codeine Sulfate Nausea And Vomiting  . Lipitor [Atorvastatin]     myalgias  . Zocor [Simvastatin]     pruritis  . Zyrtec [Cetirizine]     Antimicrobials this admission: 3/23 Vancomycin  >>3/25 3/23 Cefepime  >>  PTA Trimethoprim for prophy - not resumed  Dose adjustments this admission:  Microbiology results: 3/24 MRSA PCR: neg 3/23 BCx: ngtd 3/23 UCx (cath): ng-final 3/22 UCx: CoNS, not saprophyticus, possible colonizers from skin  Thank you for allowing pharmacy to be a part of this patient's care.  Minda Ditto PharmD 08/19/2019 6:39 AM

## 2019-08-19 NOTE — Telephone Encounter (Signed)
Received call from Delsa Sale, case manager with [facility]. [phone]~ telephone.  Reports that patient is scheduled to be discharged from hospital with hospice services for metastatic cancer.   Inquired as to if MD will remain PCP. Advised MD will continue to be primary, but hospice medical director can take over pain management.   MD to be made aware.

## 2019-08-20 LAB — GLUCOSE, CAPILLARY
Glucose-Capillary: 177 mg/dL — ABNORMAL HIGH (ref 70–99)
Glucose-Capillary: 203 mg/dL — ABNORMAL HIGH (ref 70–99)
Glucose-Capillary: 196 mg/dL — ABNORMAL HIGH (ref 70–99)
Glucose-Capillary: 225 mg/dL — ABNORMAL HIGH (ref 70–99)

## 2019-08-20 MED ORDER — SENNOSIDES-DOCUSATE SODIUM 8.6-50 MG PO TABS
1.0000 | ORAL_TABLET | Freq: Two times a day (BID) | ORAL | Status: DC
  Administered 2019-08-20 – 2019-08-22 (×2): 1 via ORAL
  Filled 2019-08-20 (×4): qty 1

## 2019-08-20 MED ORDER — CLOPIDOGREL BISULFATE 75 MG PO TABS
75.0000 mg | ORAL_TABLET | Freq: Every day | ORAL | Status: DC
  Administered 2019-08-20 – 2019-08-22 (×3): 75 mg via ORAL
  Filled 2019-08-20 (×3): qty 1

## 2019-08-20 NOTE — Progress Notes (Signed)
PROGRESS NOTE    Christine Zavala  [id number] DOB: 1934/05/26 DOA: 08/15/2019 PCP: Alycia Rossetti, MD   Brief Narrative: 86 year old with past medical history significant for dementia, endometrial cancer, colon cancer status post left colectomy, diabetes, DVT, hypertension, hyperlipidemia, CVA presents to the ED for evaluation of altered mental status, generalized weakness and abnormal labs.  Patient was sent to the ED by her PCP for evaluation of leukocytosis and abnormal liver enzymes.  Patient was noted to be confused and she did not know why she is in the hospital. Evaluation in the ED patient was afebrile, white count 19, glucose 305, creatinine 1.3, AST mildly elevated at 51, alkaline phosphatase 263, bilirubin normal.  Ammonia level normal.  Chest x-ray showed bilateral pulmonary nodules, largest of the right lung base.  CT chest showed numerous bilateral solid pulmonary nodules consistent with pulmonary metastatic disease. CT abdomen and pelvis showing numerous heterogeneous hepatic metastasis.  Dilated fluid-filled endometrial canal with a cystic mass within the uterine fundus endometrial canal concerning for recurrence of endometrial or uterine primary neoplasm.   Assessment & Plan:   Principal Problem:   Metastasis ([place]) Active Problems:   Type 2 diabetes mellitus with diabetic chronic kidney disease (HCC)   Generalized weakness   Leukocytosis   Acute metabolic encephalopathy  1-concern for recurrence of endometrial cancer with distant metastasis versus liver metastasis related to colon cancer Discussed with Dr. Harrington Challenger, plan is to proceed with IR evaluation for liver biopsy. Discussed with Daughter family has decide to move forward with hospice care. No further chemo or treatment or Biopsy. I think patient qualify for hospice care due to metastatic cancer, uncurable.  Palliative care consulted for evaluation for hospice facility evaluation.    2-SIRS/possible sepsis: Patient  presented with leukocytosis and tachycardia. CT chest abdomen negative for source of infection. UA: Negative, urine culture no growth. Blood cultures: No growth.  White blood cell trending down If MRSA negative,  discontinue vancomycin Continue with cefepime. Day 5, last dose today.  Unclear source.   Acute metabolic encephalopathy, underlying dementia and: Generalized weakness: Suspect related to malignancy PT OT eval CT head not acute intracranial abnormality Remain confuse.   Hyperglycemia: Non-insulin-dependent diabetes: Continue with sliding scale insulin. Start low-dose Lantus while inpatient  CKD stage III B; monitor renal function. History of CVA: resume Plavix.  Prolonged QT: Normal magnesium. Replete k.   Nutrition Problem: Increased nutrient needs Etiology: acute illness    Signs/Symptoms: estimated needs    Interventions: Ensure Enlive (each supplement provides 350kcal and 20 grams of protein), Prostat, MVI  Estimated body mass index is 44.11 kg/m as calculated from the following:   Height as of this encounter: 5\' 2"  (1.575 m).   Weight as of this encounter: 109.4 kg.   DVT prophylaxis: Holding anticoagulation in case require biopsy Code Status: Full code Family Communication: Discussed with Daughter 3-26.  Disposition Plan:  Patient is from: Home Anticipated d/c date: To be determined, Home when hospice care is arrange.  Barriers to d/c or necessity for inpatient status: home when hospice care is arrange.  Consultants:    Gynecology oncology     Antimicrobials:  Vancomycin and cefepime  Subjective: Alert, no very conversant.  Needs assistance with feeding    Objective: Vitals:   08/19/19 1318 08/19/19 2053 08/20/19 0513 08/20/19 1312  BP: (!) 142/77 118/79 (!) 157/71 137/78  Pulse: 98 86 89 77  Resp: 20 20    Temp: 99.3 F (37.4 C) 98.6 F (37 C) 98.4  F (36.9 C) 98.4 F (36.9 C)  TempSrc: Oral Oral Oral Axillary  SpO2: 96% 96%  96% 94%  Weight:      Height:        Intake/Output Summary (Last 24 hours) at 08/20/2019 1354 Last data filed at 08/20/2019 0546 Gross per 24 hour  Intake 1874.56 ml  Output --  Net 1874.56 ml   Filed Weights   08/15/19 1802 08/16/19 0624  Weight: 114.3 kg 109.4 kg    Examination:  General exam: NAD Respiratory system:  CTA Cardiovascular system: S, 1  S 2 RRR Gastrointestinal system: BS present, soft, nt Central nervous system: alert Extremities: No edema   Data Reviewed: I have personally reviewed following labs and imaging studies  CBC: Recent Labs  Lab 08/15/19 1256 08/15/19 1853 08/16/19 0708 08/17/19 1126 08/18/19 0725  WBC 16.8* 19.0* 16.4* 13.1* 13.2*  NEUTROABS 14,851*  --  13.8*  --   --   HGB 12.6 12.4 11.9* 11.2* 11.3*  HCT 38.5 40.0 39.4 36.8 37.5  MCV 90.2 93.5 94.9 97.6 97.2  PLT 239 258 213 192 [id number]   Basic Metabolic Panel: Recent Labs  Lab 08/15/19 1256 08/15/19 1853 08/16/19 0708 08/17/19 1126 08/18/19 0725  NA 134* 135  --  136 138  K 3.8 3.8  --  3.8 4.2  CL 97* 100  --  107 110  CO2 22 22  --  22 20*  GLUCOSE 283* 305*  --  244* 216*  BUN 23 28*  --  27* 31*  CREATININE 1.22* 1.33*  --  1.41* 1.13*  CALCIUM 8.6 8.6*  --  8.1* 8.3*  MG  --   --  2.2  --   --    GFR: Estimated Creatinine Clearance: 43.9 mL/min (A) (by C-G formula based on SCr of 1.13 mg/dL (H)). Liver Function Tests: Recent Labs  Lab 08/15/19 1256 08/15/19 1853  AST 52* 51*  ALT 26 31  ALKPHOS  --  263*  BILITOT 0.9 0.8  PROT 6.0* 6.7  ALBUMIN  --  2.8*   Recent Labs  Lab 08/15/19 1256  LIPASE 11   Recent Labs  Lab 08/15/19 2223  AMMONIA 13   Coagulation Profile: No results for input(s): INR, PROTIME in the last 168 hours. Cardiac Enzymes: No results for input(s): CKTOTAL, CKMB, CKMBINDEX, TROPONINI in the last 168 hours. BNP (last 3 results) No results for input(s): PROBNP in the last 8760 hours. HbA1C: No results for input(s): HGBA1C in the  last 72 hours. CBG: Recent Labs  Lab 08/19/19 1113 08/19/19 1657 08/19/19 2045 08/20/19 0800 08/20/19 1124  GLUCAP 159* 227* 231* 177* 203*   Lipid Profile: No results for input(s): CHOL, HDL, LDLCALC, TRIG, CHOLHDL, LDLDIRECT in the last 72 hours. Thyroid Function Tests: No results for input(s): TSH, T4TOTAL, FREET4, T3FREE, THYROIDAB in the last 72 hours. Anemia Panel: No results for input(s): VITAMINB12, FOLATE, FERRITIN, TIBC, IRON, RETICCTPCT in the last 72 hours. Sepsis Labs: Recent Labs  Lab 08/15/19 2223 08/16/19 0708  PROCALCITON  --  1.94  LATICACIDVEN 1.7  --     Recent Results (from the past 240 hour(s))  SARS-COV-2 RNA,(COVID-19) QUAL NAAT     Status: None   Collection Time: 08/15/19 12:52 PM   Specimen: Respiratory  Result Value Ref Range Status   SARS CoV2 RNA NOT DETECTED NOT DETECT Final    Comment: . A Not Detected (negative) test result for this test means that SARS- CoV-2 RNA was not present in  the specimen above the limit of detection. A negative result does not rule out the possibility of COVID-19 and should not be used as the sole basis for treatment or patient management  decisions.  If COVID-19 is still suspected, based on  exposure history together with other clinical findings, re-testing should be considered in consultation with public health authorities. Laboratory test results should always be considered in the context of clinical  observations and epidemiological data in making a final diagnosis and patient management decisions. . Please review the "Fact Sheets" and FDA authorized labeling available for health care providers and patients using the following websites: https://www.[facility].com/home/Covid-19/HCP/NAAT/fact-sheet2  https://www.[facility].com/home/Covid-19/Patients/NAAT/ fact-sheet2 . This test has been authorized by the F DA under an  Emergency Use Authorization (EUA) for use by  authorized laboratories. . Due to the current public health emergency, [facility] is receiving a high volume of samples from a wide variety of swabs and media for COVID-19 testing. In order to serve patients during this public health crisis, samples from appropriate clinical sources are  being tested. Negative test results derived from specimens received in non-commercially manufactured viral collection and transport media, or in media and sample collection kits not yet authorized by FDA for COVID-19 testing should be cautiously evaluated and the patient potentially subjected to extra precautions such as additional clinical monitoring, including collection of an additional specimen. . Methodology:  Nucleic Acid Amplification Test (NAAT) includes RT-PCR or TMA . Additional information about COVID-19 can be found at the [employer] website: www.[facility].com/Covid19.   Urine Culture     Status: Abnormal   Collection Time: 08/15/19  1:11 PM   Specimen: Urine  Result Value Ref Range Status   MICRO NUMBER: [id number]  Final   SPECIMEN QUALITY: Adequate  Final   Sample Source URINE  Final   STATUS: FINAL  Final   ISOLATE 1: Coagulase negative staphylococcus, not S. (A)  Final    Comment: 50,000-100,000 CFU/mL of Coagulase negative staphylococcus, not S. saprophyticus May represent colonizers from external and internal genitalia. No further testing (including susceptibility) will be performed.  Blood culture (routine x 2)     Status: None (Preliminary result)   Collection Time: 08/15/19 11:00 PM   Specimen: BLOOD RIGHT ARM  Result Value Ref Range Status   Specimen Description BLOOD RIGHT ARM  Final   Special Requests   Final    BOTTLES DRAWN AEROBIC AND ANAEROBIC Blood Culture results may not be optimal due to an inadequate volume of blood received in culture bottles Performed at [facility], [place] [address], [place], [place] [address]     Culture   Final    NO GROWTH 3 DAYS Performed at [facility], [place] [address] [place], [place] [address]    Report Status PENDING  Incomplete  SARS CORONAVIRUS 2 (TAT 6-24 HRS) Nasopharyngeal Nasopharyngeal Swab     Status: None   Collection Time: 08/16/19  2:30 AM   Specimen: Nasopharyngeal Swab  Result Value Ref Range Status   SARS Coronavirus 2 NEGATIVE NEGATIVE Final    Comment: (NOTE) SARS-CoV-2 target nucleic acids are NOT DETECTED. The SARS-CoV-2 RNA is generally detectable in upper and lower respiratory specimens during the acute phase of infection. Negative results do not preclude SARS-CoV-2 infection, do not rule out co-infections with other pathogens, and should not be used as the sole basis for treatment or other patient management decisions. Negative results must be combined with clinical observations, patient history, and epidemiological information. The expected result  is Negative. Fact Sheet for Patients: SugarRoll.be Fact Sheet for Healthcare Providers: https://www.woods-mathews.com/ This test is not yet approved or cleared by the [place] FDA and  has been authorized for detection and/or diagnosis of SARS-CoV-2 by FDA under an Emergency Use Authorization (EUA). This EUA will remain  in effect (meaning this test can be used) for the duration of the COVID-19 declaration under Section 56 4(b)(1) of the Act, 21 U.S.C. section 360bbb-3(b)(1), unless the authorization is terminated or revoked sooner. Performed at [facility], [place] [address], [place], [place] [address]   Blood culture (routine x 2)     Status: None (Preliminary result)   Collection Time: 08/16/19  7:08 AM   Specimen: BLOOD  Result Value Ref Range Status   Specimen Description   Final    BLOOD LEFT ANTECUBITAL Performed at [place] [address] [place], [place] [address]    Special Requests   Final    BOTTLES  DRAWN AEROBIC AND ANAEROBIC Blood Culture adequate volume Performed at [place] [address], [place], [place] [address]    Culture   Final    NO GROWTH 3 DAYS Performed at [facility], [place] [address] [place], [place] [address]    Report Status PENDING  Incomplete  Urine culture     Status: None   Collection Time: 08/16/19 11:54 AM   Specimen: Urine, Catheterized  Result Value Ref Range Status   Specimen Description   Final    URINE, CATHETERIZED Performed at [place] [address], [place], [place] [address]    Special Requests   Final    NONE Performed at [facility], [place] [address], [place], [place] [address]    Culture   Final    NO GROWTH Performed at [facility], [place] [address], [place], [place] [address]    Report Status 08/17/2019 FINAL  Final  MRSA PCR Screening     Status: None   Collection Time: 08/17/19  4:40 PM   Specimen: Nasopharyngeal  Result Value Ref Range Status   MRSA by PCR NEGATIVE NEGATIVE Final    Comment:        The GeneXpert MRSA Assay (FDA approved for NASAL specimens only), is one component of a comprehensive MRSA colonization surveillance program. It is not intended to diagnose MRSA infection nor to guide or monitor treatment for MRSA infections. Performed at [facility], [place] [address], [place], [place] [address]          Radiology Studies: No results found.      Scheduled Meds: . clopidogrel  75 mg Oral Daily  . feeding supplement (ENSURE ENLIVE)  237 mL Oral Q24H  . feeding supplement (PRO-STAT SUGAR FREE 64)  30 mL Oral BID  . insulin aspart  0-5 Units Subcutaneous QHS  . insulin aspart  0-9 Units Subcutaneous TID WC  . insulin glargine  5 Units Subcutaneous QHS  . memantine  14 mg Oral QHS  . mirabegron ER  50 mg Oral Daily  . multivitamin with minerals  1 tablet Oral Daily  . pravastatin  80 mg Oral Daily  .  senna-docusate  1 tablet Oral BID   Continuous Infusions: . sodium chloride 75 mL/hr at 08/20/19 0233  . ceFEPime (MAXIPIME) IV 2 g (08/20/19 0849)     LOS: 4 days    Time spent: 35 minutes    Tsering Leaman A Pearlina Friedly, MD [facility]   If 7PM-7AM, please contact night-coverage [email or web address]  08/20/2019, 1:54 PM

## 2019-08-20 NOTE — Plan of Care (Signed)
  Problem: Education: Goal: Knowledge of General Education information will improve Description Including pain rating scale, medication(s)/side effects and non-pharmacologic comfort measures Outcome: Progressing   

## 2019-08-20 NOTE — Plan of Care (Signed)

## 2019-08-20 NOTE — Progress Notes (Signed)
Management consultant Liaison: RN note  Received request from Transition of Care Manager for [employer]. Family request for hospice services at home after discharge. Chart and patient information under review by [facility] physician. Hospice eligibility pending at this time.   Spoke with daughter [name] along side Dr Domingo Cocking to initiate education related to hospice philosophy, services, and team approach to care. Daughter verbalized understanding of information given. Per discussion, plan for discharge home by EMS on Monday 08/22/2019.   Please send signed and completed DNR home with patient/family.   DME needs discussed. Patient currently has no equipment. Daughter [name] request hospital bed, [facility], bedside table, and  transport wheelchair for the delivery to the home. [facility] equipment specialist will contact [employer] to arrange delivery to the home. Address has been verified and is correct in the chart. [name] is the family contact to arrange time of equipment delivery.   [facility] Referral center aware of the above. Please fax completed discharge summary to [phone] when final. Please notify hospital liaison or call [phone] when patient is ready to leave the unit.   Please call with hospice related questions.  Thank you for this referral.  Clementeen Hoof, RN BSN [facility] Liaison [phone]

## 2019-08-20 NOTE — Progress Notes (Addendum)
Palliative care progress note  Reason for visit: GOC in light of metastatic disease  I met today with Ms. Gasior's daughter in conjunction with liaisons from [employer].  We discussed clinical course as well as wishes moving forward in regard to care plan in light of her mother's newly discovered widely metastatic disease.  We discussed difference between a aggressive medical intervention path and a palliative, comfort focused care path.  Values and goals of care important to patient and family were attempted to be elicited.  Concept of hospice discussed, including care venues of home, long term care, and residential hospice.  Discussed that at this point, she is probably more appropriate for home hospice as prognosis is likely weeks to months and she is not currently candidate for residential hospice facility.   Answered questions regarding hospice care at home in conjunction with [facility] liaisons.  - Plan for transition home with hospice services on Monday. - D/w Dr. Tyrell Antonio.  Questions and concerns addressed.   PMT will continue to support holistically.  Start time: 1300 End time: 1410 Total time: 70  Greater than 50%  of this time was spent counseling and coordinating care related to the above assessment and plan.  Micheline Rough, MD [place] Team [phone]

## 2019-08-21 DIAGNOSIS — C801 Malignant (primary) neoplasm, unspecified: Secondary | ICD-10-CM

## 2019-08-21 LAB — GLUCOSE, CAPILLARY
Glucose-Capillary: 181 mg/dL — ABNORMAL HIGH (ref 70–99)
Glucose-Capillary: 224 mg/dL — ABNORMAL HIGH (ref 70–99)
Glucose-Capillary: 139 mg/dL — ABNORMAL HIGH (ref 70–99)
Glucose-Capillary: 116 mg/dL — ABNORMAL HIGH (ref 70–99)

## 2019-08-21 LAB — CULTURE, BLOOD (ROUTINE X 2)
Culture: NO GROWTH
Culture: NO GROWTH
Special Requests: ADEQUATE

## 2019-08-21 LAB — BASIC METABOLIC PANEL
Anion gap: 7 (ref 5–15)
BUN: 38 mg/dL — ABNORMAL HIGH (ref 8–23)
CO2: 19 mmol/L — ABNORMAL LOW (ref 22–32)
Calcium: 8.7 mg/dL — ABNORMAL LOW (ref 8.9–10.3)
Chloride: 113 mmol/L — ABNORMAL HIGH (ref 98–111)
Creatinine, Ser: 1.03 mg/dL — ABNORMAL HIGH (ref 0.44–1.00)
GFR calc Af Amer: 58 mL/min — ABNORMAL LOW (ref >60)
GFR calc non Af Amer: 50 mL/min — ABNORMAL LOW (ref >60)
Glucose, Bld: 203 mg/dL — ABNORMAL HIGH (ref 70–99)
Potassium: 3.9 mmol/L (ref 3.5–5.1)
Sodium: 139 mmol/L (ref 135–145)

## 2019-08-21 MED ORDER — LORAZEPAM 2 MG/ML PO CONC
1.0000 mg | ORAL | Status: DC | PRN
  Filled 2019-08-21: qty 0.5

## 2019-08-21 MED ORDER — HALOPERIDOL LACTATE 2 MG/ML PO CONC
0.5000 mg | ORAL | Status: DC | PRN
  Filled 2019-08-21: qty 0.3

## 2019-08-21 MED ORDER — ATROPINE SULFATE 1 % OP SOLN
3.0000 [drp] | OPHTHALMIC | Status: DC | PRN
  Filled 2019-08-21: qty 2

## 2019-08-21 MED ORDER — MORPHINE SULFATE (CONCENTRATE) 10 MG/0.5ML PO SOLN: 5.0000 mg | ORAL | Status: DC | PRN

## 2019-08-21 NOTE — Progress Notes (Signed)
Daily Progress Note   Patient Name: Christine Zavala       Date: 08/21/2019 DOB: February 08, 1934  Age: 86 y.o. MRN#: [id number] Attending Physician: Elmarie Shiley, MD Primary Care Physician: Alycia Rossetti, MD Admit Date: 08/15/2019  Reason for Consultation/Follow-up: Establishing goals of care  Subjective: I saw and examined Ms. Dicesare.  Primary goal today was symptom check for needs when she discharges home with hospice.  She denies pain, SOB, or nausea at this time.   No family at bedside today.  Length of Stay: 5  Current Medications: Scheduled Meds:  . clopidogrel  75 mg Oral Daily  . feeding supplement (ENSURE ENLIVE)  237 mL Oral Q24H  . feeding supplement (PRO-STAT SUGAR FREE 64)  30 mL Oral BID  . insulin aspart  0-5 Units Subcutaneous QHS  . insulin aspart  0-9 Units Subcutaneous TID WC  . insulin glargine  5 Units Subcutaneous QHS  . memantine  14 mg Oral QHS  . mirabegron ER  50 mg Oral Daily  . multivitamin with minerals  1 tablet Oral Daily  . pravastatin  80 mg Oral Daily  . senna-docusate  1 tablet Oral BID    Continuous Infusions: . sodium chloride 75 mL/hr at 08/21/19 0842  . ceFEPime (MAXIPIME) IV 2 g (08/21/19 1104)    PRN Meds: acetaminophen **OR** acetaminophen, HYDROcodone-acetaminophen, morphine injection  Physical Exam         General: Sleepy but arousable  Heart: Tachycardic. No murmur appreciated. Lungs: Good air movement, clear Abdomen: Soft, nontender, nondistended, positive bowel sounds.  Ext: No significant edema Skin: Warm and dry  Vital Signs: BP (!) 151/74 (BP Location: Right Arm)   Pulse (!) 103   Temp 98.3 F (36.8 C) (Oral)   Resp 17   Ht _0  (1.575 m)   Wt 109.4 kg   SpO2 96%   BMI 44.11 kg/m  SpO2: SpO2: 96 % O2 Device:  O2 Device: Room Air O2 Flow Rate:    Intake/output summary:   Intake/Output Summary (Last 24 hours) at 08/21/2019 1401 Last data filed at 08/21/2019 0600 Gross per 24 hour  Intake 1977.5 ml  Output --  Net 1977.5 ml   LBM:   Baseline Weight: Weight: 114.3 kg Most recent weight: Weight: 109.4 kg       Palliative  Assessment/Data:    Flowsheet Rows     Most Recent Value  Intake Tab  Referral Department  Hospitalist  Unit at Time of Referral  Oncology Unit  Palliative Care Primary Diagnosis  Cancer  Date Notified  08/19/19  Palliative Care Type  New Palliative care  Reason for referral  Clarify Goals of Care  Date of Admission  08/15/19  Date first seen by Palliative Care  08/19/19  # of days Palliative referral response time  0 Day(s)  # of days IP prior to Palliative referral  4  Clinical Assessment  Palliative Performance Scale Score  30%  Psychosocial & Spiritual Assessment  Palliative Care Outcomes  Patient/Family meeting held?  Yes  Who was at the meeting?  Daughter      Patient Active Problem List   Diagnosis Date Noted  . Metastasis ([place]) 08/16/2019  . Leukocytosis 08/16/2019  . Acute metabolic encephalopathy 54/49/2010  . Endometrial adenocarcinoma ([place]) 01/24/2019  . Morbid obesity with BMI of 45.0-49.9, adult ([place]) 01/24/2019  . History of colon cancer 01/24/2019  . History of DVT (deep vein thrombosis) 01/24/2019  . Abnormal uterine bleeding (AUB) 01/24/2019  . CKD stage 3 due to type 2 diabetes mellitus ([place]) 05/04/2018  . Acute on chronic renal failure ([place]) 10/22/2017  . Generalized weakness 05/16/2016  . Vertigo 06/27/2015  . Abnormality of gait 04/12/2015  . Constipation 08/21/2014  . Itchy scalp 08/21/2014  . Osteoarthritis 03/06/2014  . Peripheral neuropathy 03/06/2014  . Varicose veins 03/06/2014  . DDD (degenerative disc disease), lumbar 11/29/2013  . Renal cyst 11/29/2013  . OAB (overactive bladder) 08/24/2013  . Osteoporosis,  unspecified   . Peripheral edema 11/15/2012  . History of rectal cancer   . Hyperlipidemia   . Dementia ([place])   . History of CVA (cerebrovascular accident)   . Hypertension   . Type 2 diabetes mellitus with diabetic chronic kidney disease [facility])     Palliative Care Assessment & Plan   Patient Profile:  86 year old female with past medical history of dementia, endometrial cancer, colon cancer status post left colectomy, diabetes, DVT, hypertension, hyperlipidemia, CVA who presented to the ED with altered mental status weakness and abnormal labs after being seen by her PCP.  Work-up in the ED revealed CT with numerous hepatic masses and numerous bilateral solid pulmonary to my nodules consistent with pulmonary metastatic disease.  She met SIRS criteria and was a started on empiric antibiotics.  She was evaluated by Dr. Denman George with Gyn Onc and recommendation was for biopsy if further evaluation of cancer and potential treatment options was desired.  Following discussion, family elected to forego further work-up.  They met with hospice for discussion regarding potential home with hospice but they were noted to have questions regarding her prognosis as well as care plan moving forward.  Palliative care consulted to assist with goals of care and hospice evaluation.  Recommendations/Plan:  Plan for transition home with hospice on 3/29. [facility] setting up equipment delivery prior to discharge.  Durable DNR placed on chart. On discharge, would recommend scripts for: - Morphine Concentrate 3m/0.5ml: 557m(0.2553msublingual every 2 hours as needed for pain or shortness of breath: Disp 78m61mLorazepam 2mg/83mconcentrated solution: 1mg (76mml) s51mingual every 4 hours as needed for anxiety: Disp 78ml - 31mol 2mg/ml s50mtion: 0.5mg (0.2519m subl27mal every 4 hours as needed for agitation or nausea: Disp 78ml    Cod57matus:    Code Status Orders  (From admission, onward)  Start      Ordered   08/20/19 1350  Do not attempt resuscitation (DNR)  Continuous    Question Answer Comment  In the event of cardiac or respiratory ARREST Do not call a "code blue"   In the event of cardiac or respiratory ARREST Do not perform Intubation, CPR, defibrillation or ACLS   In the event of cardiac or respiratory ARREST Use medication by any route, position, wound care, and other measures to relive pain and suffering. May use oxygen, suction and manual treatment of airway obstruction as needed for comfort.      08/20/19 1350        Code Status History    Date Active Date Inactive Code Status Order ID Comments User Context   08/16/2019 0411 08/20/2019 1350 Full Code [id number]  Shela Leff, MD ED   02/17/2019 0904 02/17/2019 1625 Full Code [id number]  Dorothyann Gibbs, NP Inpatient   10/22/2017 2035 10/24/2017 1912 Full Code [id number]  Alphonzo Grieve, MD ED   05/16/2016 1600 05/20/2016 1922 Full Code [id number]  Radene Gunning, NP ED   Advance Care Planning Activity       Prognosis:   < 6 months  Discharge Planning:  Home with Hospice  Care plan was discussed with RN  Thank you for allowing the Palliative Medicine Team to assist in the care of this patient.   Time In: 1340 Time Out: 1400 Total Time 20 Prolonged Time Billed No      Greater than 50%  of this time was spent counseling and coordinating care related to the above assessment and plan.  Micheline Rough, MD  Please contact Palliative Medicine Team phone at [phone] for questions and concerns.

## 2019-08-21 NOTE — Progress Notes (Signed)
PROGRESS NOTE    Christine Zavala  [id number] DOB: [birth year] DOA: 08/15/2019 PCP: Alycia Rossetti, MD   Brief Narrative: 86 year old with past medical history significant for dementia, endometrial cancer, colon cancer status post left colectomy, diabetes, DVT, hypertension, hyperlipidemia, CVA presents to the ED for evaluation of altered mental status, generalized weakness and abnormal labs.  Patient was sent to the ED by her PCP for evaluation of leukocytosis and abnormal liver enzymes.  Patient was noted to be confused and she did not know why she is in the hospital. Evaluation in the ED patient was afebrile, white count 19, glucose 305, creatinine 1.3, AST mildly elevated at 51, alkaline phosphatase 263, bilirubin normal.  Ammonia level normal.  Chest x-ray showed bilateral pulmonary nodules, largest of the right lung base.  CT chest showed numerous bilateral solid pulmonary nodules consistent with pulmonary metastatic disease. CT abdomen and pelvis showing numerous heterogeneous hepatic metastasis.  Dilated fluid-filled endometrial canal with a cystic mass within the uterine fundus endometrial canal concerning for recurrence of endometrial or uterine primary neoplasm.   Assessment & Plan:   Principal Problem:   Metastasis ([place]) Active Problems:   Type 2 diabetes mellitus with diabetic chronic kidney disease (HCC)   Generalized weakness   Leukocytosis   Acute metabolic encephalopathy  1-Concern for recurrence of endometrial cancer with distant metastasis versus liver metastasis related to colon cancer Discussed with Dr. Harrington Challenger, plan is to proceed with IR evaluation for liver biopsy. Discussed with Daughter family has decide to move forward with hospice care. No further chemo or treatment or Biopsy. I think patient qualify for hospice care due to metastatic cancer, uncurable.  Palliative care consulted for evaluation for hospice facility evaluation.  Plan to discharge home with hospice on  Monday   2-SIRS/possible sepsis: Patient presented with leukocytosis and tachycardia. CT chest abdomen negative for source of infection. UA: Negative, urine culture no growth. Blood cultures: No growth.  White blood cell trending down If MRSA negative,  discontinue vancomycin Treated  with cefepime. Completed 5 days.  Unclear source.   Acute metabolic encephalopathy, underlying dementia and: Generalized weakness: Suspect related to malignancy PT OT eval CT head not acute intracranial abnormality Remain confuse.   Hyperglycemia: Non-insulin-dependent diabetes: Continue with sliding scale insulin. Start low-dose Lantus while inpatient  CKD stage III B; monitor renal function. History of CVA: resume Plavix.  Prolonged QT: Normal magnesium. Replete k.   Nutrition Problem: Increased nutrient needs Etiology: acute illness    Signs/Symptoms: estimated needs    Interventions: Ensure Enlive (each supplement provides 350kcal and 20 grams of protein), Prostat, MVI  Estimated body mass index is 44.11 kg/m as calculated from the following:   Height as of this encounter: 5\' 2"  (1.575 m).   Weight as of this encounter: 109.4 kg.   DVT prophylaxis: Holding anticoagulation in case require biopsy Code Status: Full code Family Communication: Discussed with Daughter 3-26.  Disposition Plan:  Patient is from: Home Anticipated d/c date: To be determined, Home when hospice care is arrange.  Barriers to d/c or necessity for inpatient status: home when hospice care is arrange.  Consultants:    Gynecology oncology     Antimicrobials:  Vancomycin and cefepime  Subjective: Alert, answer no to every questions. Confuse. Poor oral intake today    Objective: Vitals:   08/20/19 1312 08/20/19 2018 08/21/19 0528 08/21/19 1244  BP: 137/78 (!) 143/70 (!) 161/76 (!) 151/74  Pulse: 77 83 82 (!) 103  Resp:  19 19 17  Temp: 98.4 F (36.9 C) 98.1 F (36.7 C) 98.8 F (37.1 C) 98.3 F  (36.8 C)  TempSrc: Axillary Oral Oral Oral  SpO2: 94% 97% 97% 96%  Weight:      Height:        Intake/Output Summary (Last 24 hours) at 08/21/2019 1407 Last data filed at 08/21/2019 0600 Gross per 24 hour  Intake 1977.5 ml  Output --  Net 1977.5 ml   Filed Weights   08/15/19 1802 08/16/19 0624  Weight: 114.3 kg 109.4 kg    Examination:  General exam: NAD Respiratory system:  CTA Cardiovascular system: S 1 , S 2 RRR Gastrointestinal system: BS present, soft. nt Central nervous system: ALert Extremities: No edema   Data Reviewed: I have personally reviewed following labs and imaging studies  CBC: Recent Labs  Lab 08/15/19 1256 08/15/19 1853 08/16/19 0708 08/17/19 1126 08/18/19 0725  WBC 16.8* 19.0* 16.4* 13.1* 13.2*  NEUTROABS 14,851*  --  13.8*  --   --   HGB 12.6 12.4 11.9* 11.2* 11.3*  HCT 38.5 40.0 39.4 36.8 37.5  MCV 90.2 93.5 94.9 97.6 97.2  PLT 239 258 213 192 [id number]   Basic Metabolic Panel: Recent Labs  Lab 08/15/19 1256 08/15/19 1853 08/16/19 0708 08/17/19 1126 08/18/19 0725 08/21/19 0811  NA 134* 135  --  136 138 139  K 3.8 3.8  --  3.8 4.2 3.9  CL 97* 100  --  107 110 113*  CO2 22 22  --  22 20* 19*  GLUCOSE 283* 305*  --  244* 216* 203*  BUN 23 28*  --  27* 31* 38*  CREATININE 1.22* 1.33*  --  1.41* 1.13* 1.03*  CALCIUM 8.6 8.6*  --  8.1* 8.3* 8.7*  MG  --   --  2.2  --   --   --    GFR: Estimated Creatinine Clearance: 48.2 mL/min (A) (by C-G formula based on SCr of 1.03 mg/dL (H)). Liver Function Tests: Recent Labs  Lab 08/15/19 1256 08/15/19 1853  AST 52* 51*  ALT 26 31  ALKPHOS  --  263*  BILITOT 0.9 0.8  PROT 6.0* 6.7  ALBUMIN  --  2.8*   Recent Labs  Lab 08/15/19 1256  LIPASE 11   Recent Labs  Lab 08/15/19 2223  AMMONIA 13   Coagulation Profile: No results for input(s): INR, PROTIME in the last 168 hours. Cardiac Enzymes: No results for input(s): CKTOTAL, CKMB, CKMBINDEX, TROPONINI in the last 168 hours. BNP (last  3 results) No results for input(s): PROBNP in the last 8760 hours. HbA1C: No results for input(s): HGBA1C in the last 72 hours. CBG: Recent Labs  Lab 08/20/19 1124 08/20/19 1635 08/20/19 2015 08/21/19 0730 08/21/19 1139  GLUCAP 203* 196* 225* 181* 224*   Lipid Profile: No results for input(s): CHOL, HDL, LDLCALC, TRIG, CHOLHDL, LDLDIRECT in the last 72 hours. Thyroid Function Tests: No results for input(s): TSH, T4TOTAL, FREET4, T3FREE, THYROIDAB in the last 72 hours. Anemia Panel: No results for input(s): VITAMINB12, FOLATE, FERRITIN, TIBC, IRON, RETICCTPCT in the last 72 hours. Sepsis Labs: Recent Labs  Lab 08/15/19 2223 08/16/19 0708  PROCALCITON  --  1.94  LATICACIDVEN 1.7  --     Recent Results (from the past 240 hour(s))  SARS-COV-2 RNA,(COVID-19) QUAL NAAT     Status: None   Collection Time: 08/15/19 12:52 PM   Specimen: Respiratory  Result Value Ref Range Status   SARS CoV2 RNA NOT DETECTED NOT DETECT Final  Comment: . A Not Detected (negative) test result for this test means that SARS- CoV-2 RNA was not present in the specimen above the limit of detection. A negative result does not rule out the possibility of COVID-19 and should not be used as the sole basis for treatment or patient management  decisions.  If COVID-19 is still suspected, based on  exposure history together with other clinical findings, re-testing should be considered in consultation with public health authorities. Laboratory test results should always be considered in the context of clinical  observations and epidemiological data in making a final diagnosis and patient management decisions. . Please review the "Fact Sheets" and FDA authorized labeling available for health care providers and patients using the following websites: https://www.[facility].com/home/Covid-19/HCP/NAAT/fact-sheet2  https://www.[facility].com/home/Covid-19/Patients/NAAT/ fact-sheet2 . This test  has been authorized by the F DA under an  Emergency Use Authorization (EUA) for use by authorized laboratories. . Due to the current public health emergency, [facility] is receiving a high volume of samples from a wide variety of swabs and media for COVID-19 testing. In order to serve patients during this public health crisis, samples from appropriate clinical sources are  being tested. Negative test results derived from specimens received in non-commercially manufactured viral collection and transport media, or in media and sample collection kits not yet authorized by FDA for COVID-19 testing should be cautiously evaluated and the patient potentially subjected to extra precautions such as additional clinical monitoring, including collection of an additional specimen. . Methodology:  Nucleic Acid Amplification Test (NAAT) includes RT-PCR or TMA . Additional information about COVID-19 can be found at the [employer] website: www.[facility].com/Covid19.   Urine Culture     Status: Abnormal   Collection Time: 08/15/19  1:11 PM   Specimen: Urine  Result Value Ref Range Status   MICRO NUMBER: [id number]  Final   SPECIMEN QUALITY: Adequate  Final   Sample Source URINE  Final   STATUS: FINAL  Final   ISOLATE 1: Coagulase negative staphylococcus, not S. (A)  Final    Comment: 50,000-100,000 CFU/mL of Coagulase negative staphylococcus, not S. saprophyticus May represent colonizers from external and internal genitalia. No further testing (including susceptibility) will be performed.  Blood culture (routine x 2)     Status: None   Collection Time: 08/15/19 11:00 PM   Specimen: BLOOD RIGHT ARM  Result Value Ref Range Status   Specimen Description BLOOD RIGHT ARM  Final   Special Requests   Final    BOTTLES DRAWN AEROBIC AND ANAEROBIC Blood Culture results may not be optimal due to an inadequate volume of blood received in culture bottles Performed at [facility], [place] [address] [place], [place] [address]    Culture   Final    NO GROWTH 5 DAYS Performed at [facility], [place] [address], [place], [place] [address]    Report Status 08/21/2019 FINAL  Final  SARS CORONAVIRUS 2 (TAT 6-24 HRS) Nasopharyngeal Nasopharyngeal Swab     Status: None   Collection Time: 08/16/19  2:30 AM   Specimen: Nasopharyngeal Swab  Result Value Ref Range Status   SARS Coronavirus 2 NEGATIVE NEGATIVE Final    Comment: (NOTE) SARS-CoV-2 target nucleic acids are NOT DETECTED. The SARS-CoV-2 RNA is generally detectable in upper and lower respiratory specimens during the acute phase of infection. Negative results do not preclude SARS-CoV-2 infection, do not rule out co-infections with other pathogens, and should not be used as the sole basis for treatment or other  patient management decisions. Negative results must be combined with clinical observations, patient history, and epidemiological information. The expected result is Negative. Fact Sheet for Patients: SugarRoll.be Fact Sheet for Healthcare Providers: https://www.woods-mathews.com/ This test is not yet approved or cleared by the [place] FDA and  has been authorized for detection and/or diagnosis of SARS-CoV-2 by FDA under an Emergency Use Authorization (EUA). This EUA will remain  in effect (meaning this test can be used) for the duration of the COVID-19 declaration under Section 56 4(b)(1) of the Act, 21 U.S.C. section 360bbb-3(b)(1), unless the authorization is terminated or revoked sooner. Performed at [facility], [place] [address], [place], [place] [address]   Blood culture (routine x 2)     Status: None   Collection Time: 08/16/19  7:08 AM   Specimen: BLOOD  Result Value Ref Range Status   Specimen Description   Final    BLOOD LEFT ANTECUBITAL Performed at [facility] [address], [place], [place]  [address]    Special Requests   Final    BOTTLES DRAWN AEROBIC AND ANAEROBIC Blood Culture adequate volume Performed at [facility] [address] [place], [place] [address]    Culture   Final    NO GROWTH 5 DAYS Performed at [facility], [place] [address], [place], [place] [address]    Report Status 08/21/2019 FINAL  Final  Urine culture     Status: None   Collection Time: 08/16/19 11:54 AM   Specimen: Urine, Catheterized  Result Value Ref Range Status   Specimen Description   Final    URINE, CATHETERIZED Performed at [facility], [place] [address], [place], [place] [address]    Special Requests   Final    NONE Performed at [facility], [place] [address], [place], [place] [address]    Culture   Final    NO GROWTH Performed at [facility], [place] [address], [place], [place] [address]    Report Status 08/17/2019 FINAL  Final  MRSA PCR Screening     Status: None   Collection Time: 08/17/19  4:40 PM   Specimen: Nasopharyngeal  Result Value Ref Range Status   MRSA by PCR NEGATIVE NEGATIVE Final    Comment:        The GeneXpert MRSA Assay (FDA approved for NASAL specimens only), is one component of a comprehensive MRSA colonization surveillance program. It is not intended to diagnose MRSA infection nor to guide or monitor treatment for MRSA infections. Performed at [facility], [place] [address], [place], [place] [address]          Radiology Studies: No results found.      Scheduled Meds: . clopidogrel  75 mg Oral Daily  . feeding supplement (ENSURE ENLIVE)  237 mL Oral Q24H  . feeding supplement (PRO-STAT SUGAR FREE 64)  30 mL Oral BID  . insulin aspart  0-5 Units Subcutaneous QHS  . insulin aspart  0-9 Units Subcutaneous TID WC  . insulin glargine  5 Units Subcutaneous QHS  . memantine  14 mg Oral QHS  . mirabegron ER  50 mg Oral Daily  . multivitamin with minerals  1 tablet  Oral Daily  . pravastatin  80 mg Oral Daily  . senna-docusate  1 tablet Oral BID   Continuous Infusions: . sodium chloride 75 mL/hr at 08/21/19 0842  . ceFEPime (MAXIPIME) IV 2 g (08/21/19 1104)  LOS: 5 days    Time spent: 35 minutes    Jaycelyn Orrison A Sadako Cegielski, MD [facility]   If 7PM-7AM, please contact night-coverage [email or web address]  08/21/2019, 2:07 PM

## 2019-08-22 LAB — GLUCOSE, CAPILLARY
Glucose-Capillary: 117 mg/dL — ABNORMAL HIGH (ref 70–99)
Glucose-Capillary: 151 mg/dL — ABNORMAL HIGH (ref 70–99)

## 2019-08-22 MED ORDER — SENNOSIDES-DOCUSATE SODIUM 8.6-50 MG PO TABS: 1.0000 | ORAL_TABLET | Freq: Two times a day (BID) | ORAL | 0 refills | Status: AC

## 2019-08-22 MED ORDER — HALOPERIDOL LACTATE 2 MG/ML PO CONC: 0.5000 mg | ORAL | 0 refills | Status: AC | PRN

## 2019-08-22 MED ORDER — ATROPINE SULFATE 1 % OP SOLN: 3.0000 [drp] | OPHTHALMIC | 12 refills | Status: AC | PRN

## 2019-08-22 MED ORDER — HYDROCODONE-ACETAMINOPHEN 5-325 MG PO TABS: 1.0000 | ORAL_TABLET | Freq: Four times a day (QID) | ORAL | 0 refills | Status: AC | PRN

## 2019-08-22 NOTE — TOC Progression Note (Signed)
Transition of Care [facility]) - Progression Note    Patient Details  Name: Christine Zavala MRN: [place]:[id number] Date of Birth: [birth year]  Transition of Care [facility]) CM/SW Contact  Purcell Mouton, RN Phone Number: 08/22/2019, 11:44 AM  Clinical Narrative:    Pt discharging home with [facility].    Expected Discharge Plan: Home w Hospice Care Barriers to Discharge: No Barriers Identified  Expected Discharge Plan and Services Expected Discharge Plan: [place]   Discharge Planning Services: CM Consult   Living arrangements for the past 2 months: Single Family Home Expected Discharge Date: 08/22/19                         HH Arranged: PT [place] Agency: [place] ([place]) Date [place]: 08/17/19 Time [place]: [phone] Representative spoke with at [place]: [place] ([place]) Interventions    Readmission Risk Interventions No flowsheet data found.

## 2019-08-22 NOTE — Care Management Important Message (Signed)
Important Message  Patient Details IM Letter given to Gabriel Earing RN Case Manager to present to the Patient Name: Christine Zavala MRN: [id number] Date of Birth: [birth year]   Medicare Important Message Given:  Yes     Kerin Salen 08/22/2019, 11:04 AM

## 2019-08-22 NOTE — Progress Notes (Signed)
[facility] ([facility])  Plan to d/c home today after DME is delivered.  Per dtr, DME to be delivered after noon.  [facility] will update TOC manager once DME arrived so pt can be sent home via [place].  Please send completed DNR home with pt, along with comfort scripts should they be needed-so there is no lapse in her symptoms prior to hospice services being started.  Venia Carbon RN, BSN, [facility] Liaison (in [place]) [phone]

## 2019-08-22 NOTE — TOC Progression Note (Signed)
Transition of Care [facility]) - Progression Note    Patient Details  Name: Christine Zavala MRN: [id number] Date of Birth: 1934/01/11  Transition of Care [facility]) CM/SW Contact  Purcell Mouton, RN Phone Number: 08/22/2019, 12:43 PM  Clinical Narrative:    Spoke with pt's daughter concerning discharge plan to home and transportation. Pt will transport home by [facility]. Waiting for DME to be delivered.    Expected Discharge Plan: Home w Hospice Care Barriers to Discharge: No Barriers Identified  Expected Discharge Plan and Services Expected Discharge Plan: [place]   Discharge Planning Services: CM Consult   Living arrangements for the past 2 months: Single Family Home Expected Discharge Date: 08/22/19                         HH Arranged: PT [place] Agency: [place] ([place]) Date [place]: 08/17/19 Time [place]: [phone] Representative spoke with at [place]: [place] ([place]) Interventions    Readmission Risk Interventions No flowsheet data found.

## 2019-08-22 NOTE — Progress Notes (Signed)
PT Cancellation Note  Patient Details Name: Christine Zavala MRN: [id number] DOB: 03-Sep-1933   Cancelled Treatment:    Reason Eval/Treat Not Completed: Fatigue/lethargy limiting ability to participate Pt requests to rest and declined OOB.  Pt to d/c home today via PTAR with hospice services per chart review.   Elex Mainwaring,KATHrine E 08/22/2019, 10:14 AM Arlyce Dice, DPT Acute Rehabilitation Services Office: [phone]

## 2019-08-22 NOTE — Progress Notes (Signed)
Daily Progress Note   Patient Name: Christine Zavala       Date: 08/22/2019 DOB: [birth year]  Age: 86 y.o. MRN#: [id number] Christine Physician: Elmarie Shiley, MD Christine Care Physician: Alycia Rossetti, MD Admit Date: 08/15/2019  Reason for Consultation/Follow-up: Establishing goals of care  Subjective: I saw and examined Christine Zavala for symptom check for needs when she discharges home with hospice.  She denies pain, SOB, or nausea at this time.   No family at bedside today.  D/w hospice liaison.  Plan for d/c home with hospice this afternoon once equipment delivered.  Length of Stay: 6  Current Medications: Scheduled Meds:  . clopidogrel  75 mg Oral Daily  . feeding supplement (ENSURE ENLIVE)  237 mL Oral Q24H  . feeding supplement (PRO-STAT SUGAR FREE 64)  30 mL Oral BID  . insulin aspart  0-5 Units Subcutaneous QHS  . insulin aspart  0-9 Units Subcutaneous TID WC  . insulin glargine  5 Units Subcutaneous QHS  . memantine  14 mg Oral QHS  . mirabegron ER  50 mg Oral Daily  . multivitamin with minerals  1 tablet Oral Daily  . senna-docusate  1 tablet Oral BID    Continuous Infusions: . sodium chloride 75 mL/hr at 08/21/19 2233    PRN Meds: acetaminophen **OR** acetaminophen, atropine, haloperidol, HYDROcodone-acetaminophen, LORazepam, morphine injection, morphine CONCENTRATE  Physical Exam         General: Sleepy but arousable  Heart: Regular rate. No murmur appreciated. Lungs: Good air movement, clear Abdomen: Soft, nontender, nondistended, positive bowel sounds.  Ext: No significant edema Skin: Warm and dry  Vital Signs: BP (!) 155/74 (BP Location: Right Arm)   Pulse 75   Temp 98.6 F (37 C) (Oral)   Resp 18   Ht _0  (1.575 m)   Wt 109.4 kg   SpO2 98%   BMI  44.11 kg/m  SpO2: SpO2: 98 % O2 Device: O2 Device: Room Air O2 Flow Rate:    Intake/output summary:   Intake/Output Summary (Last 24 hours) at 08/22/2019 1104 Last data filed at 08/22/2019 0500 Gross per 24 hour  Intake 2055 ml  Output --  Net 2055 ml   LBM: Last BM Date: 08/21/19 Baseline Weight: Weight: 114.3 kg Most recent weight: Weight: 109.4 kg  Palliative Assessment/Data:    Flowsheet Rows     Most Recent Value  Intake Tab  Referral Department  Hospitalist  Unit at Time of Referral  Oncology Unit  Palliative Care Christine Diagnosis  Cancer  Date Notified  08/19/19  Palliative Care Type  New Palliative care  Reason for referral  Clarify Goals of Care  Date of Admission  08/15/19  Date first seen by Palliative Care  08/19/19  # of days Palliative referral response time  0 Day(s)  # of days IP prior to Palliative referral  4  Clinical Assessment  Palliative Performance Scale Score  30%  Psychosocial & Spiritual Assessment  Palliative Care Outcomes  Patient/Family meeting held?  Yes  Who was at the meeting?  Christine Zavala      Patient Active Problem List   Diagnosis Date Noted  . Metastasis ([place]) 08/16/2019  . Leukocytosis 08/16/2019  . Acute metabolic encephalopathy 35/57/3220  . Endometrial adenocarcinoma ([place]) 01/24/2019  . Morbid obesity with BMI of 45.0-49.9, adult ([place]) 01/24/2019  . History of colon cancer 01/24/2019  . History of DVT (deep vein thrombosis) 01/24/2019  . Abnormal uterine bleeding (AUB) 01/24/2019  . CKD stage 3 due to type 2 diabetes mellitus ([place]) 05/04/2018  . Acute on chronic renal failure ([place]) 10/22/2017  . Generalized weakness 05/16/2016  . Vertigo 06/27/2015  . Abnormality of gait 04/12/2015  . Constipation 08/21/2014  . Itchy scalp 08/21/2014  . Osteoarthritis 03/06/2014  . Peripheral neuropathy 03/06/2014  . Varicose veins 03/06/2014  . DDD (degenerative disc disease), lumbar 11/29/2013  . Renal cyst 11/29/2013  .  OAB (overactive bladder) 08/24/2013  . Osteoporosis, unspecified   . Peripheral edema 11/15/2012  . History of rectal cancer   . Hyperlipidemia   . Dementia ([place])   . History of CVA (cerebrovascular accident)   . Hypertension   . Type 2 diabetes mellitus with diabetic chronic kidney disease [facility])     Palliative Care Assessment & Plan   Patient Profile:  86 year old female with past medical history of dementia, endometrial cancer, colon cancer status post left colectomy, diabetes, DVT, hypertension, hyperlipidemia, CVA who presented to the ED with altered mental status weakness and abnormal labs after being seen by her PCP.  Work-up in the ED revealed CT with numerous hepatic masses and numerous bilateral solid pulmonary to my nodules consistent with pulmonary metastatic disease.  She met SIRS criteria and was a started on empiric antibiotics.  She was evaluated by Christine Zavala with Gyn Onc and recommendation was for biopsy if further evaluation of cancer and potential treatment options was desired.  Following discussion, family elected to forego further work-up.  They met with hospice for discussion regarding potential home with hospice but they were noted to have questions regarding her prognosis as well as care plan moving forward.  Palliative care consulted to assist with goals of care and hospice evaluation.  Recommendations/Plan:  Plan for transition home with hospice this afternoon once equipment delivered. [facility] setting up equipment delivery prior to discharge.  Durable DNR placed on chart. On discharge, would recommend scripts for: - Morphine Concentrate 69m/0.5ml: 548m(0.2514msublingual every 2 hours as needed for pain or shortness of breath: Disp 23m69mLorazepam 2mg/4mconcentrated solution: 1mg (56mml) s16mingual every 4 hours as needed for anxiety: Disp 23ml - 20mol 2mg/ml s83mtion: 0.5mg (0.2582m subl78mal every 4 hours as needed for agitation or nausea: Disp 23ml     Co16mtatus:    Code Status Orders  (From admission,  onward)         Start     Ordered   08/20/19 1350  Do not attempt resuscitation (DNR)  Continuous    Question Answer Comment  In the event of cardiac or respiratory ARREST Do not call a "code blue"   In the event of cardiac or respiratory ARREST Do not perform Intubation, CPR, defibrillation or ACLS   In the event of cardiac or respiratory ARREST Use medication by any route, position, wound care, and other measures to relive pain and suffering. May use oxygen, suction and manual treatment of airway obstruction as needed for comfort.      08/20/19 1350        Code Status History    Date Active Date Inactive Code Status Order ID Comments User Context   08/16/2019 0411 08/20/2019 1350 Full Code [id number]  Shela Leff, MD ED   02/17/2019 0904 02/17/2019 1625 Full Code [id number]  Dorothyann Gibbs, NP Inpatient   10/22/2017 2035 10/24/2017 1912 Full Code [id number]  Alphonzo Grieve, MD ED   05/16/2016 1600 05/20/2016 1922 Full Code [id number]  Radene Gunning, NP ED   Advance Care Planning Activity       Prognosis:   < 6 months  Discharge Planning:  Home with Hospice  Care plan was discussed with RN, TOC team, hospice liaison  Thank you for allowing the Palliative Medicine Team to assist in the care of this patient.   Time In: 1000 Time Out: 1020 Total Time 20 Prolonged Time Billed No      Greater than 50%  of this time was spent counseling and coordinating care related to the above assessment and plan.  Micheline Rough, MD  Please contact Palliative Medicine Team phone at [phone] for questions and concerns.

## 2019-08-22 NOTE — Discharge Summary (Signed)
Physician Discharge Summary  Christine Zavala XFG:[id number] DOB: 1934-01-28 DOA: 08/15/2019  PCP: Alycia Rossetti, MD  Admit date: 08/15/2019 Discharge date: 08/22/2019  Admitted From Home  Disposition:  Home with hospice  Recommendations for Outpatient Follow-up:  Comfort care.    Discharge Condition: Stable.  CODE STATUS: DNR Diet recommendation: Heart Healthy   Brief/Interim Summary: 86 year old with past medical history significant for dementia, endometrial cancer, colon cancer status post left colectomy, diabetes, DVT, hypertension, hyperlipidemia, CVA presents to the ED for evaluation of altered mental status, generalized weakness and abnormal labs.  Patient was sent to the ED by her PCP for evaluation of leukocytosis and abnormal liver enzymes.  Patient was noted to be confused and she did not know why she is in the hospital. Evaluation in the ED patient was afebrile, white count 19, glucose 305, creatinine 1.3, AST mildly elevated at 51, alkaline phosphatase 263, bilirubin normal.  Ammonia level normal.  Chest x-ray showed bilateral pulmonary nodules, largest of the right lung base.  CT chest showed numerous bilateral solid pulmonary nodules consistent with pulmonary metastatic disease. CT abdomen and pelvis showing numerous heterogeneous hepatic metastasis.  Dilated fluid-filled endometrial canal with a cystic mass within the uterine fundus endometrial canal concerning for recurrence of endometrial or uterine primary neoplasm.   1-Concern for recurrence of endometrial cancer with distant metastasis versus liver metastasis related to colon cancer Discussed with Dr. Harrington Challenger, plan is to proceed with IR evaluation for liver biopsy. Discussed with Daughter family has decide to move forward with hospice care. No further chemo or treatment or Biopsy. I think patient qualify for hospice care due to metastatic cancer, uncurable.  Palliative care consulted for evaluation for hospice facility  evaluation.  Plan to discharge home with hospice today.  Comfort medications provided/   2-SIRS/possible sepsis: Patient presented with leukocytosis and tachycardia. CT chest abdomen negative for source of infection. UA: Negative, urine culture no growth. Blood cultures: No growth.  White blood cell trending down If MRSA negative,  discontinue vancomycin Treated  with cefepime. Completed 5 days.  Unclear source.   Acute metabolic encephalopathy, underlying dementia and: Generalized weakness: Suspect related to malignancy PT OT eval CT head not acute intracranial abnormality Remain confuse.   Hyperglycemia: Non-insulin-dependent diabetes: Continue with sliding scale insulin. Start low-dose Lantus while inpatient  CKD stage III B; monitor renal function. History of CVA: resume Plavix.  Prolonged QT: Normal magnesium. Replete k.   Discharge Diagnoses:  Principal Problem:   Metastasis ([place]) Active Problems:   Type 2 diabetes mellitus with diabetic chronic kidney disease (HCC)   Generalized weakness   Leukocytosis   Acute metabolic encephalopathy    Discharge Instructions  Discharge Instructions    Diet - low sodium heart healthy   Complete by: As directed    Diet - low sodium heart healthy   Complete by: As directed    Increase activity slowly   Complete by: As directed    Increase activity slowly   Complete by: As directed      Allergies as of 08/22/2019      Reactions   Aspirin Nausea And Vomiting   Calcium-containing Compounds    Legs burn   Codeine Sulfate Nausea And Vomiting   Lipitor [atorvastatin]    myalgias   Zocor [simvastatin]    pruritis   Zyrtec [cetirizine]       Medication List    STOP taking these medications   furosemide 40 MG tablet Commonly known as: LASIX   glimepiride  1 MG tablet Commonly known as: AMARYL   Lancets Misc   loratadine 10 MG tablet Commonly known as: CLARITIN   pravastatin 80 MG tablet Commonly known  as: PRAVACHOL     TAKE these medications   acetaminophen 500 MG tablet Commonly known as: TYLENOL Take 1,000 mg by mouth every 8 (eight) hours as needed for mild pain.   atropine 1 % ophthalmic solution Place 3 drops under the tongue every 4 (four) hours as needed (excess secretions).   Blood Glucose System Pak Kit Please dispense based on patient and insurance preference. Use as directed to monitor FSBS 2x daily. Dx: E11.9.   clopidogrel 75 MG tablet Commonly known as: PLAVIX Take 1 tablet (75 mg total) by mouth daily.   haloperidol 2 MG/ML solution Commonly known as: HALDOL Take 0.3 mLs (0.6 mg total) by mouth every 4 (four) hours as needed for agitation (or nausea).   HYDROcodone-acetaminophen 5-325 MG tablet Commonly known as: NORCO/VICODIN Take 1 tablet by mouth every 6 (six) hours as needed for severe pain.   Januvia 25 MG tablet Generic drug: sitaGLIPtin TAKE 1 TABLET BY MOUTH  DAILY What changed: how much to take   levonorgestrel 20 MCG/24HR IUD Commonly known as: MIRENA 1 Intra Uterine Device (1 each total) by Intrauterine route once for 1 dose. To be inserted in the OR   memantine 14 MG Cp24 24 hr capsule Commonly known as: NAMENDA XR TAKE 1 CAPSULE BY MOUTH EVERYDAY AT BEDTIME What changed:   how much to take  how to take this  when to take this  additional instructions   mirabegron ER 50 MG Tb24 tablet Commonly known as: Myrbetriq Take 1 tablet (50 mg total) by mouth daily.   ondansetron 4 MG disintegrating tablet Commonly known as: Zofran ODT Take 1 tablet (4 mg total) by mouth every 8 (eight) hours as needed for nausea or vomiting.   OneTouch Ultra test strip Generic drug: glucose blood TEST AS DIRECTED 2 X DAILY   senna-docusate 8.6-50 MG tablet Commonly known as: Senokot-S Take 1 tablet by mouth 2 (two) times daily.   trimethoprim 100 MG tablet Commonly known as: TRIMPEX Take 1 tablet (100 mg total) by mouth daily. Prophylaxis        Allergies  Allergen Reactions  . Aspirin Nausea And Vomiting  . Calcium-Containing Compounds     Legs burn  . Codeine Sulfate Nausea And Vomiting  . Lipitor [Atorvastatin]     myalgias  . Zocor [Simvastatin]     pruritis  . Zyrtec [Cetirizine]     Consultations:  Dr Denman George, GYN oncologist   Palliative    Procedures/Studies: DG Chest 2 View  Result Date: 08/15/2019 CLINICAL DATA:  Generalized weakness and nausea, increasing confusion EXAM: CHEST - 2 VIEW COMPARISON:  06/11/2010 FINDINGS: Frontal and lateral views of the chest demonstrate an unremarkable cardiac silhouette. Numerous nodules are seen within the bilateral lungs, largest measuring 1.6 cm at the right lung base. Chest CT recommended for further evaluation. No effusion or pneumothorax. No acute fractures. IMPRESSION: 1. Bilateral pulmonary nodules, largest at the right lung base. CT chest with IV contrast recommended for further evaluation. Electronically Signed   By: Randa Ngo M.D.   On: 08/15/2019 23:25   CT HEAD WO CONTRAST  Result Date: 08/16/2019 CLINICAL DATA:  Encephalopathy EXAM: CT HEAD WITHOUT CONTRAST TECHNIQUE: Contiguous axial images were obtained from the base of the skull through the vertex without intravenous contrast. COMPARISON:  MRI May 16, 2016 FINDINGS:  Brain: No evidence of acute territorial infarction, hemorrhage, hydrocephalus,extra-axial collection or mass lesion/mass effect. There is dilatation the ventricles and sulci consistent with age-related atrophy. Low-attenuation changes in the deep white matter consistent with small vessel ischemia. Prior lacunar infarct seen involving the right internal capsule. Vascular: No hyperdense vessel or unexpected calcification. Skull: The skull is intact. No fracture or focal lesion identified. Sinuses/Orbits: The visualized paranasal sinuses and mastoid air cells are clear. The orbits and globes intact. Other: None IMPRESSION: No acute intracranial  abnormality. Findings consistent with age related atrophy and chronic small vessel ischemia Electronically Signed   By: Prudencio Pair M.D.   On: 08/16/2019 05:22   CT Chest W Contrast  Result Date: 08/16/2019 CLINICAL DATA:  Lower abdominal pain, abnormal chest x-ray, history of colon and endometrial cancer EXAM: CT ABDOMEN AND PELVIS WITH CONTRAST TECHNIQUE: Multidetector CT imaging of the abdomen and pelvis was performed using the standard protocol following bolus administration of intravenous contrast. CONTRAST:  87m OMNIPAQUE IOHEXOL 300 MG/ML  SOLN COMPARISON:  Oct 22, 2017 FINDINGS: Cardiovascular: Coronary artery aortic valve and mitral valve calcifications are seen. There is scattered dense aortic atherosclerosis. There is atherosclerotic calcification at the origin of the great vessels without significant stenosis. The heart size is normal. There is no pericardial thickening or effusion. Mediastinum/Nodes: There are no enlarged mediastinal, hilar or axillary lymph nodes. The thyroid gland, trachea and esophagus demonstrate no significant findings. Lungs/Pleura: Numerous bilateral solid nodules are seen throughout both lungs the largest for example in the posterior right lung base measuring 1.9 cm. Streaky atelectasis or scarring is seen within the anterior left upper lobe. No pleural effusion or pneumothorax. For Musculoskeletal/Chest Yousuf: There is no chest Okazaki mass or suspicious osseous finding. No acute osseous abnormality Abdomen/pelvis: For Hepatobiliary: Heterogeneously hypodense liver masses are seen throughout. The largest in the anterior upper right liver lobe measuring 6.9 x 5.7 cm, best seen on series 2, image 34. The main portal vein is patent. The patient is status post cholecystectomy. No biliary ductal dilation. Pancreas: Unremarkable. No pancreatic ductal dilatation or surrounding inflammatory changes. Spleen: Normal in size without focal abnormality. Adrenals/Urinary Tract: Both  adrenal glands appear normal. Mild bilateral renal atrophy is noted. There is a low-density lesion again noted in lower pole of the left kidney measuring 3.9 cm. Bladder is unremarkable. Stomach/Bowel: The stomach, small bowel, and colon are normal in appearance. Scattered colonic diverticula are noted without diverticulitis. The patient has had a prior partial colonic resection with anastomosis. No inflammatory changes, Stacy thickening, or obstructive findings. Vascular/Lymphatic: No mesenteric or retroperitoneal adenopathy seen. There is a small left pelvic sidewall lymph node measuring 9.8 mm, series 2, image 99. Scattered aortic atherosclerotic calcifications are seen without aneurysmal dilatation. Reproductive: Within the endometrial canal there is a IUD which is positioned slightly laterally and leftward. There is markedly dilated endometrial canal with fluid. Heterogeneously hypodense mass appears to be extending into the posterior uterine fundus. No definite adnexal abnormality is noted. Other: No evidence of abdominal Cespedes mass or hernia. Musculoskeletal: No acute or significant osseous findings. IMPRESSION: Numerous bilateral solid pulmonary nodules, consistent with pulmonary metastatic disease. Numerous heterogeneous hepatic metastases. Dilated fluid-filled endometrial canal with a cystic mass within the uterine fundus/endometrial canal, concerning for recurrence of endometrial /uterine primary neoplasm. 9 mm left pelvic sidewall lymph node, could represent metastatic disease, would recommend attention on follow-up exam. Electronically Signed   By: BPrudencio PairM.D.   On: 08/16/2019 00:32   CT ABDOMEN PELVIS W  CONTRAST  Result Date: 08/16/2019 CLINICAL DATA:  Lower abdominal pain, abnormal chest x-ray, history of colon and endometrial cancer EXAM: CT ABDOMEN AND PELVIS WITH CONTRAST TECHNIQUE: Multidetector CT imaging of the abdomen and pelvis was performed using the standard protocol following bolus  administration of intravenous contrast. CONTRAST:  76m OMNIPAQUE IOHEXOL 300 MG/ML  SOLN COMPARISON:  Oct 22, 2017 FINDINGS: Cardiovascular: Coronary artery aortic valve and mitral valve calcifications are seen. There is scattered dense aortic atherosclerosis. There is atherosclerotic calcification at the origin of the great vessels without significant stenosis. The heart size is normal. There is no pericardial thickening or effusion. Mediastinum/Nodes: There are no enlarged mediastinal, hilar or axillary lymph nodes. The thyroid gland, trachea and esophagus demonstrate no significant findings. Lungs/Pleura: Numerous bilateral solid nodules are seen throughout both lungs the largest for example in the posterior right lung base measuring 1.9 cm. Streaky atelectasis or scarring is seen within the anterior left upper lobe. No pleural effusion or pneumothorax. For Musculoskeletal/Chest Pesta: There is no chest Lipari mass or suspicious osseous finding. No acute osseous abnormality Abdomen/pelvis: For Hepatobiliary: Heterogeneously hypodense liver masses are seen throughout. The largest in the anterior upper right liver lobe measuring 6.9 x 5.7 cm, best seen on series 2, image 34. The main portal vein is patent. The patient is status post cholecystectomy. No biliary ductal dilation. Pancreas: Unremarkable. No pancreatic ductal dilatation or surrounding inflammatory changes. Spleen: Normal in size without focal abnormality. Adrenals/Urinary Tract: Both adrenal glands appear normal. Mild bilateral renal atrophy is noted. There is a low-density lesion again noted in lower pole of the left kidney measuring 3.9 cm. Bladder is unremarkable. Stomach/Bowel: The stomach, small bowel, and colon are normal in appearance. Scattered colonic diverticula are noted without diverticulitis. The patient has had a prior partial colonic resection with anastomosis. No inflammatory changes, Candy thickening, or obstructive findings.  Vascular/Lymphatic: No mesenteric or retroperitoneal adenopathy seen. There is a small left pelvic sidewall lymph node measuring 9.8 mm, series 2, image 99. Scattered aortic atherosclerotic calcifications are seen without aneurysmal dilatation. Reproductive: Within the endometrial canal there is a IUD which is positioned slightly laterally and leftward. There is markedly dilated endometrial canal with fluid. Heterogeneously hypodense mass appears to be extending into the posterior uterine fundus. No definite adnexal abnormality is noted. Other: No evidence of abdominal Mcbrearty mass or hernia. Musculoskeletal: No acute or significant osseous findings. IMPRESSION: Numerous bilateral solid pulmonary nodules, consistent with pulmonary metastatic disease. Numerous heterogeneous hepatic metastases. Dilated fluid-filled endometrial canal with a cystic mass within the uterine fundus/endometrial canal, concerning for recurrence of endometrial /uterine primary neoplasm. 9 mm left pelvic sidewall lymph node, could represent metastatic disease, would recommend attention on follow-up exam. Electronically Signed   By: BPrudencio PairM.D.   On: 08/16/2019 00:32     Subjective: Alert, non conversant.   Discharge Exam: Vitals:   08/21/19 2103 08/22/19 0457  BP: (!) 146/71 (!) 155/74  Pulse: 80 75  Resp: 18 18  Temp: 99.2 F (37.3 C) 98.6 F (37 C)  SpO2: 98% 98%     General: Pt is alert, awake, not in acute distress Cardiovascular: RRR, S1/S2 +, no rubs, no gallops Respiratory: CTA bilaterally, no wheezing, no rhonchi Abdominal: Soft, NT, ND, bowel sounds + Extremities: no edema, no cyanosis    The results of significant diagnostics from this hospitalization (including imaging, microbiology, ancillary and laboratory) are listed below for reference.     Microbiology: Recent Results (from the past 240 hour(s))  SARS-COV-2  RNA,(COVID-19) QUAL NAAT     Status: None   Collection Time: 08/15/19 12:52 PM    Specimen: Respiratory  Result Value Ref Range Status   SARS CoV2 RNA NOT DETECTED NOT DETECT Final    Comment: . A Not Detected (negative) test result for this test means that SARS- CoV-2 RNA was not present in the specimen above the limit of detection. A negative result does not rule out the possibility of COVID-19 and should not be used as the sole basis for treatment or patient management  decisions.  If COVID-19 is still suspected, based on  exposure history together with other clinical findings, re-testing should be considered in consultation with public health authorities. Laboratory test results should always be considered in the context of clinical  observations and epidemiological data in making a final diagnosis and patient management decisions. . Please review the "Fact Sheets" and FDA authorized labeling available for health care providers and patients using the following websites: https://[email or web address]/fact-[email or web address]  [email or web address]://[email or web address]/home/[email or web address]/[email or web address]/ fact-sheet2 . This test has been authorized by the F DA under an  Emergency Use Authorization (EUA) for use by authorized laboratories. . Due to the current public health emergency, [facility] is receiving a high volume of samples from a wide variety of swabs and media for COVID-19 testing. In order to serve patients during this public health crisis, samples from appropriate clinical sources are  being tested. Negative test results derived from specimens received in non-commercially manufactured viral collection and transport media, or in media and sample collection kits not yet authorized by FDA for COVID-19 testing should be cautiously evaluated and the patient potentially subjected to extra precautions such as additional clinical monitoring, including collection of an additional specimen. . Methodology:  Nucleic Acid Amplification Test  (NAAT) includes RT-PCR or TMA . Additional information about COVID-19 can be found at the [employer] website: www.[email or web address].   Urine Culture     Status: Abnormal   Collection Time: 08/15/19  1:11 PM   Specimen: Urine  Result Value Ref Range Status   MICRO NUMBER: [id number]  Final   SPECIMEN QUALITY: Adequate  Final   Sample Source URINE  Final   STATUS: FINAL  Final   ISOLATE 1: Coagulase negative staphylococcus, not S. (A)  Final    Comment: 50,000-100,000 CFU/mL of Coagulase negative staphylococcus, not S. saprophyticus May represent colonizers from external and internal genitalia. No further testing (including susceptibility) will be performed.  Blood culture (routine x 2)     Status: None   Collection Time: 08/15/19 11:00 PM   Specimen: BLOOD RIGHT ARM  Result Value Ref Range Status   Specimen Description BLOOD RIGHT ARM  Final   Special Requests   Final    BOTTLES DRAWN AEROBIC AND ANAEROBIC Blood Culture results may not be optimal due to an inadequate volume of blood received in culture bottles Performed at [facility], [place] [address] [place], [place] [address]    Culture   Final    NO GROWTH 5 DAYS Performed at [facility], [place] [address], [place], [place] [address]    Report Status 08/21/2019 FINAL  Final  SARS CORONAVIRUS 2 (TAT 6-24 HRS) Nasopharyngeal Nasopharyngeal Swab     Status: None   Collection Time: 08/16/19  2:30 AM   Specimen: Nasopharyngeal Swab  Result Value Ref Range Status   SARS Coronavirus 2 NEGATIVE NEGATIVE Final    Comment: (NOTE) SARS-CoV-2 target nucleic acids are NOT DETECTED. The SARS-CoV-2 RNA is generally detectable in  upper and lower respiratory specimens during the acute phase of infection. Negative results do not preclude SARS-CoV-2 infection, do not rule out co-infections with other pathogens, and should not be used as the sole basis for treatment or other patient management  decisions. Negative results must be combined with clinical observations, patient history, and epidemiological information. The expected result is Negative. Fact Sheet for Patients: SugarRoll.be Fact Sheet for Healthcare Providers: https://www.woods-mathews.com/ This test is not yet approved or cleared by the [place] FDA and  has been authorized for detection and/or diagnosis of SARS-CoV-2 by FDA under an Emergency Use Authorization (EUA). This EUA will remain  in effect (meaning this test can be used) for the duration of the COVID-19 declaration under Section 56 4(b)(1) of the Act, 21 U.S.C. section 360bbb-3(b)(1), unless the authorization is terminated or revoked sooner. Performed at [facility], [place] [address] [place], [place], [place] [address]   Blood culture (routine x 2)     Status: None   Collection Time: 08/16/19  7:08 AM   Specimen: BLOOD  Result Value Ref Range Status   Specimen Description   Final    BLOOD LEFT ANTECUBITAL Performed at [place] [address] [place], [place] [address]    Special Requests   Final    BOTTLES DRAWN AEROBIC AND ANAEROBIC Blood Culture adequate volume Performed at [place] [address] [place], [place] [address]    Culture   Final    NO GROWTH 5 DAYS Performed at [facility], [place] [address] [place], [place] [address]    Report Status 08/21/2019 FINAL  Final  Urine culture     Status: None   Collection Time: 08/16/19 11:54 AM   Specimen: Urine, Catheterized  Result Value Ref Range Status   Specimen Description   Final    URINE, CATHETERIZED Performed at [facility], [place] [address], [place], [place] [address]    Special Requests   Final    NONE Performed at [facility], [place] [address] [place], [place] [address]    Culture   Final    NO GROWTH Performed at [facility], [place] [address] [place], [place] [address]    Report Status 08/17/2019 FINAL  Final  MRSA PCR Screening     Status: None   Collection Time: 08/17/19  4:40 PM   Specimen: Nasopharyngeal  Result Value Ref Range Status   MRSA by PCR NEGATIVE NEGATIVE Final    Comment:        The GeneXpert MRSA Assay (FDA approved for NASAL specimens only), is one component of a comprehensive MRSA colonization surveillance program. It is not intended to diagnose MRSA infection nor to guide or monitor treatment for MRSA infections. Performed at [facility], [place] [address] [place], [place] [address]      Labs: BNP (last 3 results) No results for input(s): BNP in the last 8760 hours. Basic Metabolic Panel: Recent Labs  Lab 08/15/19 1256 08/15/19 1853 08/16/19 0708 08/17/19 1126 08/18/19 0725 08/21/19 0811  NA 134* 135  --  136 138 139  K 3.8 3.8  --  3.8 4.2 3.9  CL 97* 100  --  107 110 113*  CO2 22 22  --  22 20* 19*  GLUCOSE 283* 305*  --  244* 216* 203*  BUN 23 28*  --  27* 31* 38*  CREATININE 1.22* 1.33*  --  1.41* 1.13* 1.03*  CALCIUM 8.6 8.6*  --  8.1* 8.3* 8.7*  MG  --   --  2.2  --   --   --    Liver Function Tests: Recent Labs  Lab 08/15/19 1256 08/15/19 1853  AST 52* 51*  ALT 26 31  ALKPHOS  --  263*  BILITOT 0.9 0.8  PROT 6.0* 6.7  ALBUMIN  --  2.8*   Recent Labs  Lab 08/15/19 1256  LIPASE 11   Recent Labs  Lab 08/15/19 2223  AMMONIA 13   CBC: Recent Labs  Lab 08/15/19 1256 08/15/19 1853 08/16/19 0708 08/17/19 1126 08/18/19 0725  WBC 16.8* 19.0* 16.4* 13.1* 13.2*  NEUTROABS 14,851*  --  13.8*  --   --   HGB 12.6 12.4 11.9* 11.2* 11.3*  HCT 38.5 40.0 39.4 36.8 37.5  MCV 90.2 93.5 94.9 97.6 97.2  PLT 239 258 213 192 216   Cardiac Enzymes: No results for input(s): CKTOTAL, CKMB, CKMBINDEX, TROPONINI in the last 168 hours. BNP: Invalid input(s): POCBNP CBG: Recent Labs  Lab 08/21/19 0730 08/21/19 1139 08/21/19 1708 08/21/19 2108  08/22/19 0746  GLUCAP 181* 224* 139* 116* 117*   D-Dimer No results for input(s): DDIMER in the last 72 hours. Hgb A1c No results for input(s): HGBA1C in the last 72 hours. Lipid Profile No results for input(s): CHOL, HDL, LDLCALC, TRIG, CHOLHDL, LDLDIRECT in the last 72 hours. Thyroid function studies No results for input(s): TSH, T4TOTAL, T3FREE, THYROIDAB in the last 72 hours.  Invalid input(s): FREET3 Anemia work up No results for input(s): VITAMINB12, FOLATE, FERRITIN, TIBC, IRON, RETICCTPCT in the last 72 hours. Urinalysis    Component Value Date/Time   COLORURINE YELLOW 08/16/2019 1154   APPEARANCEUR CLEAR 08/16/2019 1154   LABSPEC 1.038 (H) 08/16/2019 1154   PHURINE 5.0 08/16/2019 1154   GLUCOSEU NEGATIVE 08/16/2019 1154   HGBUR NEGATIVE 08/16/2019 1154   BILIRUBINUR NEGATIVE 08/16/2019 1154   KETONESUR NEGATIVE 08/16/2019 1154   PROTEINUR NEGATIVE 08/16/2019 1154   UROBILINOGEN 0.2 03/06/2014 0936   NITRITE NEGATIVE 08/16/2019 1154   LEUKOCYTESUR NEGATIVE 08/16/2019 1154   [facility] Invalid input(s): PROCALCITONIN,  WBC,  LACTICIDVEN Microbiology Recent Results (from the past 240 hour(s))  SARS-COV-2 RNA,(COVID-19) QUAL NAAT     Status: None   Collection Time: 08/15/19 12:52 PM   Specimen: Respiratory  Result Value Ref Range Status   SARS CoV2 RNA NOT DETECTED NOT DETECT Final    Comment: . A Not Detected (negative) test result for this test means that SARS- CoV-2 RNA was not present in the specimen above the limit of detection. A negative result does not rule out the possibility of COVID-19 and should not be used as the sole basis for treatment or patient management  decisions.  If COVID-19 is still suspected, based on  exposure history together with other clinical findings, re-testing should be considered in consultation with public health authorities. Laboratory test results should always be considered in the context of clinical  observations and  epidemiological data in making a final diagnosis and patient management decisions. . Please review the "Fact Sheets" and FDA authorized labeling available for health care providers and patients using the following websites: https://[email or web address]/fact-sheet2  [email or web address]://[email or web address]/ fact-sheet2 . This test has been authorized by the F DA under an  Emergency Use Authorization (EUA) for use by authorized laboratories. . Due to the current public health emergency, [facility] is receiving a high volume of samples from a wide variety of swabs and media for COVID-19  testing. In order to serve patients during this public health crisis, samples from appropriate clinical sources are  being tested. Negative test results derived from specimens received in non-commercially manufactured viral collection and transport media, or in media and sample collection kits not yet authorized by FDA for COVID-19 testing should be cautiously evaluated and the patient potentially subjected to extra precautions such as additional clinical monitoring, including collection of an additional specimen. . Methodology:  Nucleic Acid Amplification Test (NAAT) includes RT-PCR or TMA . Additional information about COVID-19 can be found at the [employer] website: [email or web address].   Urine Culture     Status: Abnormal   Collection Time: 08/15/19  1:11 PM   Specimen: Urine  Result Value Ref Range Status   MICRO NUMBER: [id number]  Final   SPECIMEN QUALITY: Adequate  Final   Sample Source URINE  Final   STATUS: FINAL  Final   ISOLATE 1: Coagulase negative staphylococcus, not S. (A)  Final    Comment: 50,000-100,000 CFU/mL of Coagulase negative staphylococcus, not S. saprophyticus May represent colonizers from external and internal genitalia. No further testing (including susceptibility) will be performed.  Blood  culture (routine x 2)     Status: None   Collection Time: 08/15/19 11:00 PM   Specimen: BLOOD RIGHT ARM  Result Value Ref Range Status   Specimen Description BLOOD RIGHT ARM  Final   Special Requests   Final    BOTTLES DRAWN AEROBIC AND ANAEROBIC Blood Culture results may not be optimal due to an inadequate volume of blood received in culture bottles Performed at [facility], [place] [address] [place], [place] [address]    Culture   Final    NO GROWTH 5 DAYS Performed at [facility], [place] [address] [place], [place] [address]    Report Status 08/21/2019 FINAL  Final  SARS CORONAVIRUS 2 (TAT 6-24 HRS) Nasopharyngeal Nasopharyngeal Swab     Status: None   Collection Time: 08/16/19  2:30 AM   Specimen: Nasopharyngeal Swab  Result Value Ref Range Status   SARS Coronavirus 2 NEGATIVE NEGATIVE Final    Comment: (NOTE) SARS-CoV-2 target nucleic acids are NOT DETECTED. The SARS-CoV-2 RNA is generally detectable in upper and lower respiratory specimens during the acute phase of infection. Negative results do not preclude SARS-CoV-2 infection, do not rule out co-infections with other pathogens, and should not be used as the sole basis for treatment or other patient management decisions. Negative results must be combined with clinical observations, patient history, and epidemiological information. The expected result is Negative. Fact Sheet for Patients: SugarRoll[employer]be Fact Sheet for Healthcare Providers: https://www.woods-mathews.com/ This test is not yet approved or cleared by the [place] FDA and  has been authorized for detection and/or diagnosis of SARS-CoV-2 by FDA under an Emergency Use Authorization (EUA). This EUA will remain  in effect (meaning this test can be used) for the duration of the COVID-19 declaration under Section 56 4(b)(1) of the Act, 21 U.S.C. section 360bbb-3(b)(1), unless the authorization is  terminated or revoked sooner. Performed at [facility], [place] [address] [place], [place] [address]   Blood culture (routine x 2)     Status: None   Collection Time: 08/16/19  7:08 AM   Specimen: BLOOD  Result Value Ref Range Status   Specimen Description   Final    BLOOD LEFT ANTECUBITAL Performed at [place] [address] [place],  [address]    Special Requests  Final    BOTTLES DRAWN AEROBIC AND ANAEROBIC Blood Culture adequate volume Performed at [place] [address] [place], [place] [address]    Culture   Final    NO GROWTH 5 DAYS Performed at [facility], [place] [address], [place], [place] [address]    Report Status 08/21/2019 FINAL  Final  Urine culture     Status: None   Collection Time: 08/16/19 11:54 AM   Specimen: Urine, Catheterized  Result Value Ref Range Status   Specimen Description   Final    URINE, CATHETERIZED Performed at [facility], [place] [address] [place], [place] [address]    Special Requests   Final    NONE Performed at [facility], [place] [address], [place], [place] [address]    Culture   Final    NO GROWTH Performed at [facility], [place] [address], [place], [place] [address]    Report Status 08/17/2019 FINAL  Final  MRSA PCR Screening     Status: None   Collection Time: 08/17/19  4:40 PM   Specimen: Nasopharyngeal  Result Value Ref Range Status   MRSA by PCR NEGATIVE NEGATIVE Final    Comment:        The GeneXpert MRSA Assay (FDA approved for NASAL specimens only), is one component of a comprehensive MRSA colonization surveillance program. It is not intended to diagnose MRSA infection nor to guide or monitor treatment for MRSA infections. Performed at [facility], [place] [address], [place], [place] [address]      Time coordinating discharge: 40 minutes  SIGNED:   Elmarie Shiley, MD  [facility]

## 2019-09-24 DEATH — deceased

## 2019-12-05 ENCOUNTER — Ambulatory Visit: Payer: Medicare HMO | Admitting: Family Medicine

## 2020-11-23 IMAGING — CT CT ABD-PELV W/ CM
2 of 5 series · 13 of 36 positions shown, 16 images · IV contrast (OMNIPAQUE)
Comparison: October 22, 2017

CLINICAL DATA: Lower abdominal pain, abnormal chest x-ray, history
of colon and endometrial cancer

EXAM:
CT ABDOMEN AND PELVIS WITH CONTRAST
TECHNIQUE: Multidetector CT imaging of the abdomen and pelvis was performed
using the standard protocol following bolus administration of
intravenous contrast.
CONTRAST:  75mL OMNIPAQUE IOHEXOL 300 MG/ML  SOLN

[Series 2: cap with · axial · 0.98mm/px · z∈[-598,-98]mm · 10 of 120 slices shown, 13 images]
[im 10/120  mediastinal]
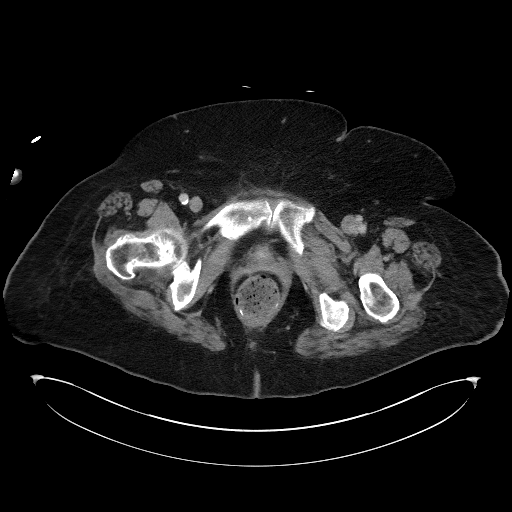
[im 10/120  lung]
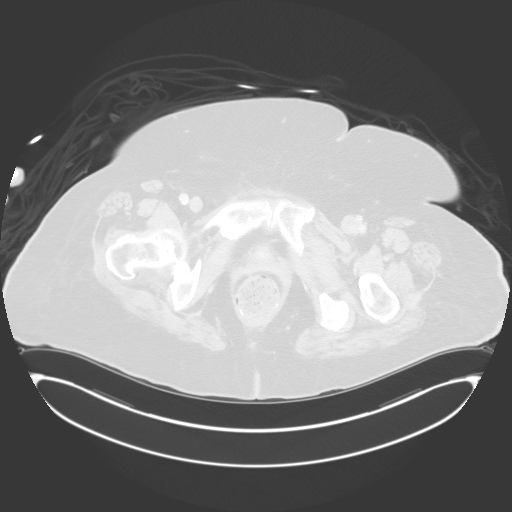
[im 20/120  lung]
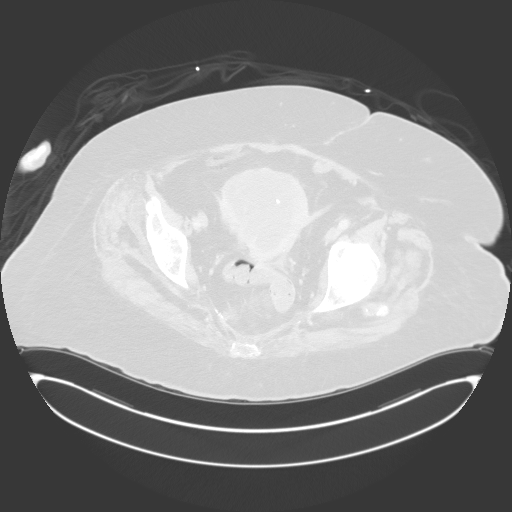
[im 30/120  lung]
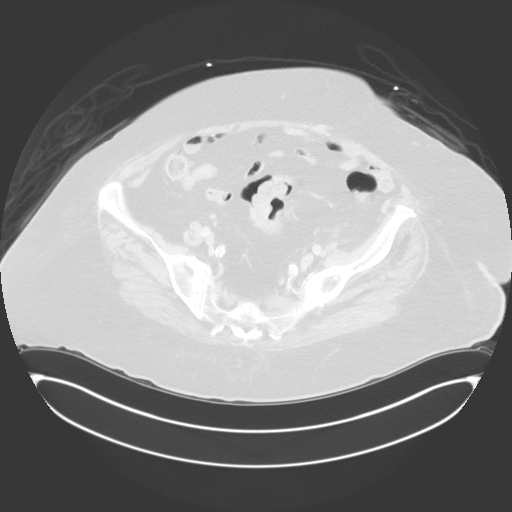
[im 40/120  lung]
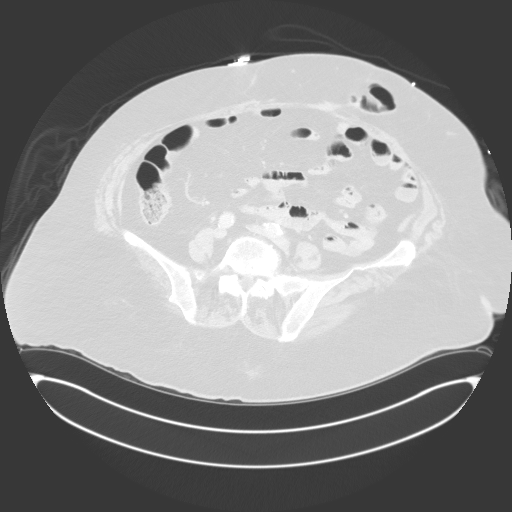
[im 50/120  mediastinal]
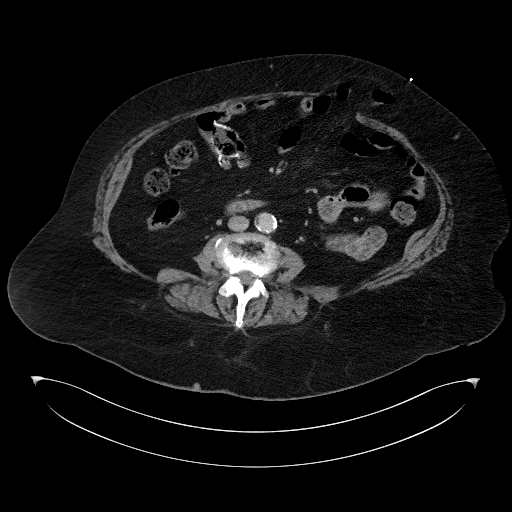
[im 50/120  lung]
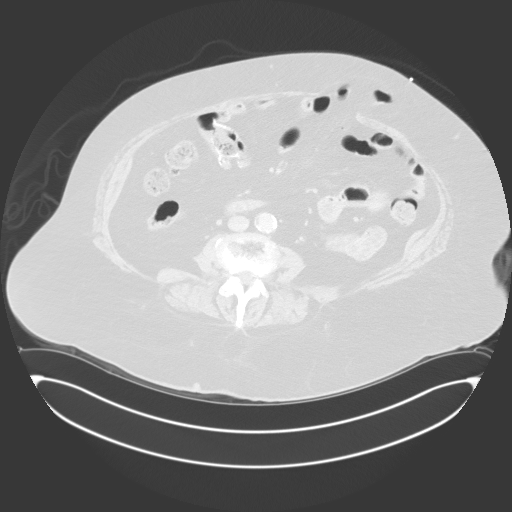
[im 70/120  lung]
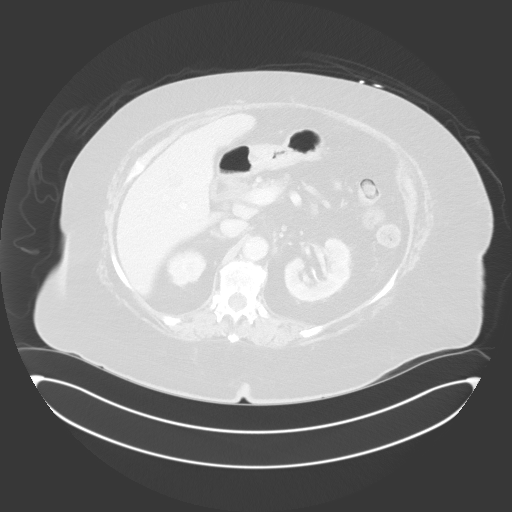
[im 80/120  lung]
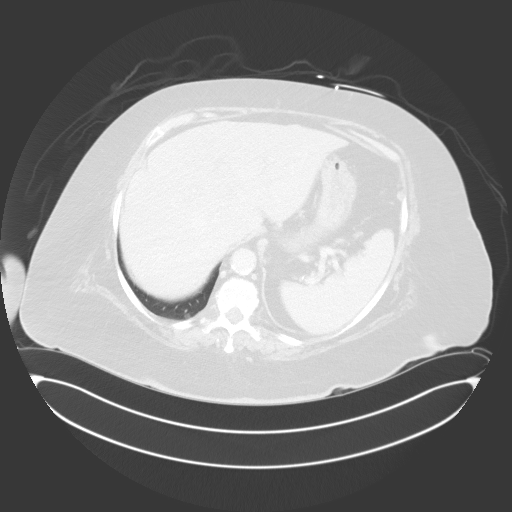
[im 90/120  lung]
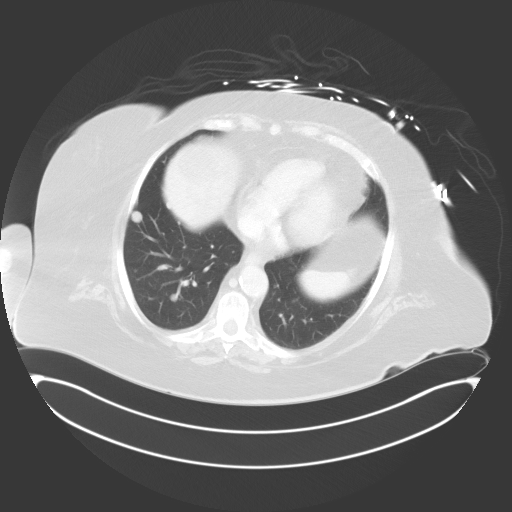
[im 100/120  mediastinal]
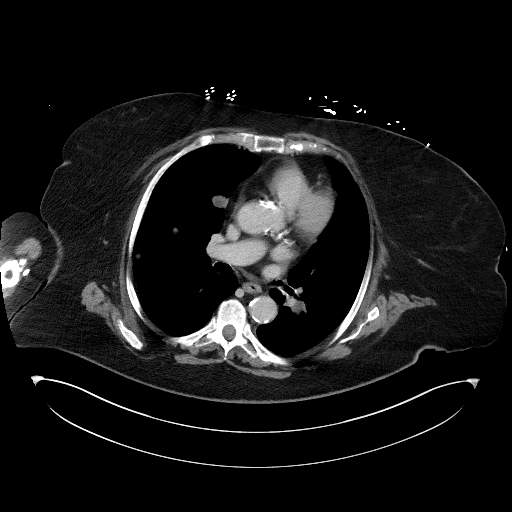
[im 100/120  lung]
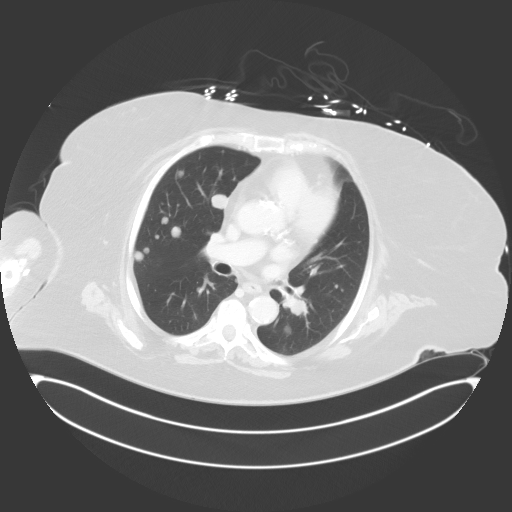
[im 110/120  lung]
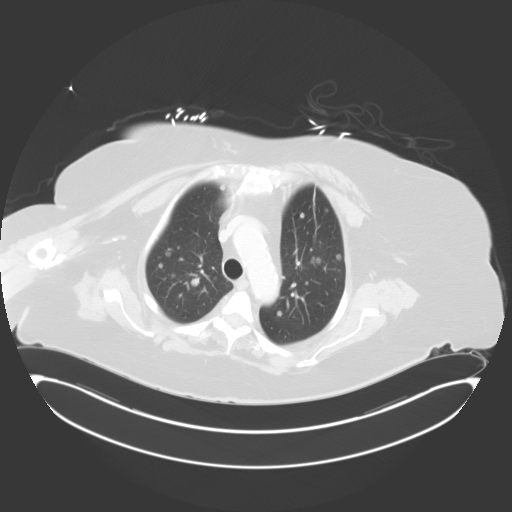

[Series 5: coronals · coronal · 0.82mm/px · 3 of 164 slices shown]
[im 33/164  lung]
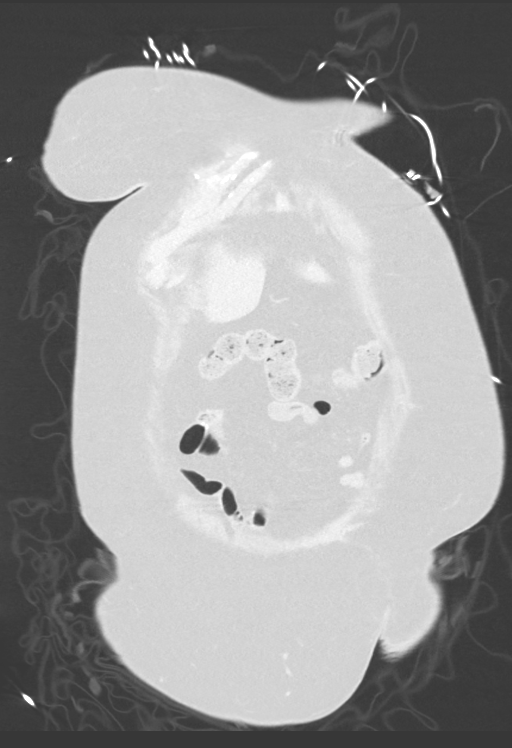
[im 66/164  lung]
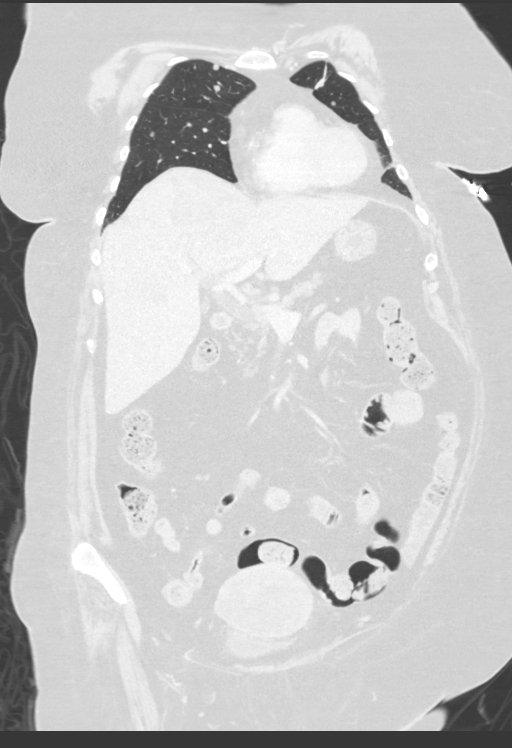
[im 98/164  lung]
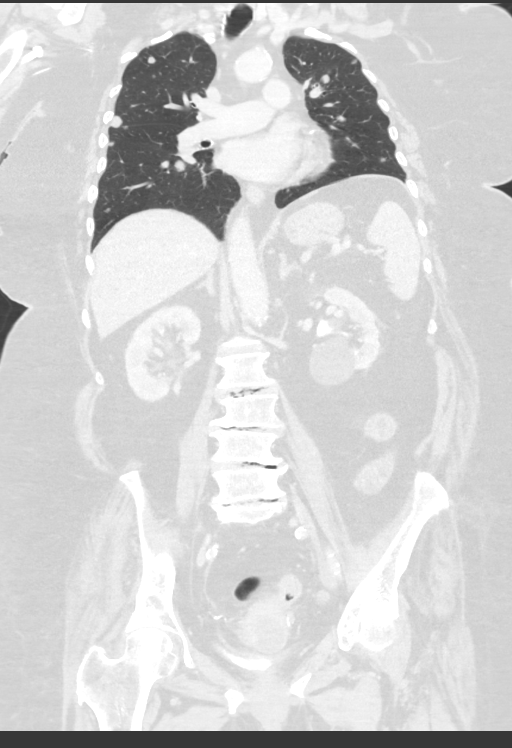

[13 of 36 positions shown; findings below may reference images not displayed]

FINDINGS: Cardiovascular: Coronary artery aortic valve and mitral valve
calcifications are seen. There is scattered dense aortic
atherosclerosis. There is atherosclerotic calcification at the
origin of the great vessels without significant stenosis. The heart
size is normal. There is no pericardial thickening or effusion.

Mediastinum/Nodes: There are no enlarged mediastinal, hilar or
axillary lymph nodes. The thyroid gland, trachea and esophagus
demonstrate no significant findings.

Lungs/Pleura: Numerous bilateral solid nodules are seen throughout
both lungs the largest for example in the posterior right lung base
measuring 1.9 cm. Streaky atelectasis or scarring is seen within the
anterior left upper lobe. No pleural effusion or pneumothorax.

For

Musculoskeletal/Chest wall: There is no chest wall mass or
suspicious osseous finding. No acute osseous abnormality

Abdomen/pelvis:

For

Hepatobiliary: Heterogeneously hypodense liver masses are seen
throughout. The largest in the anterior upper right liver lobe
measuring 6.9 x 5.7 cm, best seen on series 2, image 34. The main
portal vein is patent. The patient is status post cholecystectomy.
No biliary ductal dilation.

Pancreas: Unremarkable. No pancreatic ductal dilatation or
surrounding inflammatory changes.

Spleen: Normal in size without focal abnormality.

Adrenals/Urinary Tract: Both adrenal glands appear normal. Mild
bilateral renal atrophy is noted. There is a low-density lesion
again noted in lower pole of the left kidney measuring 3.9 cm.
Bladder is unremarkable.

Stomach/Bowel: The stomach, small bowel, and colon are normal in
appearance. Scattered colonic diverticula are noted without
diverticulitis. The patient has had a prior partial colonic
resection with anastomosis. No inflammatory changes, wall
thickening, or obstructive findings.

Vascular/Lymphatic: No mesenteric or retroperitoneal adenopathy
seen. There is a small left pelvic sidewall lymph node measuring
mm, series 2, image 99. Scattered aortic atherosclerotic
calcifications are seen without aneurysmal dilatation.

Reproductive: Within the endometrial canal there is a IUD which is
positioned slightly laterally and leftward. There is markedly
dilated endometrial canal with fluid. Heterogeneously hypodense mass
appears to be extending into the posterior uterine fundus. No
definite adnexal abnormality is noted.

Other: No evidence of abdominal wall mass or hernia.

Musculoskeletal: No acute or significant osseous findings.
IMPRESSION: Numerous bilateral solid pulmonary nodules, consistent with
pulmonary metastatic disease.

Numerous heterogeneous hepatic metastases.

Dilated fluid-filled endometrial canal with a cystic mass within the
uterine fundus/endometrial canal, concerning for recurrence of
endometrial /uterine primary neoplasm.

9 mm left pelvic sidewall lymph node, could represent metastatic
disease, would recommend attention on follow-up exam.
